# Patient Record
Sex: Male | Born: 1975 | Race: Black or African American | Hispanic: No | Marital: Married | Smoking: Current every day smoker
Health system: Southern US, Community
[De-identification: ages and names within clinical notes are randomized; demographics above are authoritative.]

## PROBLEM LIST (undated history)

## (undated) DIAGNOSIS — M549 Dorsalgia, unspecified: Secondary | ICD-10-CM

## (undated) DIAGNOSIS — J45909 Unspecified asthma, uncomplicated: Secondary | ICD-10-CM

## (undated) DIAGNOSIS — G8929 Other chronic pain: Secondary | ICD-10-CM

## (undated) HISTORY — PX: ABSCESS DRAINAGE: SHX1119

---

## 2006-11-16 ENCOUNTER — Emergency Department (HOSPITAL_COMMUNITY): Admission: EM | Admit: 2006-11-16 | Discharge: 2006-11-16 | Payer: Self-pay | Admitting: Emergency Medicine

## 2006-12-09 ENCOUNTER — Emergency Department (HOSPITAL_COMMUNITY): Admission: EM | Admit: 2006-12-09 | Discharge: 2006-12-09 | Payer: Self-pay | Admitting: Emergency Medicine

## 2006-12-19 ENCOUNTER — Emergency Department (HOSPITAL_COMMUNITY): Admission: EM | Admit: 2006-12-19 | Discharge: 2006-12-19 | Payer: Self-pay | Admitting: Emergency Medicine

## 2007-01-08 ENCOUNTER — Emergency Department (HOSPITAL_COMMUNITY): Admission: EM | Admit: 2007-01-08 | Discharge: 2007-01-08 | Payer: Self-pay | Admitting: Emergency Medicine

## 2007-01-16 ENCOUNTER — Emergency Department (HOSPITAL_COMMUNITY): Admission: EM | Admit: 2007-01-16 | Discharge: 2007-01-16 | Payer: Self-pay | Admitting: Emergency Medicine

## 2007-02-01 ENCOUNTER — Emergency Department (HOSPITAL_COMMUNITY): Admission: EM | Admit: 2007-02-01 | Discharge: 2007-02-01 | Payer: Self-pay | Admitting: Emergency Medicine

## 2007-02-07 ENCOUNTER — Emergency Department (HOSPITAL_COMMUNITY): Admission: EM | Admit: 2007-02-07 | Discharge: 2007-02-07 | Payer: Self-pay | Admitting: Emergency Medicine

## 2007-02-09 ENCOUNTER — Emergency Department (HOSPITAL_COMMUNITY): Admission: EM | Admit: 2007-02-09 | Discharge: 2007-02-09 | Payer: Self-pay | Admitting: Emergency Medicine

## 2007-02-12 ENCOUNTER — Emergency Department (HOSPITAL_COMMUNITY): Admission: EM | Admit: 2007-02-12 | Discharge: 2007-02-12 | Payer: Self-pay | Admitting: *Deleted

## 2007-03-08 ENCOUNTER — Emergency Department (HOSPITAL_COMMUNITY): Admission: EM | Admit: 2007-03-08 | Discharge: 2007-03-08 | Payer: Self-pay | Admitting: Emergency Medicine

## 2007-03-23 ENCOUNTER — Emergency Department (HOSPITAL_COMMUNITY): Admission: EM | Admit: 2007-03-23 | Discharge: 2007-03-23 | Payer: Self-pay | Admitting: Emergency Medicine

## 2007-04-17 ENCOUNTER — Emergency Department (HOSPITAL_COMMUNITY): Admission: EM | Admit: 2007-04-17 | Discharge: 2007-04-17 | Payer: Self-pay | Admitting: Emergency Medicine

## 2007-05-17 ENCOUNTER — Emergency Department (HOSPITAL_COMMUNITY): Admission: EM | Admit: 2007-05-17 | Discharge: 2007-05-17 | Payer: Self-pay | Admitting: Emergency Medicine

## 2007-05-30 ENCOUNTER — Emergency Department (HOSPITAL_COMMUNITY): Admission: EM | Admit: 2007-05-30 | Discharge: 2007-05-30 | Payer: Self-pay | Admitting: Emergency Medicine

## 2007-06-15 ENCOUNTER — Emergency Department (HOSPITAL_COMMUNITY): Admission: EM | Admit: 2007-06-15 | Discharge: 2007-06-15 | Payer: Self-pay | Admitting: Emergency Medicine

## 2007-07-06 ENCOUNTER — Emergency Department (HOSPITAL_COMMUNITY): Admission: EM | Admit: 2007-07-06 | Discharge: 2007-07-06 | Payer: Self-pay | Admitting: Emergency Medicine

## 2007-07-25 ENCOUNTER — Emergency Department (HOSPITAL_COMMUNITY): Admission: EM | Admit: 2007-07-25 | Discharge: 2007-07-25 | Payer: Self-pay | Admitting: Emergency Medicine

## 2007-08-26 ENCOUNTER — Emergency Department (HOSPITAL_COMMUNITY): Admission: EM | Admit: 2007-08-26 | Discharge: 2007-08-26 | Payer: Self-pay | Admitting: Emergency Medicine

## 2007-09-13 ENCOUNTER — Emergency Department (HOSPITAL_COMMUNITY): Admission: EM | Admit: 2007-09-13 | Discharge: 2007-09-13 | Payer: Self-pay | Admitting: Emergency Medicine

## 2008-05-09 ENCOUNTER — Emergency Department (HOSPITAL_COMMUNITY): Admission: EM | Admit: 2008-05-09 | Discharge: 2008-05-09 | Payer: Self-pay | Admitting: Emergency Medicine

## 2009-06-17 ENCOUNTER — Emergency Department (HOSPITAL_COMMUNITY): Admission: EM | Admit: 2009-06-17 | Discharge: 2009-06-17 | Payer: Self-pay | Admitting: Family Medicine

## 2010-01-15 ENCOUNTER — Emergency Department (HOSPITAL_COMMUNITY): Admission: EM | Admit: 2010-01-15 | Discharge: 2010-01-15 | Payer: Self-pay | Admitting: Emergency Medicine

## 2010-08-05 ENCOUNTER — Emergency Department (HOSPITAL_COMMUNITY)
Admission: EM | Admit: 2010-08-05 | Discharge: 2010-08-05 | Disposition: A | Discharge status: Home / Self Care | Payer: Self-pay | Attending: Emergency Medicine | Admitting: Emergency Medicine

## 2010-08-05 DIAGNOSIS — IMO0001 Reserved for inherently not codable concepts without codable children: Secondary | ICD-10-CM | POA: Insufficient documentation

## 2010-08-05 DIAGNOSIS — J020 Streptococcal pharyngitis: Secondary | ICD-10-CM | POA: Insufficient documentation

## 2010-12-02 ENCOUNTER — Inpatient Hospital Stay (INDEPENDENT_AMBULATORY_CARE_PROVIDER_SITE_OTHER)
Admission: RE | Admit: 2010-12-02 | Discharge: 2010-12-02 | Disposition: A | Discharge status: Home / Self Care | Payer: Self-pay | Source: Ambulatory Visit | Attending: Family Medicine | Admitting: Family Medicine

## 2010-12-02 DIAGNOSIS — K625 Hemorrhage of anus and rectum: Secondary | ICD-10-CM

## 2011-01-06 ENCOUNTER — Emergency Department (HOSPITAL_COMMUNITY)
Admission: EM | Admit: 2011-01-06 | Discharge: 2011-01-06 | Disposition: A | Discharge status: Home / Self Care | Payer: No Typology Code available for payment source | Attending: Emergency Medicine | Admitting: Emergency Medicine

## 2011-01-06 ENCOUNTER — Emergency Department (HOSPITAL_COMMUNITY): Payer: No Typology Code available for payment source

## 2011-01-06 DIAGNOSIS — M545 Low back pain, unspecified: Secondary | ICD-10-CM | POA: Insufficient documentation

## 2011-01-06 DIAGNOSIS — S335XXA Sprain of ligaments of lumbar spine, initial encounter: Secondary | ICD-10-CM | POA: Insufficient documentation

## 2012-03-02 ENCOUNTER — Encounter (HOSPITAL_COMMUNITY): Payer: Self-pay | Admitting: *Deleted

## 2012-03-02 ENCOUNTER — Emergency Department (HOSPITAL_COMMUNITY)
Admission: EM | Admit: 2012-03-02 | Discharge: 2012-03-02 | Disposition: A | Discharge status: Home / Self Care | Payer: Self-pay | Attending: Emergency Medicine | Admitting: Emergency Medicine

## 2012-03-02 DIAGNOSIS — F17200 Nicotine dependence, unspecified, uncomplicated: Secondary | ICD-10-CM | POA: Insufficient documentation

## 2012-03-02 DIAGNOSIS — G43909 Migraine, unspecified, not intractable, without status migrainosus: Secondary | ICD-10-CM | POA: Insufficient documentation

## 2012-03-02 HISTORY — DX: Unspecified asthma, uncomplicated: J45.909

## 2012-03-02 MED ORDER — PROCHLORPERAZINE MALEATE 10 MG PO TABS
10.0000 mg | ORAL_TABLET | Freq: Once | ORAL | Status: AC
  Administered 2012-03-02: 10 mg via ORAL
  Filled 2012-03-02: qty 1

## 2012-03-02 MED ORDER — PROCHLORPERAZINE MALEATE 10 MG PO TABS: 10.0000 mg | ORAL_TABLET | Freq: Two times a day (BID) | ORAL | Status: DC | PRN

## 2012-03-02 MED ORDER — DIPHENHYDRAMINE HCL 25 MG PO CAPS: 25.0000 mg | ORAL_CAPSULE | Freq: Four times a day (QID) | ORAL | Status: DC | PRN

## 2012-03-02 MED ORDER — DIPHENHYDRAMINE HCL 25 MG PO CAPS
25.0000 mg | ORAL_CAPSULE | Freq: Once | ORAL | Status: AC
  Administered 2012-03-02: 25 mg via ORAL
  Filled 2012-03-02: qty 1

## 2012-03-02 MED ORDER — IBUPROFEN 800 MG PO TABS
800.0000 mg | ORAL_TABLET | Freq: Once | ORAL | Status: AC
  Administered 2012-03-02: 800 mg via ORAL
  Filled 2012-03-02: qty 1

## 2012-03-02 NOTE — ED Notes (Signed)
Pt denies pain after medications. Sts he "feels much better." Pt asleep on stretcher upon RN entering room. Will inform EDP.

## 2012-03-02 NOTE — ED Provider Notes (Signed)
History     CSN: [id number]  Arrival date & time 03/02/12  7829   First MD Initiated Contact with Patient 03/02/12 [phone]      Chief Complaint  Patient presents with  . Headache    (Consider location/radiation/quality/duration/timing/severity/associated sxs/prior treatment) HPIJames D Crawford is a 36 y.o. male with a history of migraine headaches who presented to migraine since early this morning about approximately 3 AM, is currently 8/10, throbbing on the right forehead and behind the right thigh. Is been associated with photophobia, no nausea or vomiting. No neurologic deficits. No changes in vision or blurred vision. No chest pain, fevers or chills, shortness of breath. Patient says this feels like prior migraines. He has had increasing headaches this week.    Past Medical History  Diagnosis Date  . Asthma     History reviewed. No pertinent past surgical history.  No family history on file.  History  Substance Use Topics  . Smoking status: Current Every Day Smoker -- 2.0 packs/day for 7 years    Types: Cigarettes  . Smokeless tobacco: Not on file  . Alcohol Use: No      Review of Systems At least 10pt or greater review of systems completed and are negative except where specified in the HPI.  Allergies  Review of patient's allergies indicates no known allergies.  Home Medications   Current Outpatient Rx  Name  Route  Sig  Dispense  Refill  . ASPIRIN-ACETAMINOPHEN-CAFFEINE 250-250-65 MG PO TABS   Oral   Take 2 tablets by mouth every 6 (six) hours as needed.         . IBUPROFEN 800 MG PO TABS   Oral   Take 800 mg by mouth every 8 (eight) hours as needed. Pain           BP 148/95  Pulse 85  Temp 97.6 F (36.4 C) (Oral)  Resp 16  SpO2 98%  Physical Exam  PHYSICAL EXAM: VITAL SIGNS:  . Filed Vitals:   03/02/12 0915 03/02/12 1244  BP: 148/95 116/70  Pulse: 85 79  Temp: 97.6 F (36.4 C) 98.1 F (36.7 C)  TempSrc: Oral Oral  Resp: 16 20    SpO2: 98% 95%   CONSTITUTIONAL: Awake, oriented, appears non-toxic HENT: Atraumatic, normocephalic, oral mucosa pink and moist, airway patent. Nares patent without drainage. External ears normal. EYES: Conjunctiva clear, EOMI, PERRLA NECK: Trachea midline, non-tender, supple CARDIOVASCULAR: Normal heart rate, Normal rhythm, No murmurs, rubs, gallops PULMONARY/CHEST: Clear to auscultation, no rhonchi, wheezes, or rales. Symmetrical breath sounds. CHEST WALL: No lesions. Non-tender. ABDOMINAL: Non-distended, soft, non-tender - no rebound or guarding.  BS normal. NEUROLOGIC: HY:QMVHQI fields intact. PERRLA, EOMI.  Facial sensation equal to light touch bilaterally.  Good muscle bulk in the masseter muscle and good lateral movement of the jaw.  Facial expressions equal and good strength with smile/frown and puffed cheeks.  Hearing grossly intact to finger rub test.  Uvula, tongue are midline with no deviation. Symmetrical palate elevation.  Trapezius and SCM muscles are 5/5 strength bilaterally.   Strength: 5/5 strength flexors and extensors in the upper and lower extremities.  Grip strength 5/5. Sensation: Sensation intact distally to light touch EXTREMITIES: No clubbing, cyanosis, or edema SKIN: Warm, Dry, No erythema, No rash   ED Course  Procedures (including critical care time)  Labs Reviewed - No data to display No results found.   No diagnosis found.    MDM  Martin Crawford is a 36 y.o. male presents  with typical migraine headaches, however these did not respond to his typical migraine headache medicine-Excedrin. Treated the patient with Benadryl, Compazine, ibuprofen, reevaluated this patient's headache is completely gone. We'll discharge the patient home with Compazine as an abortive therapy.  Do not think this headache is related to any other severe intracranial abnormality such as subarachnoid hemorrhage or CNS infection-headache is most consistent with typical migraines with  this patient, no focal neurologic deficits resolved on therapy.  I explained the diagnosis and have given explicit precautions to return to the ER including any other new or worsening symptoms. The patient understands and accepts the medical plan as it's been dictated and I have answered their questions. Discharge instructions concerning home care and prescriptions have been given.  The patient is STABLE and is discharged to home in good condition.           Jones Skene, MD 03/02/12 1316

## 2012-03-02 NOTE — ED Notes (Signed)
Pt reports headache at 0300, took Excedrin, but medication wore off around 8 am. Headache came back. 4 headaches this week. Throbbing pain in right side of head 7/10. Pt denies blurry vision or dizziness.

## 2012-04-23 ENCOUNTER — Emergency Department (HOSPITAL_COMMUNITY)
Admission: EM | Admit: 2012-04-23 | Discharge: 2012-04-24 | Disposition: A | Discharge status: Home / Self Care | Payer: Self-pay | Attending: Emergency Medicine | Admitting: Emergency Medicine

## 2012-04-23 ENCOUNTER — Encounter (HOSPITAL_COMMUNITY): Payer: Self-pay | Admitting: Emergency Medicine

## 2012-04-23 DIAGNOSIS — R509 Fever, unspecified: Secondary | ICD-10-CM | POA: Insufficient documentation

## 2012-04-23 DIAGNOSIS — J111 Influenza due to unidentified influenza virus with other respiratory manifestations: Secondary | ICD-10-CM | POA: Insufficient documentation

## 2012-04-23 DIAGNOSIS — F17200 Nicotine dependence, unspecified, uncomplicated: Secondary | ICD-10-CM | POA: Insufficient documentation

## 2012-04-23 DIAGNOSIS — J45909 Unspecified asthma, uncomplicated: Secondary | ICD-10-CM | POA: Insufficient documentation

## 2012-04-23 DIAGNOSIS — IMO0001 Reserved for inherently not codable concepts without codable children: Secondary | ICD-10-CM | POA: Insufficient documentation

## 2012-04-23 DIAGNOSIS — J029 Acute pharyngitis, unspecified: Secondary | ICD-10-CM | POA: Insufficient documentation

## 2012-04-23 DIAGNOSIS — R079 Chest pain, unspecified: Secondary | ICD-10-CM | POA: Insufficient documentation

## 2012-04-23 MED ORDER — ACETAMINOPHEN 325 MG PO TABS
650.0000 mg | ORAL_TABLET | Freq: Once | ORAL | Status: AC
  Administered 2012-04-24: 650 mg via ORAL
  Filled 2012-04-23: qty 2

## 2012-04-23 NOTE — ED Notes (Signed)
Pt c/o nausea, sore throat, chills, body aches, cough, fever x 3 days.

## 2012-04-24 ENCOUNTER — Emergency Department (HOSPITAL_COMMUNITY): Payer: Self-pay

## 2012-04-24 MED ORDER — OSELTAMIVIR PHOSPHATE 75 MG PO CAPS
75.0000 mg | ORAL_CAPSULE | Freq: Once | ORAL | Status: AC
  Administered 2012-04-24: 75 mg via ORAL
  Filled 2012-04-24: qty 1

## 2012-04-24 MED ORDER — HYDROCOD POLST-CHLORPHEN POLST 10-8 MG/5ML PO LQCR
5.0000 mL | Freq: Once | ORAL | Status: AC
  Administered 2012-04-24: 5 mL via ORAL
  Filled 2012-04-24: qty 5

## 2012-04-24 MED ORDER — OSELTAMIVIR PHOSPHATE 75 MG PO CAPS: 75.0000 mg | ORAL_CAPSULE | Freq: Two times a day (BID) | ORAL | Status: DC

## 2012-04-24 MED ORDER — HYDROCOD POLST-CHLORPHEN POLST 10-8 MG/5ML PO LQCR: 5.0000 mL | Freq: Two times a day (BID) | ORAL | Status: DC

## 2012-04-24 MED ORDER — IBUPROFEN 200 MG PO TABS
600.0000 mg | ORAL_TABLET | Freq: Once | ORAL | Status: AC
  Administered 2012-04-24: 600 mg via ORAL
  Filled 2012-04-24: qty 3

## 2012-04-24 NOTE — ED Provider Notes (Signed)
History     CSN: [id number]  Arrival date & time 04/23/12  2329   First MD Initiated Contact with Patient 04/24/12 0005      Chief Complaint  Patient presents with  . Influenza    (Consider location/radiation/quality/duration/timing/severity/associated sxs/prior treatment) HPI Comments: 3 days of acute onset myalgia, cough, sore throat fever and chills has been taking OTC nyquil. Without relief   Patient is a 37 y.o. male presenting with flu symptoms. The history is provided by the patient.  Influenza This is a new problem. The current episode started in the past 7 days. The problem occurs constantly. The problem has been unchanged. Associated symptoms include chest pain, chills, coughing, a fever, myalgias and a sore throat. Pertinent negatives include no weakness. Nothing aggravates the symptoms. He has tried nothing for the symptoms. The treatment provided no relief.    Past Medical History  Diagnosis Date  . Asthma     History reviewed. No pertinent past surgical history.  No family history on file.  History  Substance Use Topics  . Smoking status: Current Every Day Smoker -- 2.0 packs/day for 7 years    Types: Cigarettes  . Smokeless tobacco: Not on file  . Alcohol Use: No      Review of Systems  Constitutional: Positive for fever and chills.  HENT: Positive for sore throat.   Respiratory: Positive for cough.   Cardiovascular: Positive for chest pain.  Genitourinary: Negative for dysuria.  Musculoskeletal: Positive for myalgias.  Neurological: Negative for weakness.    Allergies  Review of patient's allergies indicates no known allergies.  Home Medications   Current Outpatient Rx  Name  Route  Sig  Dispense  Refill  . ACETAMINOPHEN 500 MG PO TABS   Oral   Take 2,000 mg by mouth every 6 (six) hours as needed. Pain         . PSEUDOEPH-DOXYLAMINE-DM-APAP 60-12.09-17-998 MG/30ML PO LIQD   Oral   Take 30 mLs by mouth every 6 (six) hours as needed. Flu  symptoms         . HYDROCOD POLST-CPM POLST ER 10-8 MG/5ML PO LQCR   Oral   Take 5 mLs by mouth every 12 (twelve) hours.   140 mL   0   . OSELTAMIVIR PHOSPHATE 75 MG PO CAPS   Oral   Take 1 capsule (75 mg total) by mouth every 12 (twelve) hours.   10 capsule   0     BP 151/88  Pulse 113  Temp 99.6 F (37.6 C) (Oral)  Resp 22  Ht 5\' 9"  (1.753 m)  Wt 240 lb (108.863 kg)  BMI 35.44 kg/m2  SpO2 96%  Physical Exam  Constitutional: He is oriented to person, place, and time. He appears well-developed and well-nourished.  HENT:  Head: Normocephalic.  Mouth/Throat: Oropharynx is clear and moist.  Eyes: Pupils are equal, round, and reactive to light.  Neck: Normal range of motion.  Cardiovascular: Normal rate.   Pulmonary/Chest: Effort normal and breath sounds normal. No respiratory distress. He has no wheezes.  Abdominal: Soft. He exhibits no distension.  Musculoskeletal: Normal range of motion.  Neurological: He is alert and oriented to person, place, and time.  Skin: Skin is warm. No rash noted.    ED Course  Procedures (including critical care time)  Labs Reviewed - No data to display Dg Chest 2 View  04/24/2012  *RADIOLOGY REPORT*  Clinical Data: Shortness of breath, history asthma, smoking  CHEST - 2 VIEW  Comparison: None  Findings: Enlargement of cardiac silhouette. Slight pulmonary vascular congestion. Mediastinal contours normal. Peribronchial thickening without infiltrate, pleural effusion or pneumothorax. No acute osseous findings.  IMPRESSION: Enlargement of cardiac silhouette with pulmonary vascular congestion. Mild to moderate bronchitic changes.   Original Report Authenticated By: Ulyses Southward, M.D.      1. Influenza       MDM   influenza       Arman Filter, NP 04/24/12 1610  Arman Filter, NP 04/24/12 9604

## 2012-04-24 NOTE — ED Provider Notes (Signed)
Medical screening examination/treatment/procedure(s) were performed by non-physician practitioner and as supervising physician I was immediately available for consultation/collaboration.  Juliet Rude. Rubin Payor, MD 04/24/12 [phone]

## 2012-10-26 ENCOUNTER — Emergency Department (HOSPITAL_COMMUNITY)
Admission: EM | Admit: 2012-10-26 | Discharge: 2012-10-26 | Disposition: A | Discharge status: Home / Self Care | Payer: Self-pay | Attending: Emergency Medicine | Admitting: Emergency Medicine

## 2012-10-26 ENCOUNTER — Encounter (HOSPITAL_COMMUNITY): Payer: Self-pay

## 2012-10-26 DIAGNOSIS — Y9389 Activity, other specified: Secondary | ICD-10-CM | POA: Insufficient documentation

## 2012-10-26 DIAGNOSIS — M545 Low back pain, unspecified: Secondary | ICD-10-CM | POA: Insufficient documentation

## 2012-10-26 DIAGNOSIS — S0501XA Injury of conjunctiva and corneal abrasion without foreign body, right eye, initial encounter: Secondary | ICD-10-CM

## 2012-10-26 DIAGNOSIS — H5789 Other specified disorders of eye and adnexa: Secondary | ICD-10-CM | POA: Insufficient documentation

## 2012-10-26 DIAGNOSIS — S058X9A Other injuries of unspecified eye and orbit, initial encounter: Secondary | ICD-10-CM | POA: Insufficient documentation

## 2012-10-26 DIAGNOSIS — X58XXXA Exposure to other specified factors, initial encounter: Secondary | ICD-10-CM | POA: Insufficient documentation

## 2012-10-26 DIAGNOSIS — G8929 Other chronic pain: Secondary | ICD-10-CM | POA: Insufficient documentation

## 2012-10-26 DIAGNOSIS — F17200 Nicotine dependence, unspecified, uncomplicated: Secondary | ICD-10-CM | POA: Insufficient documentation

## 2012-10-26 DIAGNOSIS — J45909 Unspecified asthma, uncomplicated: Secondary | ICD-10-CM | POA: Insufficient documentation

## 2012-10-26 DIAGNOSIS — H53149 Visual discomfort, unspecified: Secondary | ICD-10-CM | POA: Insufficient documentation

## 2012-10-26 DIAGNOSIS — Y929 Unspecified place or not applicable: Secondary | ICD-10-CM | POA: Insufficient documentation

## 2012-10-26 HISTORY — DX: Other chronic pain: G89.29

## 2012-10-26 HISTORY — DX: Dorsalgia, unspecified: M54.9

## 2012-10-26 MED ORDER — PROPARACAINE HCL 0.5 % OP SOLN
1.0000 [drp] | Freq: Once | OPHTHALMIC | Status: AC
  Administered 2012-10-26: 1 [drp] via OPHTHALMIC
  Filled 2012-10-26: qty 15

## 2012-10-26 MED ORDER — CYCLOBENZAPRINE HCL 10 MG PO TABS: 10.0000 mg | ORAL_TABLET | Freq: Two times a day (BID) | ORAL | Status: DC | PRN

## 2012-10-26 MED ORDER — CYCLOBENZAPRINE HCL 10 MG PO TABS
10.0000 mg | ORAL_TABLET | Freq: Once | ORAL | Status: AC
  Administered 2012-10-26: 10 mg via ORAL
  Filled 2012-10-26: qty 1

## 2012-10-26 MED ORDER — FLUORESCEIN SODIUM 1 MG OP STRP
1.0000 | ORAL_STRIP | Freq: Once | OPHTHALMIC | Status: AC
  Administered 2012-10-26: 1 via OPHTHALMIC
  Filled 2012-10-26 (×2): qty 1

## 2012-10-26 MED ORDER — ERYTHROMYCIN 5 MG/GM OP OINT
1.0000 "application " | TOPICAL_OINTMENT | Freq: Once | OPHTHALMIC | Status: AC
  Administered 2012-10-26: 1 via OPHTHALMIC
  Filled 2012-10-26: qty 3.5

## 2012-10-26 NOTE — ED Provider Notes (Signed)
Medical screening examination/treatment/procedure(s) were performed by non-physician practitioner and as supervising physician I was immediately available for consultation/collaboration.  Donnetta Hutching, MD 10/26/12 [phone]

## 2012-10-26 NOTE — Progress Notes (Signed)
[facility] has see patient and provided him with a oc application.

## 2012-10-26 NOTE — ED Provider Notes (Signed)
History    CSN: [id number] Arrival date & time 10/26/12  0808  First MD Initiated Contact with Patient 10/26/12 0840     Chief Complaint  Patient presents with  . Eye Problem  . Back Pain   (Consider location/radiation/quality/duration/timing/severity/associated sxs/prior Treatment) HPI  Martin Crawford is a 37 y.o. male complaining of right eye irritation, watering and photophobia worsening over the course last week. Patient denies decrease in visual acuity, purulent drainage, waking with eyes crusted shut. He states that he can't remember any specific trauma to the eye however he was fishing on the date he first noticed it. Patient also endorses a exacerbation of his chronic low back pain. He denies weakness, numbness, paresthesia, fever, history of IV drug use or cancer, change in bowel or bladder habits.   Past Medical History  Diagnosis Date  . Asthma   . Chronic back pain    History reviewed. No pertinent past surgical history. No family history on file. History  Substance Use Topics  . Smoking status: Current Every Day Smoker -- 2.00 packs/day for 7 years    Types: Cigarettes  . Smokeless tobacco: Not on file  . Alcohol Use: No    Review of Systems  Constitutional:       Negative except as described in HPI  HENT:       Negative except as described in HPI  Respiratory:       Negative except as described in HPI  Cardiovascular:       Negative except as described in HPI  Gastrointestinal:       Negative except as described in HPI  Genitourinary:       Negative except as described in HPI  Musculoskeletal:       Negative except as described in HPI  Skin:       Negative except as described in HPI  Neurological:       Negative except as described in HPI    Allergies  Review of patient's allergies indicates no known allergies.  Home Medications  No current outpatient prescriptions on file. BP 125/85  Pulse 91  Temp(Src) 98.4 F (36.9 C) (Oral)  Resp 16   SpO2 95% Physical Exam  Nursing note and vitals reviewed. Constitutional: He is oriented to person, place, and time. He appears well-developed and well-nourished. No distress.  HENT:  Head: Normocephalic.  Mouth/Throat: Oropharynx is clear and moist.  Eyes: Conjunctivae and EOM are normal. Pupils are equal, round, and reactive to light. No foreign bodies found. Right eye exhibits discharge. Right eye exhibits no chemosis, no exudate and no hordeolum. No foreign body present in the right eye. No scleral icterus. Right eye exhibits normal extraocular motion. Left eye exhibits normal extraocular motion. Right pupil is reactive.    OS 20/25 OD 20/25  OA 20/25  Patient's right eye is injected, there is clear discharge. There is a corneal abrasion at approximately 4:00 on the right eye.  Neck: Normal range of motion.  Cardiovascular: Normal rate, regular rhythm and intact distal pulses.   Pulmonary/Chest: Effort normal and breath sounds normal. No stridor. No respiratory distress. He has no wheezes. He has no rales. He exhibits no tenderness.  Abdominal: Soft. Bowel sounds are normal. He exhibits no distension and no mass. There is no tenderness. There is no rebound and no guarding.  Musculoskeletal: Normal range of motion.  Neurological: He is alert and oriented to person, place, and time.  Skin: Skin is warm.  Psychiatric: He  has a normal mood and affect.    ED Course  Procedures (including critical care time) Labs Reviewed - No data to display No results found.  1. Corneal abrasion, right, initial encounter   2. Low back pain     MDM   Filed Vitals:   10/26/12 0830  BP: 125/85  Pulse: 91  Temp: 98.4 F (36.9 C)  TempSrc: Oral  Resp: 16  SpO2: 95%     Martin Crawford is a 37 y.o. male with exacerbation of chronic low back pain and right sided corneal abrasion. Patient is given erythromycin ointment. Muscle relaxer for the low back pain. He is asked to follow with  ophthalmology for a checkup.  Medications  fluorescein ophthalmic strip 1 strip (not administered)  erythromycin ophthalmic ointment 1 application (not administered)  proparacaine (ALCAINE) 0.5 % ophthalmic solution 1 drop (1 drop Right Eye Given 10/26/12 0917)  cyclobenzaprine (FLEXERIL) tablet 10 mg (10 mg Oral Given 10/26/12 0916)    Pt is hemodynamically stable, appropriate for, and amenable to discharge at this time. Pt verbalized understanding and agrees with care plan. Outpatient follow-up and specific return precautions discussed.    New Prescriptions   CYCLOBENZAPRINE (FLEXERIL) 10 MG TABLET    Take 1 tablet (10 mg total) by mouth 2 (two) times daily as needed for muscle spasms.     Wynetta Emery, PA-C 10/26/12 1003

## 2012-10-26 NOTE — ED Notes (Signed)
Pt c/o redness to rt eye and back pain x1.5 wks, no new injury

## 2012-11-25 ENCOUNTER — Emergency Department (INDEPENDENT_AMBULATORY_CARE_PROVIDER_SITE_OTHER): Payer: Medicaid Other

## 2012-11-25 ENCOUNTER — Emergency Department (INDEPENDENT_AMBULATORY_CARE_PROVIDER_SITE_OTHER)
Admission: EM | Admit: 2012-11-25 | Discharge: 2012-11-25 | Disposition: A | Discharge status: Home / Self Care | Payer: Self-pay | Source: Home / Self Care | Attending: Emergency Medicine | Admitting: Emergency Medicine

## 2012-11-25 ENCOUNTER — Encounter (HOSPITAL_COMMUNITY): Payer: Self-pay | Admitting: Emergency Medicine

## 2012-11-25 DIAGNOSIS — IMO0002 Reserved for concepts with insufficient information to code with codable children: Secondary | ICD-10-CM

## 2012-11-25 DIAGNOSIS — S39012A Strain of muscle, fascia and tendon of lower back, initial encounter: Secondary | ICD-10-CM

## 2012-11-25 DIAGNOSIS — S335XXA Sprain of ligaments of lumbar spine, initial encounter: Secondary | ICD-10-CM

## 2012-11-25 DIAGNOSIS — S76011A Strain of muscle, fascia and tendon of right hip, initial encounter: Secondary | ICD-10-CM

## 2012-11-25 MED ORDER — METHOCARBAMOL 500 MG PO TABS: 500.0000 mg | ORAL_TABLET | Freq: Three times a day (TID) | ORAL | Status: DC

## 2012-11-25 MED ORDER — HYDROCODONE-ACETAMINOPHEN 5-325 MG PO TABS
ORAL_TABLET | ORAL | Status: AC
  Filled 2012-11-25: qty 1

## 2012-11-25 MED ORDER — MELOXICAM 15 MG PO TABS: 15.0000 mg | ORAL_TABLET | Freq: Every day | ORAL | Status: DC

## 2012-11-25 MED ORDER — HYDROCODONE-ACETAMINOPHEN 5-325 MG PO TABS
1.0000 | ORAL_TABLET | Freq: Once | ORAL | Status: AC
  Administered 2012-11-25: 1 via ORAL

## 2012-11-25 MED ORDER — HYDROCODONE-ACETAMINOPHEN 5-325 MG PO TABS: ORAL_TABLET | ORAL | Status: DC

## 2012-11-25 MED ORDER — IBUPROFEN 800 MG PO TABS
800.0000 mg | ORAL_TABLET | Freq: Once | ORAL | Status: AC
  Administered 2012-11-25: 800 mg via ORAL

## 2012-11-25 MED ORDER — IBUPROFEN 800 MG PO TABS
ORAL_TABLET | ORAL | Status: AC
  Filled 2012-11-25: qty 1
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
## 2013-07-21 ENCOUNTER — Encounter (HOSPITAL_COMMUNITY): Payer: Self-pay | Admitting: Emergency Medicine

## 2013-07-21 ENCOUNTER — Emergency Department (HOSPITAL_COMMUNITY)
Admission: EM | Admit: 2013-07-21 | Discharge: 2013-07-22 | Disposition: A | Discharge status: Home / Self Care | Payer: Medicaid Other | Attending: Emergency Medicine | Admitting: Emergency Medicine

## 2013-07-21 DIAGNOSIS — J45909 Unspecified asthma, uncomplicated: Secondary | ICD-10-CM | POA: Insufficient documentation

## 2013-07-21 DIAGNOSIS — S63501A Unspecified sprain of right wrist, initial encounter: Secondary | ICD-10-CM

## 2013-07-21 DIAGNOSIS — Y929 Unspecified place or not applicable: Secondary | ICD-10-CM | POA: Insufficient documentation

## 2013-07-21 DIAGNOSIS — Z791 Long term (current) use of non-steroidal anti-inflammatories (NSAID): Secondary | ICD-10-CM | POA: Insufficient documentation

## 2013-07-21 DIAGNOSIS — Y9389 Activity, other specified: Secondary | ICD-10-CM | POA: Insufficient documentation

## 2013-07-21 DIAGNOSIS — IMO0002 Reserved for concepts with insufficient information to code with codable children: Secondary | ICD-10-CM | POA: Insufficient documentation

## 2013-07-21 DIAGNOSIS — G8929 Other chronic pain: Secondary | ICD-10-CM | POA: Insufficient documentation

## 2013-07-21 DIAGNOSIS — S63509A Unspecified sprain of unspecified wrist, initial encounter: Secondary | ICD-10-CM | POA: Insufficient documentation

## 2013-07-21 DIAGNOSIS — F17200 Nicotine dependence, unspecified, uncomplicated: Secondary | ICD-10-CM | POA: Insufficient documentation

## 2013-07-21 DIAGNOSIS — Z79899 Other long term (current) drug therapy: Secondary | ICD-10-CM | POA: Insufficient documentation

## 2013-07-21 NOTE — ED Notes (Signed)
Pt c/o R hand pain. Pt states he was using a punching bag and "hit it the wrong way.". Pt has slight swelling to R hand. ROM decreased due to pain.

## 2013-07-21 NOTE — ED Provider Notes (Signed)
CSN: [id number]     Arrival date & time 07/21/13  2342 History  This chart was scribed for non-physician practitioner Earley FavorGail Chalee Hirota, NP working with Sunnie NielsenBrian Opitz, MD by Martin Crawford, ED Scribe. This patient was seen in room WTR9/WTR9 and the patient's care was started at 11:57 PM    Chief Complaint  Patient presents with  . Hand Injury   Patient is a 38 y.o. male presenting with hand injury. The history is provided by the patient. No language interpreter was used.  Hand Injury  HPI Comments: Martin Crawford is a 38 y.o. male who presents to the Emergency Department complaining of ongoing R wrist pain that began today.  Pt reports punching a punching bag today and states he punched "it wrong". He reports having limited ROM secondary to pain . Pt denies taking any OTC medications. Pt denies any other injuries.  Past Medical History  Diagnosis Date  . Asthma   . Chronic back pain    History reviewed. No pertinent past surgical history. No family history on file. History  Substance Use Topics  . Smoking status: Current Every Day Smoker -- 2.00 packs/day for 7 years    Types: Cigarettes  . Smokeless tobacco: Not on file  . Alcohol Use: No    Review of Systems  Skin: Negative for color change and wound.  All other systems reviewed and are negative.   Allergies  Review of patient's allergies indicates no known allergies.  Home Medications   Current Outpatient Rx  Name  Route  Sig  Dispense  Refill  . cyclobenzaprine (FLEXERIL) 10 MG tablet   Oral   Take 1 tablet (10 mg total) by mouth 2 (two) times daily as needed for muscle spasms.   20 tablet   0   . HYDROcodone-acetaminophen (NORCO/VICODIN) 5-325 MG per tablet      1 to 2 tabs every 4 to 6 hours as needed for pain.   20 tablet   0   . ibuprofen (ADVIL,MOTRIN) 600 MG tablet   Oral   Take 1 tablet (600 mg total) by mouth every 6 (six) hours as needed.   30 tablet   0   . meloxicam (MOBIC) 15 MG tablet  Oral   Take 1 tablet (15 mg total) by mouth daily.   15 tablet   0   . methocarbamol (ROBAXIN) 500 MG tablet   Oral   Take 1 tablet (500 mg total) by mouth 3 (three) times daily.   30 tablet   0    BP 144/82  Pulse 95  Temp(Src) 98 F (36.7 C) (Oral)  Resp 16  SpO2 97%  Physical Exam  Nursing note and vitals reviewed. Constitutional: He is oriented to person, place, and time. He appears well-developed and well-nourished. No distress.  HENT:  Head: Normocephalic and atraumatic.  Eyes: EOM are normal.  Neck: Neck supple. No tracheal deviation present.  Cardiovascular: Normal rate.   Pulmonary/Chest: Effort normal. No respiratory distress.  Musculoskeletal: Normal range of motion.  Swelling over the distal medial R wrist. Full ROM. Good cap refill. Strong pulses.  Neurological: He is alert and oriented to person, place, and time.  Skin: Skin is warm and dry.  Psychiatric: He has a normal mood and affect. His behavior is normal.    ED Course  Procedures  DIAGNOSTIC STUDIES: Oxygen Saturation is 97% on RA, normal by my interpretation.    COORDINATION OF CARE: 11:59 PM-Discussed treatment plan which includes X-ray and Toradol  shot while in ED. Pt agreed to plan.   Labs Review Labs Reviewed - No data to display Imaging Review Dg Wrist Complete Right  07/22/2013   CLINICAL DATA:  Punching injury while working out.  EXAM: RIGHT WRIST - COMPLETE 3+ VIEW  COMPARISON:  None available for comparison at time of study interpretation.  FINDINGS: No acute fracture deformity or dislocation. Joint space intact without erosions. No destructive bony lesions. Soft tissue planes are not suspicious.  IMPRESSION: Negative.   Electronically Signed   By: Awilda Metro   On: 07/22/2013 00:35     EKG Interpretation None      MDM  Patient's x-ray.  There is no fracture or subluxation.  Patient has been placed in a cock-up splint followup with Dr. Margarite Gouge a lot, as, needed.  He's also been  given a prescription for ibuprofen, I would like you to take a regular basis for the next several days Final diagnoses:  Right wrist sprain      I personally performed the services described in this documentation, which was scribed in my presence. The recorded information has been reviewed and is accurate.   Arman Filter, NP 07/22/13 [phone]

## 2013-07-22 ENCOUNTER — Emergency Department (HOSPITAL_COMMUNITY): Payer: Medicaid Other

## 2013-07-22 MED ORDER — KETOROLAC TROMETHAMINE 30 MG/ML IJ SOLN
30.0000 mg | Freq: Once | INTRAMUSCULAR | Status: AC
  Administered 2013-07-22: 30 mg via INTRAMUSCULAR
  Filled 2013-07-22: qty 1

## 2013-07-22 MED ORDER — IBUPROFEN 600 MG PO TABS: 600.0000 mg | ORAL_TABLET | Freq: Four times a day (QID) | ORAL | Status: DC | PRN

## 2013-07-22 NOTE — Discharge Instructions (Signed)
Joint Sprain A sprain is a tear or stretch in the ligaments that hold a joint together. Severe sprains may need as long as 3-6 weeks of immobilization and/or exercises to heal completely. Sprained joints should be rested and protected. If not, they can become unstable and prone to re-injury. Proper treatment can reduce your pain, shorten the period of disability, and reduce the risk of repeated injuries. TREATMENT   Rest and elevate the injured joint to reduce pain and swelling.  Apply ice packs to the injury for 20-30 minutes every 2-3 hours for the next 2-3 days.  Keep the injury wrapped in a compression bandage or splint as long as the joint is painful or as instructed by your caregiver.  Do not use the injured joint until it is completely healed to prevent re-injury and chronic instability. Follow the instructions of your caregiver.  Long-term sprain management may require exercises and/or treatment by a physical therapist. Taping or special braces may help stabilize the joint until it is completely better. SEEK MEDICAL CARE IF:   You develop increased pain or swelling of the joint.  You develop increasing redness and warmth of the joint.  You develop a fever.  It becomes stiff.  Your hand or foot gets cold or numb. Document Released: 05/15/2004 Document Revised: 06/30/2011 Document Reviewed: 04/24/2008 [facility] Patient Information 2014 [place], [place]. Your x-ray is negative.  Please wear the splint for comfort for the next several days.  Take ibuprofen on a regular basis

## 2013-07-22 NOTE — ED Provider Notes (Signed)
Medical screening examination/treatment/procedure(s) were performed by non-physician practitioner and as supervising physician I was immediately available for consultation/collaboration.   Irving Bloor, MD 07/22/13 2302 

## 2013-08-01 ENCOUNTER — Encounter (HOSPITAL_COMMUNITY): Payer: Self-pay | Admitting: Emergency Medicine

## 2013-08-01 ENCOUNTER — Emergency Department (HOSPITAL_COMMUNITY)
Admission: EM | Admit: 2013-08-01 | Discharge: 2013-08-02 | Disposition: A | Discharge status: Home / Self Care | Payer: Medicaid Other | Attending: Emergency Medicine | Admitting: Emergency Medicine

## 2013-08-01 DIAGNOSIS — R000 Tachycardia, unspecified: Secondary | ICD-10-CM | POA: Insufficient documentation

## 2013-08-01 DIAGNOSIS — G8929 Other chronic pain: Secondary | ICD-10-CM | POA: Insufficient documentation

## 2013-08-01 DIAGNOSIS — F17200 Nicotine dependence, unspecified, uncomplicated: Secondary | ICD-10-CM | POA: Insufficient documentation

## 2013-08-01 DIAGNOSIS — L0501 Pilonidal cyst with abscess: Secondary | ICD-10-CM | POA: Insufficient documentation

## 2013-08-01 DIAGNOSIS — L0591 Pilonidal cyst without abscess: Secondary | ICD-10-CM

## 2013-08-01 DIAGNOSIS — J45909 Unspecified asthma, uncomplicated: Secondary | ICD-10-CM | POA: Insufficient documentation

## 2013-08-01 LAB — BASIC METABOLIC PANEL
BUN: 12 mg/dL (ref 6–23)
CHLORIDE: 96 meq/L (ref 96–112)
CO2: 22 meq/L (ref 19–32)
Calcium: 9.7 mg/dL (ref 8.4–10.5)
Creatinine, Ser: 1.06 mg/dL (ref 0.50–1.35)
GFR calc Af Amer: >90 mL/min (ref >90)
GFR calc non Af Amer: 89 mL/min — ABNORMAL LOW (ref >90)
GLUCOSE: 106 mg/dL — AB (ref 70–99)
Potassium: 3.8 mEq/L (ref 3.7–5.3)
Sodium: 134 mEq/L — ABNORMAL LOW (ref 137–147)

## 2013-08-01 LAB — CBC WITH DIFFERENTIAL/PLATELET
Basophils Absolute: 0 10*3/uL (ref 0.0–0.1)
Basophils Relative: 0 % (ref 0–1)
EOS PCT: 0 % (ref 0–5)
Eosinophils Absolute: 0 10*3/uL (ref 0.0–0.7)
HCT: 42.2 % (ref 39.0–52.0)
Hemoglobin: 15.4 g/dL (ref 13.0–17.0)
LYMPHS ABS: 2.4 10*3/uL (ref 0.7–4.0)
Lymphocytes Relative: 7 % — ABNORMAL LOW (ref 12–46)
MCH: 22.1 pg — AB (ref 26.0–34.0)
MCHC: 36.5 g/dL — ABNORMAL HIGH (ref 30.0–36.0)
MCV: 60.5 fL — AB (ref 78.0–100.0)
MONO ABS: 2.1 10*3/uL — AB (ref 0.1–1.0)
Monocytes Relative: 6 % (ref 3–12)
Neutro Abs: 29.9 10*3/uL — ABNORMAL HIGH (ref 1.7–7.7)
Neutrophils Relative %: 87 % — ABNORMAL HIGH (ref 43–77)
PLATELETS: 179 10*3/uL (ref 150–400)
RBC: 6.98 MIL/uL — AB (ref 4.22–5.81)
RDW: 16 % — AB (ref 11.5–15.5)
WBC: 34.4 10*3/uL — ABNORMAL HIGH (ref 4.0–10.5)

## 2013-08-01 LAB — I-STAT CG4 LACTIC ACID, ED: Lactic Acid, Venous: 1.29 mmol/L (ref 0.5–2.2)

## 2013-08-01 MED ORDER — SODIUM CHLORIDE 0.9 % IV SOLN
1000.0000 mL | Freq: Once | INTRAVENOUS | Status: AC
  Administered 2013-08-01: 1000 mL via INTRAVENOUS

## 2013-08-01 MED ORDER — SODIUM CHLORIDE 0.9 % IV SOLN
1000.0000 mL | INTRAVENOUS | Status: DC
  Administered 2013-08-02: 1000 mL via INTRAVENOUS

## 2013-08-01 MED ORDER — IBUPROFEN 800 MG PO TABS
800.0000 mg | ORAL_TABLET | Freq: Once | ORAL | Status: AC
  Administered 2013-08-01: 800 mg via ORAL
  Filled 2013-08-01: qty 1

## 2013-08-01 MED ORDER — SODIUM CHLORIDE 0.9 % IV BOLUS (SEPSIS): 1000.0000 mL | Freq: Once | INTRAVENOUS | Status: DC

## 2013-08-01 NOTE — ED Notes (Signed)
Lactic Acid given to Dr. Horton. 

## 2013-08-01 NOTE — ED Notes (Signed)
Patient is alert and oriented x3.  He is complaining of abscess that is between his buttocks. He states that it started hurting last Thursday.  Patient has history this issue.  Currently he has a  Temp of 101.7 on arrival to the ED.

## 2013-08-02 ENCOUNTER — Emergency Department (HOSPITAL_COMMUNITY): Payer: Medicaid Other

## 2013-08-02 ENCOUNTER — Encounter (HOSPITAL_COMMUNITY): Payer: Self-pay

## 2013-08-02 LAB — URINALYSIS, ROUTINE W REFLEX MICROSCOPIC
BILIRUBIN URINE: NEGATIVE
GLUCOSE, UA: NEGATIVE mg/dL
HGB URINE DIPSTICK: NEGATIVE
KETONES UR: NEGATIVE mg/dL
Leukocytes, UA: NEGATIVE
Nitrite: NEGATIVE
PH: 6 (ref 5.0–8.0)
Protein, ur: NEGATIVE mg/dL
Specific Gravity, Urine: 1.01 (ref 1.005–1.030)
Urobilinogen, UA: 2 mg/dL — ABNORMAL HIGH (ref 0.0–1.0)

## 2013-08-02 MED ORDER — CLINDAMYCIN HCL 150 MG PO CAPS: 300.0000 mg | ORAL_CAPSULE | Freq: Four times a day (QID) | ORAL | Status: DC

## 2013-08-02 MED ORDER — HYDROMORPHONE HCL PF 1 MG/ML IJ SOLN
1.0000 mg | Freq: Once | INTRAMUSCULAR | Status: AC
Start: 2013-08-02 — End: 2013-08-02
  Administered 2013-08-02: 1 mg via INTRAVENOUS
  Filled 2013-08-02: qty 1

## 2013-08-02 MED ORDER — IOHEXOL 300 MG/ML  SOLN
100.0000 mL | Freq: Once | INTRAMUSCULAR | Status: AC | PRN
  Administered 2013-08-02: 100 mL via INTRAVENOUS

## 2013-08-02 MED ORDER — CLINDAMYCIN PHOSPHATE 600 MG/50ML IV SOLN
600.0000 mg | Freq: Once | INTRAVENOUS | Status: AC
  Administered 2013-08-02: 600 mg via INTRAVENOUS
  Filled 2013-08-02: qty 50

## 2013-08-02 MED ORDER — OXYCODONE-ACETAMINOPHEN 5-325 MG PO TABS: 1.0000 | ORAL_TABLET | Freq: Four times a day (QID) | ORAL | Status: DC | PRN

## 2013-08-02 NOTE — ED Provider Notes (Signed)
CSN: [id number]     Arrival date & time 08/01/13  2206 History   First MD Initiated Contact with Patient 08/01/13 2322     Chief Complaint  Patient presents with  . Abscess     (Consider location/radiation/quality/duration/timing/severity/associated sxs/prior Treatment) HPI  This is a 77 [place] who presents with buttock abscess. 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 Patient states that he noted 3-4 days ago increasing swelling and tenderness just above his gluteal cleft. Over last day and a half he reports fevers at home to 101. Also reports cough. He denies any headache, neck stiffness, shortness of breath, chest pain, abdominal pain, vomiting or diarrhea. He denies any sick contacts. Patient currently rates his pain a 10 out of 10. He has not noted any spontaneous drainage at this time.  Of note, patient noted to be febrile to 101.7 in triage and tachycardic.  Past Medical History  Diagnosis Date  . Asthma   . Chronic back pain    History reviewed. No pertinent past surgical history. History reviewed. No pertinent family history. History  Substance Use Topics  . Smoking status: Current Every Day Smoker -- 2.00 packs/day for 7 years    Types: Cigarettes  . Smokeless tobacco: Not on file  . Alcohol Use: Yes    Review of Systems  Constitutional: Positive for fever and chills.  Respiratory: Positive for cough. Negative for chest tightness and shortness of breath.   Cardiovascular: Negative.  Negative for chest pain.  Gastrointestinal: Negative.  Negative for nausea, vomiting, abdominal pain and diarrhea.  Genitourinary: Negative.  Negative for dysuria.  Musculoskeletal: Negative for back pain.  Skin: Negative for rash.       Abscess  Neurological: Negative for headaches.  All other systems reviewed and are negative.     Allergies  Review of patient's allergies indicates no known allergies.  Home Medications   Current Outpatient Rx   Name  Route  Sig  Dispense  Refill  . ibuprofen (ADVIL,MOTRIN) 600 MG tablet   Oral   Take 1 tablet (600 mg total) by mouth every 6 (six) hours as needed.   30 tablet   0   . clindamycin (CLEOCIN) 150 MG capsule   Oral   Take 2 capsules (300 mg total) by mouth 4 (four) times daily.   28 capsule   0   . oxyCODONE-acetaminophen (PERCOCET/ROXICET) 5-325 MG per tablet   Oral   Take 1 tablet by mouth every 6 (six) hours as needed for severe pain.   20 tablet   0    BP 127/76  Pulse 94  Temp(Src) 97.6 F (36.4 C) (Oral)  Resp 29  SpO2 94% Physical Exam  Nursing note and vitals reviewed. Constitutional: He is oriented to person, place, and time.  Ill-appearing, nontoxic  HENT:  Head: Normocephalic and atraumatic.  Mouth/Throat: Oropharynx is clear and moist.  Eyes: Pupils are equal, round, and reactive to light.  Neck: Neck supple.  Cardiovascular: Regular rhythm and normal heart sounds.   No murmur heard. Tachycardia  Pulmonary/Chest: Effort normal and breath sounds normal. No respiratory distress. He has no wheezes. He has no rales.  Abdominal: Soft. Bowel sounds are normal. There is no tenderness. There is no rebound.  Musculoskeletal: He exhibits no edema.  Lymphadenopathy:    He has no cervical adenopathy.  Neurological: He is alert and oriented to person, place, and time.  Skin: Skin is warm and dry.  Induration noted about the upper gluteal cleft  with tenderness to palpation, no significant erythema noted  Psychiatric: He has a normal mood and affect.    ED Course  INCISION AND DRAINAGE Date/Time: 08/02/2013 3:20 AM Performed by: Ross MarcusHORTON, Lita Flynn, F Authorized by: Ross MarcusHORTON, Jaslyn Bansal, F Consent: Verbal consent obtained. Risks and benefits: risks, benefits and alternatives were discussed Consent given by: patient Patient identity confirmed: verbally with patient Type: pilonidal cyst Location: Buttock. Anesthesia: local infiltration Local anesthetic: lidocaine  1% with epinephrine Anesthetic total: 10 ml Patient sedated: no Scalpel size: 11 Incision type: single straight Complexity: complex Drainage: purulent Drainage amount: copious Wound treatment: wound left open Packing material: 1/2 in iodoform gauze Patient tolerance: Patient tolerated the procedure well with no immediate complications.   (including critical care time) Labs Review Labs Reviewed  CBC WITH DIFFERENTIAL - Abnormal; Notable for the following:    WBC 34.4 (*)    RBC 6.98 (*)    MCV 60.5 (*)    MCH 22.1 (*)    MCHC 36.5 (*)    RDW 16.0 (*)    Neutrophils Relative % 87 (*)    Lymphocytes Relative 7 (*)    Neutro Abs 29.9 (*)    Monocytes Absolute 2.1 (*)    All other components within normal limits  BASIC METABOLIC PANEL - Abnormal; Notable for the following:    Sodium 134 (*)    Glucose, Bld 106 (*)    GFR calc non Af Amer 89 (*)    All other components within normal limits  URINALYSIS, ROUTINE W REFLEX MICROSCOPIC - Abnormal; Notable for the following:    Urobilinogen, UA 2.0 (*)    All other components within normal limits  CULTURE, BLOOD (ROUTINE X 2)  CULTURE, BLOOD (ROUTINE X 2)  URINE CULTURE  I-STAT CG4 LACTIC ACID, ED   Imaging Review Ct Pelvis W Contrast  08/02/2013   CLINICAL DATA:  Buttock abscess  EXAM: CT PELVIS WITH CONTRAST  TECHNIQUE: Multidetector CT imaging of the pelvis was performed using the standard protocol following the bolus administration of intravenous contrast.  CONTRAST:  100mL OMNIPAQUE IOHEXOL 300 MG/ML  SOLN  COMPARISON:  None  FINDINGS: There is a 2.6 x 5.4 cm complex fluid collection with surrounding hazy inflammatory changes within the subcutaneous fat posteriorly overlying the sacrum with a small component inferiorly extending to the sacral cortex without destruction.  There is no other focal fluid collection. There is no hematoma or soft tissue mass. There is mild dilatation of the left distal ureter. The bladder is normal.  There is no pelvic lymphadenopathy. There is no pelvic free fluid. The visualized bowel is normal in caliber. There is no lytic or blastic osseous lesion.  IMPRESSION: 1. 2.6 x 5.4 cm abscess in the posterior subcutaneous fat overlying the sacrum.   Electronically Signed   By: Elige KoHetal  Patel   On: 08/02/2013 01:05   Dg Chest Portable 1 View  08/02/2013   CLINICAL DATA:  Shortness of breath  EXAM: PORTABLE CHEST - 1 VIEW  COMPARISON:  DG CHEST 2 VIEW dated 04/24/2012  FINDINGS: The heart size and mediastinal contours are within normal limits. Both lungs are clear. The visualized skeletal structures are unremarkable.  IMPRESSION: No active disease.   Electronically Signed   By: Elige KoHetal  Patel   On: 08/02/2013 00:16     EKG Interpretation None      MDM   Final diagnoses:  Infected pilonidal cyst    Patient presents with what appears to be pilonidal abscess. No obvious cellulitis. Patient  does have a fever and appears to have a SIRS. Chest x-ray and urinalysis without evidence of infection. White count is 35. Given high white count, will obtain CT scan.  CT scan with contrast shows a 2.6 x 5.4 cm abscess. Lactate is normal. Abscess was drained at the bedside with copious foul-smelling pus.  Given patient's systemic response, he was given clindamycin. Suspect abscess is the source. Abscess was packed. Wife was given wound care instructions and packing instructions. Patient will be referred to surgery clinic. He will be given clindamycin. Patient was given strict return precautions.  After history, exam, and medical workup I feel the patient has been appropriately medically screened and is safe for discharge home. Pertinent diagnoses were discussed with the patient. Patient was given return precautions.    Shon Batonourtney F Tamanna Whitson, MD 08/02/13 [phone]

## 2013-08-02 NOTE — Discharge Instructions (Signed)
Pilonidal Cyst, Care After A pilonidal cyst occurs when hairs get trapped (ingrown) beneath the skin in the crease between the buttocks over your sacrum (the bone under that crease). Pilonidal cysts are most common in young men with a lot of body hair. When the cyst breaks(ruptured) or leaks, fluid from the cyst may cause burning and itching. If the cyst becomes infected, it causes a painful swelling filled with pus (abscess). The pus and trapped hairs need to be removed (often by lancing) so that the infection can heal. The word pilonidal means hair nest. HOME CARE INSTRUCTIONS If the pilonidal sinus was NOT DRAINING OR LANCED:  Keep the area clean and dry. Bathe or shower daily. Wash the area well with a germ-killing soap. Hot tub baths may help prevent infection. Dry the area well with a towel.  Avoid tight clothing in order to keep area as moisture-free as possible.  Keep area between buttocks as free from hair as possible. A depilatory may be used.  Take antibiotics as directed.  Only take over-the-counter or prescription medicines for pain, discomfort, or fever as directed by your caregiver. If the cyst WAS INFECTED AND NEEDED TO BE DRAINED:  Your caregiver may have packed the wound with gauze to keep the wound open. This allows the wound to heal from the inside outward and continue to drain.  Return as directed for a wound check.  If you take tub baths or showers, repack the wound with gauze as directed following. Sponge baths are a good alternative. Sitz baths may be used three to four times a day or as directed.  If an antibiotic was ordered to fight the infection, take as directed.  Only take over-the-counter or prescription medicines for pain, discomfort, or fever as directed by your caregiver.  If a drain was in place and removed, use sitz baths for 20 minutes 4 times per day. Clean the wound gently with mild unscented soap, pat dry, and then apply a dry dressing as  directed. If you had surgery and IT WAS MARSUPIALIZED (LEFT OPEN):  Your wound was packed with gauze to keep the wound open. This allows the wound to heal from the inside outwards and continue draining. The changing of the dressing regularly also helps keep the wound clean.  Return as directed for a wound check.  If you take tub baths or showers, repack the wound with gauze as directed following. Sponge baths are a good alternative. Sitz baths can also be used. This may be done three to four times a day or as directed.  If an antibiotic was ordered to fight the infection, take as directed.  Only take over-the-counter or prescription medicines for pain, discomfort, or fever as directed by your caregiver.  If you had surgery and the wound was closed you may care for it as directed. This generally includes keeping it dry and clean and dressing it as directed. SEEK MEDICAL CARE IF:   You have increased pain, swelling, redness, drainage, or bleeding from the area. Pilonidal Cyst A pilonidal cyst occurs when hairs get trapped (ingrown) beneath the skin in the crease between the buttocks over your sacrum (the bone under that crease). Pilonidal cysts are most common in young men with a lot of body hair. When the cyst is ruptured (breaks) or leaking, fluid from the cyst may cause burning and itching. If the cyst becomes infected, it causes a painful swelling filled with pus (abscess). The pus and trapped hairs need to be removed (  often by lancing) so that the infection can heal. However, recurrence is common and an operation may be needed to remove the cyst. HOME CARE INSTRUCTIONS  If the cyst was NOT INFECTED: Keep the area clean and dry. Bathe or shower daily. Wash the area well with a germ-killing soap. Warm tub baths may help prevent infection and help with drainage. Dry the area well with a towel. Avoid tight clothing to keep area as moisture free as possible. Keep area between buttocks as free of  hair as possible. A depilatory may be used. If the cyst WAS INFECTED and needed to be drained: Your caregiver packed the wound with gauze to keep the wound open. This allows the wound to heal from the inside outwards and continue draining. Return for a wound check in 1 day or as suggested. If you take tub baths or showers, repack the wound with gauze following them. Sponge baths (at the sink) are a good alternative. If an antibiotic was ordered to fight the infection, take as directed. Only take over-the-counter or prescription medicines for pain, discomfort, or fever as directed by your caregiver. After the drain is removed, use sitz baths for 20 minutes 4 times per day. Clean the wound gently with mild unscented soap, pat dry, and then apply a dry dressing. SEEK MEDICAL CARE IF:  You have increased pain, swelling, redness, drainage, or bleeding from the area. You have a fever. You have muscles aches, dizziness, or a general ill feeling. Document Released: 04/04/2000 Document Revised: 06/30/2011 Document Reviewed: 06/02/2008 [facility] Patient Information 2014 [place], [place].   You have a fever.  You have muscles aches, dizziness, or a general ill feeling. Document Released: 05/08/2006 Document Revised: 12/08/2012 Document Reviewed: 07/23/2006 [facility] Patient Information 2014 [place], [place].

## 2013-08-03 LAB — URINE CULTURE
CULTURE: NO GROWTH
Colony Count: NO GROWTH

## 2013-08-08 LAB — CULTURE, BLOOD (ROUTINE X 2)
CULTURE: NO GROWTH
Culture: NO GROWTH

## 2014-06-26 ENCOUNTER — Emergency Department (HOSPITAL_COMMUNITY)
Admission: EM | Admit: 2014-06-26 | Discharge: 2014-06-27 | Disposition: A | Discharge status: Home / Self Care | Payer: Medicaid Other | Attending: Emergency Medicine | Admitting: Emergency Medicine

## 2014-06-26 ENCOUNTER — Encounter (HOSPITAL_COMMUNITY): Payer: Self-pay | Admitting: *Deleted

## 2014-06-26 ENCOUNTER — Emergency Department (HOSPITAL_COMMUNITY): Payer: Medicaid Other

## 2014-06-26 DIAGNOSIS — Y9339 Activity, other involving climbing, rappelling and jumping off: Secondary | ICD-10-CM | POA: Insufficient documentation

## 2014-06-26 DIAGNOSIS — Z792 Long term (current) use of antibiotics: Secondary | ICD-10-CM | POA: Insufficient documentation

## 2014-06-26 DIAGNOSIS — Z791 Long term (current) use of non-steroidal anti-inflammatories (NSAID): Secondary | ICD-10-CM | POA: Insufficient documentation

## 2014-06-26 DIAGNOSIS — Z720 Tobacco use: Secondary | ICD-10-CM | POA: Insufficient documentation

## 2014-06-26 DIAGNOSIS — G8929 Other chronic pain: Secondary | ICD-10-CM | POA: Insufficient documentation

## 2014-06-26 DIAGNOSIS — Y998 Other external cause status: Secondary | ICD-10-CM | POA: Insufficient documentation

## 2014-06-26 DIAGNOSIS — Y9289 Other specified places as the place of occurrence of the external cause: Secondary | ICD-10-CM | POA: Insufficient documentation

## 2014-06-26 DIAGNOSIS — W1830XA Fall on same level, unspecified, initial encounter: Secondary | ICD-10-CM | POA: Insufficient documentation

## 2014-06-26 DIAGNOSIS — S82892A Other fracture of left lower leg, initial encounter for closed fracture: Secondary | ICD-10-CM

## 2014-06-26 DIAGNOSIS — S8255XA Nondisplaced fracture of medial malleolus of left tibia, initial encounter for closed fracture: Secondary | ICD-10-CM | POA: Insufficient documentation

## 2014-06-26 DIAGNOSIS — D72829 Elevated white blood cell count, unspecified: Secondary | ICD-10-CM

## 2014-06-26 DIAGNOSIS — J45909 Unspecified asthma, uncomplicated: Secondary | ICD-10-CM | POA: Insufficient documentation

## 2014-06-26 LAB — CBC WITH DIFFERENTIAL/PLATELET
Basophils Absolute: 0 10*3/uL (ref 0.0–0.1)
Basophils Relative: 0 % (ref 0–1)
EOS ABS: 0.3 10*3/uL (ref 0.0–0.7)
Eosinophils Relative: 1 % (ref 0–5)
HEMATOCRIT: 45.8 % (ref 39.0–52.0)
Hemoglobin: 15.9 g/dL (ref 13.0–17.0)
LYMPHS ABS: 2.8 10*3/uL (ref 0.7–4.0)
LYMPHS PCT: 11 % — AB (ref 12–46)
MCH: 22.1 pg — ABNORMAL LOW (ref 26.0–34.0)
MCHC: 34.7 g/dL (ref 30.0–36.0)
MCV: 63.6 fL — ABNORMAL LOW (ref 78.0–100.0)
MONO ABS: 1.5 10*3/uL — AB (ref 0.1–1.0)
Monocytes Relative: 6 % (ref 3–12)
NEUTROS ABS: 21.2 10*3/uL — AB (ref 1.7–7.7)
Neutrophils Relative %: 82 % — ABNORMAL HIGH (ref 43–77)
Platelets: 219 10*3/uL (ref 150–400)
RBC: 7.2 MIL/uL — ABNORMAL HIGH (ref 4.22–5.81)
RDW: 17.3 % — ABNORMAL HIGH (ref 11.5–15.5)
WBC: 25.8 10*3/uL — ABNORMAL HIGH (ref 4.0–10.5)

## 2014-06-26 LAB — COMPREHENSIVE METABOLIC PANEL
ALT: 25 U/L (ref 0–53)
AST: 22 U/L (ref 0–37)
Albumin: 4.2 g/dL (ref 3.5–5.2)
Alkaline Phosphatase: 88 U/L (ref 39–117)
Anion gap: 9 (ref 5–15)
BUN: 13 mg/dL (ref 6–23)
CALCIUM: 9.4 mg/dL (ref 8.4–10.5)
CO2: 24 mmol/L (ref 19–32)
Chloride: 107 mmol/L (ref 96–112)
Creatinine, Ser: 1.12 mg/dL (ref 0.50–1.35)
GFR, EST NON AFRICAN AMERICAN: 82 mL/min — AB (ref >90)
GLUCOSE: 132 mg/dL — AB (ref 70–99)
Potassium: 4.1 mmol/L (ref 3.5–5.1)
SODIUM: 140 mmol/L (ref 135–145)
Total Bilirubin: 0.6 mg/dL (ref 0.3–1.2)
Total Protein: 8.3 g/dL (ref 6.0–8.3)

## 2014-06-26 MED ORDER — OXYCODONE-ACETAMINOPHEN 5-325 MG PO TABS: 1.0000 | ORAL_TABLET | Freq: Four times a day (QID) | ORAL | Status: DC | PRN

## 2014-06-26 NOTE — ED Notes (Signed)
Pt reports he fell off of a truck, landed on his L foot wrong.  Pt reports L ankle pain.

## 2014-06-26 NOTE — ED Provider Notes (Signed)
CSN: [id number]     Arrival date & time 06/26/14  1925 History   First MD Initiated Contact with Patient 06/26/14 2246     Chief Complaint  Patient presents with  . Fall  . Ankle Injury   HPI Patient presents to the emergency room for evaluation of a left ankle injury. Patient was jumping off a truck and landed on his left foot abnormally. He now has pain on his left ankle. Patient has pain when bearing weight. He denies any numbness or weakness. He denies any knee pain. He denies any other injuries.  While patient was in the waiting room he did have an episode of nausea and vomiting associated with the pain Past Medical History  Diagnosis Date  . Asthma   . Chronic back pain    History reviewed. No pertinent past surgical history. No family history on file. History  Substance Use Topics  . Smoking status: Current Every Day Smoker -- 2.00 packs/day for 7 years    Types: Cigarettes  . Smokeless tobacco: Not on file  . Alcohol Use: Yes    Review of Systems  All other systems reviewed and are negative.     Allergies  Review of patient's allergies indicates no known allergies.  Home Medications   Prior to Admission medications   Medication Sig Start Date End Date Taking? Authorizing Provider  acetaminophen (TYLENOL) 325 MG tablet Take 325 mg by mouth every 6 (six) hours as needed (headache).   Yes Historical Provider, MD  clindamycin (CLEOCIN) 150 MG capsule Take 2 capsules (300 mg total) by mouth 4 (four) times daily. Patient not taking: Reported on 06/26/2014 08/02/13   Shon Batonourtney F Horton, MD  ibuprofen (ADVIL,MOTRIN) 600 MG tablet Take 1 tablet (600 mg total) by mouth every 6 (six) hours as needed. Patient not taking: Reported on 06/26/2014 07/22/13   Earley FavorGail Schulz, NP  oxyCODONE-acetaminophen (PERCOCET/ROXICET) 5-325 MG per tablet Take 1 tablet by mouth every 6 (six) hours as needed for severe pain. 06/26/14   Linwood DibblesJon Tasha Diaz, MD   BP 113/63 mmHg  Pulse 92  Temp(Src) 99.2 F (37.3 C)  (Oral)  Resp 17  SpO2 97% Physical Exam  Constitutional: He appears well-developed and well-nourished. No distress.  HENT:  Head: Normocephalic and atraumatic.  Right Ear: External ear normal.  Left Ear: External ear normal.  Eyes: Conjunctivae are normal. Right eye exhibits no discharge. Left eye exhibits no discharge. No scleral icterus.  Neck: Neck supple. No tracheal deviation present.  Cardiovascular: Normal rate.   Pulmonary/Chest: Effort normal. No stridor. No respiratory distress.  Musculoskeletal: He exhibits no edema.       Left ankle: He exhibits swelling. Tenderness. Lateral malleolus and medial malleolus tenderness found. No head of 5th metatarsal and no proximal fibula tenderness found.  Neurological: He is alert. Cranial nerve deficit: no gross deficits.  Skin: Skin is warm and dry. No rash noted.  Psychiatric: He has a normal mood and affect.  Nursing note and vitals reviewed.   ED Course  Procedures (including critical care time) Labs Review Labs Reviewed  CBC WITH DIFFERENTIAL/PLATELET - Abnormal; Notable for the following:    WBC 25.8 (*)    RBC 7.20 (*)    MCV 63.6 (*)    MCH 22.1 (*)    RDW 17.3 (*)    Neutrophils Relative % 82 (*)    Lymphocytes Relative 11 (*)    Neutro Abs 21.2 (*)    Monocytes Absolute 1.5 (*)    All  other components within normal limits  COMPREHENSIVE METABOLIC PANEL - Abnormal; Notable for the following:    Glucose, Bld 132 (*)    GFR calc non Af Amer 82 (*)    All other components within normal limits  URINALYSIS, ROUTINE W REFLEX MICROSCOPIC    Imaging Review Dg Ankle Complete Left  06/26/2014   CLINICAL DATA:  Status post fall today with ankle swelling and pain both medially and laterally.  EXAM: LEFT ANKLE COMPLETE - 3+ VIEW  COMPARISON:  None.  FINDINGS: There is lucency in the distal tibia suggesting nondisplaced fracture of distal tibia/distal medial malleolus. No other acute fracture dislocation is identified. There is  soft tissue swelling of the medial and lateral ankle, more prominent laterally.  IMPRESSION: Lucency in the distal tibia/ distal medial malleolus suggesting nondisplaced fracture. Soft tissue swelling around the ankle, laterally greater than medially.   Electronically Signed   By: Sherian Rein M.D.   On: 06/26/2014 20:12     EKG Interpretation None      MDM   Final diagnoses:  Ankle fracture, left, closed, initial encounter  Leukocytosis    Patient has a nondisplaced ankle fracture. I will place him in a splint and provided crutches. Rx for pain medications  The patient had laboratory tests drawn in triage because an episode of nausea and vomiting. He has a leukocytosis of 25.8. Previous testing about a year ago he had a white blood cell count of 35.  Previous episode was within infection. It's possible this leukocytosis is related to do margination however the patient does need to have repeat testing. I discussed the importance  of having this retested in about a week or 2 when he is not having any acute medical issues.   Linwood Dibbles, MD 06/26/14 [phone]

## 2014-06-26 NOTE — Discharge Instructions (Signed)
Ankle Fracture  A fracture is a break in a bone. The ankle joint is made up of three bones. These include the lower (distal)sections of your lower leg bones, called the tibia and fibula, along with a bone in your foot, called the talus. Depending on how bad the break is and if more than one ankle joint bone is broken, a cast or splint is used to protect and keep your injured bone from moving while it heals. Sometimes, surgery is required to help the fracture heal properly.   There are two general types of fractures:   Stable fracture. This includes a single fracture line through one bone, with no injury to ankle ligaments. A fracture of the talus that does not have any displacement (movement of the bone on either side of the fracture line) is also stable.   Unstable fracture. This includes more than one fracture line through one or more bones in the ankle joint. It also includes fractures that have displacement of the bone on either side of the fracture line.  CAUSES   A direct blow to the ankle.    Quickly and severely twisting your ankle.   Trauma, such as a car accident or falling from a significant height.  RISK FACTORS  You may be at a higher risk of ankle fracture if:   You have certain medical conditions.   You are involved in high-impact sports.   You are involved in a high-impact car accident.  SIGNS AND SYMPTOMS    Tender and swollen ankle.   Bruising around the injured ankle.   Pain on movement of the ankle.   Difficulty walking or putting weight on the ankle.   A cold foot below the site of the ankle injury. This can occur if the blood vessels passing through your injured ankle were also damaged.   Numbness in the foot below the site of the ankle injury.  DIAGNOSIS   An ankle fracture is usually diagnosed with a physical exam and X-rays. A CT scan may also be required for complex fractures.  TREATMENT   Stable fractures are treated with a cast or splint and using crutches to avoid putting  weight on your injured ankle. This is followed by an ankle strengthening program. Some patients require a special type of cast, depending on other medical problems they may have. Unstable fractures require surgery to ensure the bones heal properly. Your health care provider will tell you what type of fracture you have and the best treatment for your condition.  HOME CARE INSTRUCTIONS    Review correct crutch use with your health care provider and use your crutches as directed. Safe use of crutches is extremely important. Misuse of crutches can cause you to fall or cause injury to nerves in your hands or armpits.   Do not put weight or pressure on the injured ankle until directed by your health care provider.   To lessen the swelling, keep the injured leg elevated while sitting or lying down.   Apply ice to the injured area:   Put ice in a plastic bag.   Place a towel between your cast and the bag.   Leave the ice on for 20 minutes, 2-3 times a day.   If you have a plaster or fiberglass cast:   Do not try to scratch the skin under the cast with any objects. This can increase your risk of skin infection.   Check the skin around the cast every day. You   may put lotion on any red or sore areas.   Keep your cast dry and clean.   If you have a plaster splint:   Wear the splint as directed.   You may loosen the elastic around the splint if your toes become numb, tingle, or turn cold or blue.   Do not put pressure on any part of your cast or splint; it may break. Rest your cast only on a pillow the first 24 hours until it is fully hardened.   Your cast or splint can be protected during bathing with a plastic bag sealed to your skin with medical tape. Do not lower the cast or splint into water.   Take medicines as directed by your health care provider. Only take over-the-counter or prescription medicines for pain, discomfort, or fever as directed by your health care provider.   Do not drive a vehicle until  your health care provider specifically tells you it is safe to do so.   If your health care provider has given you a follow-up appointment, it is very important to keep that appointment. Not keeping the appointment could result in a chronic or permanent injury, pain, and disability. If you have any problem keeping the appointment, call the facility for assistance.  SEEK MEDICAL CARE IF:  You develop increased swelling or discomfort.  SEEK IMMEDIATE MEDICAL CARE IF:    Your cast gets damaged or breaks.   You have continued severe pain.   You develop new pain or swelling after the cast was put on.   Your skin or toenails below the injury turn blue or gray.   Your skin or toenails below the injury feel cold, numb, or have loss of sensitivity to touch.   There is a bad smell or pus draining from under the cast.  MAKE SURE YOU:    Understand these instructions.   Will watch your condition.   Will get help right away if you are not doing well or get worse.  Document Released: 04/04/2000 Document Revised: 04/12/2013 Document Reviewed: 11/04/2012  [employer] Patient Information 2015 [employer]. This information is not intended to replace advice given to you by your health care provider. Make sure you discuss any questions you have with your health care provider.

## 2014-07-28 ENCOUNTER — Emergency Department (INDEPENDENT_AMBULATORY_CARE_PROVIDER_SITE_OTHER)
Admission: EM | Admit: 2014-07-28 | Discharge: 2014-07-28 | Disposition: A | Discharge status: Home / Self Care | Payer: Self-pay | Source: Home / Self Care | Attending: Family Medicine | Admitting: Family Medicine

## 2014-07-28 ENCOUNTER — Encounter (HOSPITAL_COMMUNITY): Payer: Self-pay | Admitting: Emergency Medicine

## 2014-07-28 DIAGNOSIS — IMO0001 Reserved for inherently not codable concepts without codable children: Secondary | ICD-10-CM

## 2014-07-28 DIAGNOSIS — R030 Elevated blood-pressure reading, without diagnosis of hypertension: Secondary | ICD-10-CM

## 2014-07-28 NOTE — Discharge Instructions (Signed)
DASH Eating Plan DASH stands for "Dietary Approaches to Stop Hypertension." The DASH eating plan is a healthy eating plan that has been shown to reduce high blood pressure (hypertension). Additional health benefits may include reducing the risk of type 2 diabetes mellitus, heart disease, and stroke. The DASH eating plan may also help with weight loss. WHAT DO I NEED TO KNOW ABOUT THE DASH EATING PLAN? For the DASH eating plan, you will follow these general guidelines:  Choose foods with a percent daily value for sodium of less than 5% (as listed on the food label).  Use salt-free seasonings or herbs instead of table salt or sea salt.  Check with your health care provider or pharmacist before using salt substitutes.  Eat lower-sodium products, often labeled as "lower sodium" or "no salt added."  Eat fresh foods.  Eat more vegetables, fruits, and low-fat dairy products.  Choose whole grains. Look for the word "whole" as the first word in the ingredient list.  Choose fish and skinless chicken or turkey more often than red meat. Limit fish, poultry, and meat to 6 oz (170 g) each day.  Limit sweets, desserts, sugars, and sugary drinks.  Choose heart-healthy fats.  Limit cheese to 1 oz (28 g) per day.  Eat more home-cooked food and less restaurant, buffet, and fast food.  Limit fried foods.  Cook foods using methods other than frying.  Limit canned vegetables. If you do use them, rinse them well to decrease the sodium.  When eating at a restaurant, ask that your food be prepared with less salt, or no salt if possible. WHAT FOODS CAN I EAT? Seek help from a dietitian for individual calorie needs. Grains Whole grain or whole wheat bread. Brown rice. Whole grain or whole wheat pasta. Quinoa, bulgur, and whole grain cereals. Low-sodium cereals. Corn or whole wheat flour tortillas. Whole grain cornbread. Whole grain crackers. Low-sodium crackers. Vegetables Fresh or frozen vegetables  (raw, steamed, roasted, or grilled). Low-sodium or reduced-sodium tomato and vegetable juices. Low-sodium or reduced-sodium tomato sauce and paste. Low-sodium or reduced-sodium canned vegetables.  Fruits All fresh, canned (in natural juice), or frozen fruits. Meat and Other Protein Products Ground beef (85% or leaner), grass-fed beef, or beef trimmed of fat. Skinless chicken or turkey. Ground chicken or turkey. Pork trimmed of fat. All fish and seafood. Eggs. Dried beans, peas, or lentils. Unsalted nuts and seeds. Unsalted canned beans. Dairy Low-fat dairy products, such as skim or 1% milk, 2% or reduced-fat cheeses, low-fat ricotta or cottage cheese, or plain low-fat yogurt. Low-sodium or reduced-sodium cheeses. Fats and Oils Tub margarines without trans fats. Light or reduced-fat mayonnaise and salad dressings (reduced sodium). Avocado. Safflower, olive, or canola oils. Natural peanut or almond butter. Other Unsalted popcorn and pretzels. The items listed above may not be a complete list of recommended foods or beverages. Contact your dietitian for more options. WHAT FOODS ARE NOT RECOMMENDED? Grains White bread. White pasta. White rice. Refined cornbread. Bagels and croissants. Crackers that contain trans fat. Vegetables Creamed or fried vegetables. Vegetables in a cheese sauce. Regular canned vegetables. Regular canned tomato sauce and paste. Regular tomato and vegetable juices. Fruits Dried fruits. Canned fruit in light or heavy syrup. Fruit juice. Meat and Other Protein Products Fatty cuts of meat. Ribs, chicken wings, bacon, sausage, bologna, salami, chitterlings, fatback, hot dogs, bratwurst, and packaged luncheon meats. Salted nuts and seeds. Canned beans with salt. Dairy Whole or 2% milk, cream, half-and-half, and cream cheese. Whole-fat or sweetened yogurt. Full-fat   cheeses or blue cheese. Nondairy creamers and whipped toppings. Processed cheese, cheese spreads, or cheese  curds. Condiments Onion and garlic salt, seasoned salt, table salt, and sea salt. Canned and packaged gravies. Worcestershire sauce. Tartar sauce. Barbecue sauce. Teriyaki sauce. Soy sauce, including reduced sodium. Steak sauce. Fish sauce. Oyster sauce. Cocktail sauce. Horseradish. Ketchup and mustard. Meat flavorings and tenderizers. Bouillon cubes. Hot sauce. Tabasco sauce. Marinades. Taco seasonings. Relishes. Fats and Oils Butter, stick margarine, lard, shortening, ghee, and bacon fat. Coconut, palm kernel, or palm oils. Regular salad dressings. Other Pickles and olives. Salted popcorn and pretzels. The items listed above may not be a complete list of foods and beverages to avoid. Contact your dietitian for more information. WHERE CAN I FIND MORE INFORMATION? National [employer], [employer], [employer]: www.nhlbi.nih.gov/health/health-topics/topics/dash/ Document Released: 03/27/2011 Document Revised: 08/22/2013 Document Reviewed: 02/09/2013 Exit[employer] Patient Information 2015 [employer]. This information is not intended to replace advice given to you by your health care provider. Make sure you discuss any questions you have with your health care provider. Hypertension Hypertension, commonly called high blood pressure, is when the force of blood pumping through your arteries is too strong. Your arteries are the blood vessels that carry blood from your heart throughout your body. A blood pressure reading consists of a higher number over a lower number, such as 110/72. The higher number (systolic) is the pressure inside your arteries when your heart pumps. The lower number (diastolic) is the pressure inside your arteries when your heart relaxes. Ideally you want your blood pressure below 120/80. Hypertension forces your heart to work harder to pump blood. Your arteries may become narrow or stiff. Having hypertension puts you at risk for heart disease, stroke, and other problems.  RISK  FACTORS Some risk factors for high blood pressure are controllable. Others are not.  Risk factors you cannot control include:   Race. You may be at higher risk if you are African American.  Age. Risk increases with age.  Gender. Men are at higher risk than women before age 45 years. After age 65, women are at higher risk than men. Risk factors you can control include:  Not getting enough exercise or physical activity.  Being overweight.  Getting too much fat, sugar, calories, or salt in your diet.  Drinking too much alcohol. SIGNS AND SYMPTOMS Hypertension does not usually cause signs or symptoms. Extremely high blood pressure (hypertensive crisis) may cause headache, anxiety, shortness of breath, and nosebleed. DIAGNOSIS  To check if you have hypertension, your health care provider will measure your blood pressure while you are seated, with your arm held at the level of your heart. It should be measured at least twice using the same arm. Certain conditions can cause a difference in blood pressure between your right and left arms. A blood pressure reading that is higher than normal on one occasion does not mean that you need treatment. If one blood pressure reading is high, ask your health care provider about having it checked again. TREATMENT  Treating high blood pressure includes making lifestyle changes and possibly taking medicine. Living a healthy lifestyle can help lower high blood pressure. You may need to change some of your habits. Lifestyle changes may include:  Following the DASH diet. This diet is high in fruits, vegetables, and whole grains. It is low in salt, red meat, and added sugars.  Getting at least 2 hours of brisk physical activity every week.  Losing weight if necessary.  Not smoking.  Limiting   alcoholic beverages.  Learning ways to reduce stress. If lifestyle changes are not enough to get your blood pressure under control, your health care provider may  prescribe medicine. You may need to take more than one. Work closely with your health care provider to understand the risks and benefits. HOME CARE INSTRUCTIONS  Have your blood pressure rechecked as directed by your health care provider.   Take medicines only as directed by your health care provider. Follow the directions carefully. Blood pressure medicines must be taken as prescribed. The medicine does not work as well when you skip doses. Skipping doses also puts you at risk for problems.   Do not smoke.   Monitor your blood pressure at home as directed by your health care provider. SEEK MEDICAL CARE IF:   You think you are having a reaction to medicines taken.  You have recurrent headaches or feel dizzy.  You have swelling in your ankles.  You have trouble with your vision. SEEK IMMEDIATE MEDICAL CARE IF:  You develop a severe headache or confusion.  You have unusual weakness, numbness, or feel faint.  You have severe chest or abdominal pain.  You vomit repeatedly.  You have trouble breathing. MAKE SURE YOU:   Understand these instructions.  Will watch your condition.  Will get help right away if you are not doing well or get worse. Document Released: 04/07/2005 Document Revised: 08/22/2013 Document Reviewed: 01/28/2013 Exit[employer] Patient Information 2015 [employer]. This information is not intended to replace advice given to you by your health care provider. Make sure you discuss any questions you have with your health care provider.  

## 2014-07-28 NOTE — ED Notes (Signed)
C/o HTN at home onset Wednesday Denies HA, SOB, weakness, CP Alert, no signs of acute distress.

## 2014-07-28 NOTE — ED Provider Notes (Signed)
CSN: [id number]     Arrival date & time 07/28/14  1222 History   First MD Initiated Contact with Patient 07/28/14 1319     No chief complaint on file.  (Consider location/radiation/quality/duration/timing/severity/associated sxs/prior Treatment) HPI        39 year old male presents for evaluation of elevated blood pressure. He has tried to donate plasma for the past 2 days and has been blood pressure is elevated. He is unsure of exactly what it was at the plasma center. Denies chest pain or shortness of breath. Urinating normally. No history of hypertension  Past Medical History  Diagnosis Date  . Asthma   . Chronic back pain    No past surgical history on file. No family history on file. History  Substance Use Topics  . Smoking status: Current Every Day Smoker -- 2.00 packs/day for 7 years    Types: Cigarettes  . Smokeless tobacco: Not on file  . Alcohol Use: Yes    Review of Systems  Constitutional: Negative for fever, chills and fatigue.  HENT: Negative for sore throat.   Eyes: Negative for visual disturbance.  Respiratory: Negative for cough and shortness of breath.   Cardiovascular: Negative for chest pain, palpitations and leg swelling.  Gastrointestinal: Negative for nausea, vomiting, abdominal pain, diarrhea and constipation.  Genitourinary: Negative for dysuria, urgency, frequency and hematuria.  Musculoskeletal: Negative for myalgias, arthralgias, neck pain and neck stiffness.  Skin: Negative for rash.  Neurological: Negative for dizziness, weakness and light-headedness.  All other systems reviewed and are negative.   Allergies  Review of patient's allergies indicates no known allergies.  Home Medications   Prior to Admission medications   Medication Sig Start Date End Date Taking? Authorizing Provider  acetaminophen (TYLENOL) 325 MG tablet Take 325 mg by mouth every 6 (six) hours as needed (headache).    Historical Provider, MD  clindamycin (CLEOCIN) 150 MG  capsule Take 2 capsules (300 mg total) by mouth 4 (four) times daily. Patient not taking: Reported on 06/26/2014 08/02/13   Shon Batonourtney F Horton, MD  ibuprofen (ADVIL,MOTRIN) 600 MG tablet Take 1 tablet (600 mg total) by mouth every 6 (six) hours as needed. Patient not taking: Reported on 06/26/2014 07/22/13   Earley FavorGail Schulz, NP  oxyCODONE-acetaminophen (PERCOCET/ROXICET) 5-325 MG per tablet Take 1 tablet by mouth every 6 (six) hours as needed for severe pain. 06/26/14   Linwood DibblesJon Knapp, MD   BP 144/84 mmHg  Pulse 94  Temp(Src) 97.6 F (36.4 C) (Oral)  SpO2 95% Physical Exam  Constitutional: He is oriented to person, place, and time. He appears well-developed and well-nourished. No distress.  HENT:  Head: Normocephalic.  Pulmonary/Chest: Effort normal. No respiratory distress.  Neurological: He is alert and oriented to person, place, and time. Coordination normal.  Skin: Skin is warm and dry. No rash noted. He is not diaphoretic.  Psychiatric: He has a normal mood and affect. Judgment normal.  Nursing note and vitals reviewed.   ED Course  Procedures (including critical care time) Labs Review Labs Reviewed - No data to display  Imaging Review No results found.   MDM   1. Elevated blood pressure    The blood pressure is normal here today, rechecked and was mildly elevated. No red flags or emergent symptoms. Follow-up with primary care       Graylon GoodZachary H Sonam Huelsmann, PA-C 07/28/14 1342

## 2014-11-19 ENCOUNTER — Emergency Department (HOSPITAL_COMMUNITY)
Admission: EM | Admit: 2014-11-19 | Discharge: 2014-11-19 | Disposition: A | Discharge status: Home / Self Care | Payer: Self-pay | Attending: Emergency Medicine | Admitting: Emergency Medicine

## 2014-11-19 ENCOUNTER — Encounter (HOSPITAL_COMMUNITY): Payer: Self-pay | Admitting: *Deleted

## 2014-11-19 ENCOUNTER — Emergency Department (HOSPITAL_COMMUNITY): Payer: Self-pay

## 2014-11-19 DIAGNOSIS — J45901 Unspecified asthma with (acute) exacerbation: Secondary | ICD-10-CM | POA: Insufficient documentation

## 2014-11-19 DIAGNOSIS — R000 Tachycardia, unspecified: Secondary | ICD-10-CM | POA: Insufficient documentation

## 2014-11-19 DIAGNOSIS — E860 Dehydration: Secondary | ICD-10-CM | POA: Insufficient documentation

## 2014-11-19 DIAGNOSIS — R42 Dizziness and giddiness: Secondary | ICD-10-CM | POA: Insufficient documentation

## 2014-11-19 DIAGNOSIS — Z720 Tobacco use: Secondary | ICD-10-CM | POA: Insufficient documentation

## 2014-11-19 LAB — CBC
HCT: 51.7 % (ref 39.0–52.0)
Hemoglobin: 18.4 g/dL — ABNORMAL HIGH (ref 13.0–17.0)
MCH: 22.3 pg — ABNORMAL LOW (ref 26.0–34.0)
MCHC: 35.6 g/dL (ref 30.0–36.0)
MCV: 62.7 fL — AB (ref 78.0–100.0)
Platelets: 228 10*3/uL (ref 150–400)
RBC: 8.24 MIL/uL — AB (ref 4.22–5.81)
RDW: 17.5 % — AB (ref 11.5–15.5)
WBC: 17.5 10*3/uL — ABNORMAL HIGH (ref 4.0–10.5)

## 2014-11-19 LAB — BASIC METABOLIC PANEL
Anion gap: 8 (ref 5–15)
BUN: 11 mg/dL (ref 6–20)
CALCIUM: 9.2 mg/dL (ref 8.9–10.3)
CO2: 23 mmol/L (ref 22–32)
Chloride: 109 mmol/L (ref 101–111)
Creatinine, Ser: 1.8 mg/dL — ABNORMAL HIGH (ref 0.61–1.24)
GFR calc non Af Amer: 46 mL/min — ABNORMAL LOW (ref >60)
GFR, EST AFRICAN AMERICAN: 53 mL/min — AB (ref >60)
GLUCOSE: 158 mg/dL — AB (ref 65–99)
POTASSIUM: 3.8 mmol/L (ref 3.5–5.1)
SODIUM: 140 mmol/L (ref 135–145)

## 2014-11-19 LAB — I-STAT TROPONIN, ED: Troponin i, poc: 0.02 ng/mL (ref 0.00–0.08)

## 2014-11-19 MED ORDER — SODIUM CHLORIDE 0.9 % IV BOLUS (SEPSIS)
2000.0000 mL | Freq: Once | INTRAVENOUS | Status: AC
  Administered 2014-11-19: 2000 mL via INTRAVENOUS

## 2014-11-19 NOTE — ED Notes (Signed)
Pt complains of shortness of breath since 445PM today. Pt was outside at a park at the time, pt states he felt short of breath ~30 minutes after smoking a cigarette.

## 2014-11-19 NOTE — ED Provider Notes (Signed)
CSN: [id number]     Arrival date & time 11/19/14  1758 History   First MD Initiated Contact with Patient 11/19/14 1826     Chief Complaint  Patient presents with  . Shortness of Breath  . Dizziness     (Consider location/radiation/quality/duration/timing/severity/associated sxs/prior Treatment) Patient is a 39 y.o. male presenting with shortness of breath and dizziness. The history is provided by the patient and the spouse.  Shortness of Breath Severity:  Severe Onset quality:  Sudden Duration:  2 hours Timing:  Constant Progression:  Partially resolved Chronicity:  New Context: activity and smoke exposure   Context comment:  Plasma donation Relieved by:  Lying down Worsened by:  Nothing tried Ineffective treatments:  None tried Associated symptoms: cough and sputum production   Associated symptoms: no abdominal pain, no chest pain, no fever, no headaches, no rash and no vomiting   Dizziness Associated symptoms: shortness of breath   Associated symptoms: no chest pain, no diarrhea, no headaches, no palpitations and no vomiting    39 yo M with a chief complaint of shortness breath. This started a couple hours ago while he was at a park. Patient donated plasma today as well as has not had anything really to eat or drink all day. Patient was outside in the 96 weather and started feeling lightheaded. Patient smoked a cigarette and then felt like he may pass out associated with some shortness of breath. Patient denies recent long travel denies swollen extremity denies history of cancer denies trauma.  Past Medical History  Diagnosis Date  . Asthma   . Chronic back pain    History reviewed. No pertinent past surgical history. No family history on file. History  Substance Use Topics  . Smoking status: Current Every Day Smoker -- 2.00 packs/day for 7 years    Types: Cigarettes  . Smokeless tobacco: Not on file  . Alcohol Use: Yes    Review of Systems  Constitutional: Negative  for fever and chills.  HENT: Negative for congestion and facial swelling.   Eyes: Negative for discharge and visual disturbance.  Respiratory: Positive for cough, sputum production and shortness of breath.   Cardiovascular: Negative for chest pain and palpitations.  Gastrointestinal: Negative for vomiting, abdominal pain and diarrhea.  Musculoskeletal: Negative for myalgias and arthralgias.  Skin: Negative for color change and rash.  Neurological: Positive for dizziness. Negative for tremors, syncope and headaches.  Psychiatric/Behavioral: Negative for confusion and dysphoric mood.      Allergies  Review of patient's allergies indicates no known allergies.  Home Medications   Prior to Admission medications   Medication Sig Start Date End Date Taking? Authorizing Provider  oxyCODONE-acetaminophen (PERCOCET/ROXICET) 5-325 MG per tablet Take 1 tablet by mouth every 6 (six) hours as needed for severe pain. Patient not taking: Reported on 11/19/2014 06/26/14   Linwood Dibbles, MD   BP 92/58 mmHg  Pulse 84  Temp(Src) 98.2 F (36.8 C) (Oral)  Resp 28  SpO2 94% Physical Exam  Constitutional: He is oriented to person, place, and time. He appears well-developed and well-nourished.  HENT:  Head: Normocephalic and atraumatic.  Eyes: EOM are normal. Pupils are equal, round, and reactive to light.  Neck: Normal range of motion. Neck supple. No JVD present.  Cardiovascular: Regular rhythm.  Tachycardia present.  Exam reveals no gallop and no friction rub.   No murmur heard. Pulmonary/Chest: No respiratory distress. He has no wheezes.  Abdominal: He exhibits no distension. There is no rebound and no guarding.  Musculoskeletal: Normal range of motion.  Neurological: He is alert and oriented to person, place, and time.  Skin: No rash noted. No pallor.  Psychiatric: He has a normal mood and affect. His behavior is normal.    ED Course  Procedures (including critical care time) Labs Review Labs  Reviewed  BASIC METABOLIC PANEL - Abnormal; Notable for the following:    Glucose, Bld 158 (*)    Creatinine, Ser 1.80 (*)    GFR calc non Af Amer 46 (*)    GFR calc Af Amer 53 (*)    All other components within normal limits  CBC - Abnormal; Notable for the following:    WBC 17.5 (*)    RBC 8.24 (*)    Hemoglobin 18.4 (*)    MCV 62.7 (*)    MCH 22.3 (*)    RDW 17.5 (*)    All other components within normal limits  I-STAT TROPOININ, ED    Imaging Review Dg Chest 2 View  11/19/2014   CLINICAL DATA:  Shortness of breath, dizziness  EXAM: CHEST  2 VIEW  COMPARISON:  08/02/2013  FINDINGS: Lungs are clear.  No pleural effusion or pneumothorax.  The heart is normal in size.  Visualized osseous structures are within normal limits.  IMPRESSION: No evidence of acute cardiopulmonary disease.   Electronically Signed   By: Charline Bills M.D.   On: 11/19/2014 18:41     EKG Interpretation None      MDM   Final diagnoses:  Dehydration    39 yo M with a chief complaint of shortness of breath. Patient also had a near syncopal event. Per family he donated plasma today and then was outside all day for his daughter's birthday party. Likely symptomatic due to hypovolemia patient is normal temperature. Blood work ordered as triage orders. Troponin negative. Suspect secondary to hypovolemia will give 2 L fluid and then reassess. Feel the patient is low risk for PE. Patient has no chest pain.  Labwork unremarkable patient's symptoms completely resolved with 2 L of fluids. Patient with normal vital signs on recheck. Document respiratory rate is 28 however in the region counting patient's respiratory rate is 16. 8:59 PM:  I have discussed the diagnosis/risks/treatment options with the patient and family and believe the pt to be eligible for discharge home to follow-up with PCP. We also discussed returning to the ED immediately if new or worsening sx occur. We discussed the sx which are most concerning  (e.g., syncope, worsening sob) that necessitate immediate return. Medications administered to the patient during their visit and any new prescriptions provided to the patient are listed below.  Medications given during this visit Medications  sodium chloride 0.9 % bolus 2,000 mL (2,000 mLs Intravenous New Bag/Given 11/19/14 1949)    New Prescriptions   No medications on file     The patient appears reasonably screen and/or stabilized for discharge and I doubt any other medical condition or other [facility] requiring further screening, evaluation, or treatment in the ED at this time prior to discharge.    Melene Plan, DO 11/19/14 2059

## 2014-11-19 NOTE — Discharge Instructions (Signed)
Dehydration, Adult Dehydration is when you lose more fluids from the body than you take in. Vital organs like the kidneys, brain, and heart cannot function without a proper amount of fluids and salt. Any loss of fluids from the body can cause dehydration.  CAUSES   Vomiting.  Diarrhea.  Excessive sweating.  Excessive urine output.  Fever. SYMPTOMS  Mild dehydration  Thirst.  Dry lips.  Slightly dry mouth. Moderate dehydration  Very dry mouth.  Sunken eyes.  Skin does not bounce back quickly when lightly pinched and released.  Dark urine and decreased urine production.  Decreased tear production.  Headache. Severe dehydration  Very dry mouth.  Extreme thirst.  Rapid, weak pulse (more than 100 beats per minute at rest).  Cold hands and feet.  Not able to sweat in spite of heat and temperature.  Rapid breathing.  Blue lips.  Confusion and lethargy.  Difficulty being awakened.  Minimal urine production.  No tears. DIAGNOSIS  Your caregiver will diagnose dehydration based on your symptoms and your exam. Blood and urine tests will help confirm the diagnosis. The diagnostic evaluation should also identify the cause of dehydration. TREATMENT  Treatment of mild or moderate dehydration can often be done at home by increasing the amount of fluids that you drink. It is best to drink small amounts of fluid more often. Drinking too much at one time can make vomiting worse. Refer to the home care instructions below. Severe dehydration needs to be treated at the hospital where you will probably be given intravenous (IV) fluids that contain water and electrolytes. HOME CARE INSTRUCTIONS   Ask your caregiver about specific rehydration instructions.  Drink enough fluids to keep your urine clear or pale yellow.  Drink small amounts frequently if you have nausea and vomiting.  Eat as you normally do.  Avoid:  Foods or drinks high in sugar.  Carbonated  drinks.  Juice.  Extremely hot or cold fluids.  Drinks with caffeine.  Fatty, greasy foods.  Alcohol.  Tobacco.  Overeating.  Gelatin desserts.  Wash your hands well to avoid spreading bacteria and viruses.  Only take over-the-counter or prescription medicines for pain, discomfort, or fever as directed by your caregiver.  Ask your caregiver if you should continue all prescribed and over-the-counter medicines.  Keep all follow-up appointments with your caregiver. SEEK MEDICAL CARE IF:  You have abdominal pain and it increases or stays in one area (localizes).  You have a rash, stiff neck, or severe headache.  You are irritable, sleepy, or difficult to awaken.  You are weak, dizzy, or extremely thirsty. SEEK IMMEDIATE MEDICAL CARE IF:   You are unable to keep fluids down or you get worse despite treatment.  You have frequent episodes of vomiting or diarrhea.  You have blood or green matter (bile) in your vomit.  You have blood in your stool or your stool looks black and tarry.  You have not urinated in 6 to 8 hours, or you have only urinated a small amount of very dark urine.  You have a fever.  You faint. MAKE SURE YOU:   Understand these instructions.  Will watch your condition.  Will get help right away if you are not doing well or get worse. Document Released: 04/07/2005 Document Revised: 06/30/2011 Document Reviewed: 11/25/2010 [employer] Patient Information 2015 [employer]. This information is not intended to replace advice given to you by your health care provider. Make sure you discuss any questions you have with your health care   provider.  

## 2015-04-19 ENCOUNTER — Encounter (HOSPITAL_COMMUNITY): Payer: Self-pay | Admitting: *Deleted

## 2015-04-19 ENCOUNTER — Emergency Department (HOSPITAL_COMMUNITY)
Admission: EM | Admit: 2015-04-19 | Discharge: 2015-04-20 | Disposition: A | Discharge status: Home / Self Care | Payer: Self-pay | Attending: Emergency Medicine | Admitting: Emergency Medicine

## 2015-04-19 ENCOUNTER — Emergency Department (HOSPITAL_COMMUNITY): Payer: Self-pay

## 2015-04-19 DIAGNOSIS — Z79899 Other long term (current) drug therapy: Secondary | ICD-10-CM | POA: Insufficient documentation

## 2015-04-19 DIAGNOSIS — G8929 Other chronic pain: Secondary | ICD-10-CM | POA: Insufficient documentation

## 2015-04-19 DIAGNOSIS — J45901 Unspecified asthma with (acute) exacerbation: Secondary | ICD-10-CM | POA: Insufficient documentation

## 2015-04-19 DIAGNOSIS — F17210 Nicotine dependence, cigarettes, uncomplicated: Secondary | ICD-10-CM | POA: Insufficient documentation

## 2015-04-19 DIAGNOSIS — R0789 Other chest pain: Secondary | ICD-10-CM | POA: Insufficient documentation

## 2015-04-19 DIAGNOSIS — M25511 Pain in right shoulder: Secondary | ICD-10-CM | POA: Insufficient documentation

## 2015-04-19 MED ORDER — DIAZEPAM 5 MG PO TABS
10.0000 mg | ORAL_TABLET | Freq: Once | ORAL | Status: AC
  Administered 2015-04-19: 10 mg via ORAL
  Filled 2015-04-19: qty 2

## 2015-04-19 NOTE — ED Notes (Signed)
Pt c/o rt sided shoulder pain to the scapula area x 2 days; pt states that he feels short of breath; pt states that the pain increases with movement and becomes more short of breath with exertion; pt denies injury to shoulder area

## 2015-04-19 NOTE — ED Provider Notes (Signed)
CSN: [id number]     Arrival date & time 04/19/15  2203 History   First MD Initiated Contact with Patient 04/19/15 2236     Chief Complaint  Patient presents with  . Shortness of Breath  . Shoulder Pain     (Consider location/radiation/quality/duration/timing/severity/associated sxs/prior Treatment) Patient is a 39 y.o. male presenting with shortness of breath and shoulder pain. The history is provided by the patient and a significant other. No language interpreter was used.  Shortness of Breath Severity:  Moderate Onset quality:  Sudden Duration:  2 days Timing:  Intermittent Progression:  Waxing and waning Chronicity:  New Context: activity   Associated symptoms: no abdominal pain   Associated symptoms comment:  Right shoulder pain Shoulder Pain   Past Medical History  Diagnosis Date  . Asthma   . Chronic back pain    History reviewed. No pertinent past surgical history. No family history on file. Social History  Substance Use Topics  . Smoking status: Current Every Day Smoker -- 2.00 packs/day for 7 years    Types: Cigarettes  . Smokeless tobacco: None  . Alcohol Use: Yes    Review of Systems  Respiratory: Positive for shortness of breath.   Gastrointestinal: Negative for abdominal pain.  Musculoskeletal: Positive for myalgias.  All other systems reviewed and are negative.     Allergies  Review of patient's allergies indicates no known allergies.  Home Medications   Prior to Admission medications   Medication Sig Start Date End Date Taking? Authorizing Provider  acetaminophen (TYLENOL) 500 MG tablet Take 1,000 mg by mouth every 6 (six) hours as needed for mild pain.   Yes Historical Provider, MD  Menthol, Topical Analgesic, 5 % PADS Apply 1 each topically daily.   Yes Historical Provider, MD  oxyCODONE-acetaminophen (PERCOCET/ROXICET) 5-325 MG per tablet Take 1 tablet by mouth every 6 (six) hours as needed for severe pain. Patient not taking: Reported on  11/19/2014 06/26/14   Linwood Dibbles, MD   BP 124/78 mmHg  Pulse 89  Temp(Src) 98.1 F (36.7 C) (Oral)  Resp 20  Ht  (1.753 m)  Wt 101.152 kg  BMI 32.92 kg/m2  SpO2 97% Physical Exam  Constitutional: He is oriented to person, place, and time. He appears well-developed and well-nourished.  HENT:  Head: Normocephalic.  Eyes: Conjunctivae are normal.  Neck: Neck supple.  Cardiovascular: Normal rate and regular rhythm.   Pulmonary/Chest: Effort normal.  Abdominal: Soft. Bowel sounds are normal.  Musculoskeletal: He exhibits tenderness. He exhibits no edema.       Arms: Lymphadenopathy:    He has no cervical adenopathy.  Neurological: He is alert and oriented to person, place, and time.  Skin: Skin is warm and dry.  Psychiatric: He has a normal mood and affect.  Nursing note and vitals reviewed.   ED Course  Procedures (including critical care time) Labs Review Labs Reviewed - No data to display  Imaging Review Dg Chest 2 View  04/19/2015  CLINICAL DATA:  Initial encounter for increasing chest pain over the last 48 hours with worsening shortness of breath for the last several hours. EXAM: CHEST  2 VIEW COMPARISON:  11/19/2014. FINDINGS: Two-view exam shows hyperexpansion. Cardiopericardial silhouette is at upper limits of normal for size. Basilar interstitial and alveolar opacity without dense focal airspace consolidation. No pleural effusion. Imaged bony structures of the thorax are intact. IMPRESSION: Interstitial and alveolar opacity at the lung bases bilaterally. Imaging features may be related to an atypical infectious or  inflammatory process Electronically Signed   By: Kennith CenterEric  Mansell M.D.   On: 04/19/2015 23:24   I have personally reviewed and evaluated these images and lab results as part of my medical decision-making.   EKG Interpretation None     Chest wall pain. Improved after diazepam in ED. Chest xray results reviewed and shared with patient.  Inflammatory changes  noted.  Patient without fever or cough.  Will recommend follow-up with pulmonology. MDM   Final diagnoses:  None    Chest wall pain. Care instructions provided. Return precautions discussed.    Felicie Mornavid Nickisha Hum, NP 04/20/15 [id number]  Derwood KaplanAnkit Nanavati, MD 04/20/15 [phone]

## 2015-04-20 MED ORDER — NAPROXEN 500 MG PO TABS: 500.0000 mg | ORAL_TABLET | Freq: Two times a day (BID) | ORAL | Status: DC

## 2015-04-20 MED ORDER — CYCLOBENZAPRINE HCL 10 MG PO TABS: 10.0000 mg | ORAL_TABLET | Freq: Two times a day (BID) | ORAL | Status: DC | PRN

## 2015-04-20 NOTE — Discharge Instructions (Signed)

## 2015-08-21 ENCOUNTER — Encounter (HOSPITAL_COMMUNITY): Payer: Self-pay | Admitting: Emergency Medicine

## 2015-08-21 ENCOUNTER — Emergency Department (HOSPITAL_COMMUNITY)
Admission: EM | Admit: 2015-08-21 | Discharge: 2015-08-21 | Disposition: A | Discharge status: Home / Self Care | Payer: No Typology Code available for payment source | Attending: Emergency Medicine | Admitting: Emergency Medicine

## 2015-08-21 ENCOUNTER — Emergency Department (HOSPITAL_COMMUNITY): Payer: No Typology Code available for payment source

## 2015-08-21 DIAGNOSIS — S80211A Abrasion, right knee, initial encounter: Secondary | ICD-10-CM | POA: Insufficient documentation

## 2015-08-21 DIAGNOSIS — Y998 Other external cause status: Secondary | ICD-10-CM | POA: Diagnosis not present

## 2015-08-21 DIAGNOSIS — F17210 Nicotine dependence, cigarettes, uncomplicated: Secondary | ICD-10-CM | POA: Diagnosis not present

## 2015-08-21 DIAGNOSIS — Z79899 Other long term (current) drug therapy: Secondary | ICD-10-CM | POA: Diagnosis not present

## 2015-08-21 DIAGNOSIS — Y9389 Activity, other specified: Secondary | ICD-10-CM | POA: Diagnosis not present

## 2015-08-21 DIAGNOSIS — J45909 Unspecified asthma, uncomplicated: Secondary | ICD-10-CM | POA: Diagnosis not present

## 2015-08-21 DIAGNOSIS — Y92410 Unspecified street and highway as the place of occurrence of the external cause: Secondary | ICD-10-CM | POA: Insufficient documentation

## 2015-08-21 DIAGNOSIS — G8929 Other chronic pain: Secondary | ICD-10-CM | POA: Insufficient documentation

## 2015-08-21 DIAGNOSIS — M791 Myalgia, unspecified site: Secondary | ICD-10-CM

## 2015-08-21 DIAGNOSIS — M79675 Pain in left toe(s): Secondary | ICD-10-CM

## 2015-08-21 DIAGNOSIS — S3992XA Unspecified injury of lower back, initial encounter: Secondary | ICD-10-CM | POA: Diagnosis not present

## 2015-08-21 DIAGNOSIS — Z791 Long term (current) use of non-steroidal anti-inflammatories (NSAID): Secondary | ICD-10-CM | POA: Diagnosis not present

## 2015-08-21 DIAGNOSIS — S99922A Unspecified injury of left foot, initial encounter: Secondary | ICD-10-CM | POA: Insufficient documentation

## 2015-08-21 MED ORDER — CYCLOBENZAPRINE HCL 10 MG PO TABS: 10.0000 mg | ORAL_TABLET | Freq: Two times a day (BID) | ORAL | Status: DC | PRN

## 2015-08-21 MED ORDER — TRAMADOL HCL 50 MG PO TABS: 50.0000 mg | ORAL_TABLET | Freq: Four times a day (QID) | ORAL | Status: DC | PRN

## 2015-08-21 NOTE — Discharge Instructions (Signed)
Muscle Pain, Adult Muscle pain (myalgia) may be caused by many things, including:  Overuse or muscle strain, especially if you are not in shape. This is the most common cause of muscle pain.  Injury.  Bruises.  Viruses, such as the flu.  Infectious diseases.  Fibromyalgia, which is a chronic condition that causes muscle tenderness, fatigue, and headache.  Autoimmune diseases, including lupus.  Certain drugs, including ACE inhibitors and statins. Muscle pain may be mild or severe. In most cases, the pain lasts only a short time and goes away without treatment. To diagnose the cause of your muscle pain, your health care provider will take your medical history. This means he or she will ask you when your muscle pain began and what has been happening. If you have not had muscle pain for very long, your health care provider may want to wait before doing much testing. If your muscle pain has lasted a long time, your health care provider may want to run tests right away. If your health care provider thinks your muscle pain may be caused by illness, you may need to have additional tests to rule out certain conditions.  Treatment for muscle pain depends on the cause. Home care is often enough to relieve muscle pain. Your health care provider may also prescribe anti-inflammatory medicine. HOME CARE INSTRUCTIONS Watch your condition for any changes. The following actions may help to lessen any discomfort you are feeling:  Only take over-the-counter or prescription medicines as directed by your health care provider.  Apply ice to the sore muscle:  Put ice in a plastic bag.  Place a towel between your skin and the bag.  Leave the ice on for 15-20 minutes, 3-4 times a day.  You may alternate applying hot and cold packs to the muscle as directed by your health care provider.  If overuse is causing your muscle pain, slow down your activities until the pain goes away.  Remember that it is normal  to feel some muscle pain after starting a workout program. Muscles that have not been used often will be sore at first.  Do regular, gentle exercises if you are not usually active.  Warm up before exercising to lower your risk of muscle pain.  Do not continue working out if the pain is very bad. Bad pain could mean you have injured a muscle. SEEK MEDICAL CARE IF:  Your muscle pain gets worse, and medicines do not help.  You have muscle pain that lasts longer than 3 days.  You have a rash or fever along with muscle pain.  You have muscle pain after a tick bite.  You have muscle pain while working out, even though you are in good physical condition.  You have redness, soreness, or swelling along with muscle pain.  You have muscle pain after starting a new medicine or changing the dose of a medicine. SEEK IMMEDIATE MEDICAL CARE IF:  You have trouble breathing.  You have trouble swallowing.  You have muscle pain along with a stiff neck, fever, and vomiting.  You have severe muscle weakness or cannot move part of your body. MAKE SURE YOU:   Understand these instructions.  Will watch your condition.  Will get help right away if you are not doing well or get worse.   This information is not intended to replace advice given to you by your health care provider. Make sure you discuss any questions you have with your health care provider.  Follow up with  [facility] if your symptoms do not improve. Take flexeril and tramadol as needed for pain. Wear post op shoe for support. Return to the ED if you experience severe worsening of your symptoms, loss of consciousness, blurry vision, abdominal pain, loss of control of your bowel or bladder, numbness or tingling in an extremity.

## 2015-08-21 NOTE — ED Notes (Signed)
Pt states that he fell off of his scooter yesterday. Now c/o lower back pain and L great toe pain. Alert and oriented.

## 2015-08-21 NOTE — ED Notes (Signed)
Pt named called twice to be roomed, no answer
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
## 2015-08-21 NOTE — ED Notes (Signed)
PT DISCHARGED. INSTRUCTIONS AND PRESCRIPTIONS GIVEN. AAOX3. PT IN NO APPARENT DISTRESS. THE OPPORTUNITY TO ASK QUESTIONS WAS PROVIDED. 

## 2015-09-04 ENCOUNTER — Ambulatory Visit (HOSPITAL_COMMUNITY)
Admission: EM | Admit: 2015-09-04 | Discharge: 2015-09-04 | Disposition: A | Discharge status: Home / Self Care | Payer: Managed Care, Other (non HMO) | Attending: Family Medicine | Admitting: Family Medicine

## 2015-09-04 ENCOUNTER — Encounter (HOSPITAL_COMMUNITY): Payer: Self-pay | Admitting: Emergency Medicine

## 2015-09-04 DIAGNOSIS — S99922D Unspecified injury of left foot, subsequent encounter: Secondary | ICD-10-CM

## 2015-09-04 MED ORDER — OXYCODONE-ACETAMINOPHEN 5-325 MG PO TABS: 1.0000 | ORAL_TABLET | ORAL | Status: DC | PRN

## 2015-09-04 NOTE — ED Notes (Signed)
No answer in lobby.
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
## 2015-09-04 NOTE — Discharge Instructions (Signed)
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
## 2015-11-11 ENCOUNTER — Emergency Department (HOSPITAL_COMMUNITY)
Admission: EM | Admit: 2015-11-11 | Discharge: 2015-11-11 | Disposition: A | Discharge status: Home / Self Care | Payer: Managed Care, Other (non HMO) | Attending: Emergency Medicine | Admitting: Emergency Medicine

## 2015-11-11 DIAGNOSIS — J45909 Unspecified asthma, uncomplicated: Secondary | ICD-10-CM | POA: Insufficient documentation

## 2015-11-11 DIAGNOSIS — F17200 Nicotine dependence, unspecified, uncomplicated: Secondary | ICD-10-CM | POA: Insufficient documentation

## 2015-11-11 DIAGNOSIS — L0501 Pilonidal cyst with abscess: Secondary | ICD-10-CM | POA: Insufficient documentation

## 2015-11-11 DIAGNOSIS — Z79899 Other long term (current) drug therapy: Secondary | ICD-10-CM | POA: Insufficient documentation

## 2015-11-11 MED ORDER — HYDROMORPHONE HCL 1 MG/ML IJ SOLN
2.0000 mg | Freq: Once | INTRAMUSCULAR | Status: AC
  Administered 2015-11-11: 2 mg via INTRAMUSCULAR
  Filled 2015-11-11: qty 2

## 2015-11-11 MED ORDER — SULFAMETHOXAZOLE-TRIMETHOPRIM 800-160 MG PO TABS: 1.0000 | ORAL_TABLET | Freq: Two times a day (BID) | ORAL | 0 refills | Status: AC

## 2015-11-11 MED ORDER — ONDANSETRON 4 MG PO TBDP
8.0000 mg | ORAL_TABLET | Freq: Once | ORAL | Status: AC
  Administered 2015-11-11: 8 mg via ORAL
  Filled 2015-11-11: qty 2

## 2015-11-11 MED ORDER — OXYCODONE-ACETAMINOPHEN 5-325 MG PO TABS: 1.0000 | ORAL_TABLET | ORAL | 0 refills | Status: DC | PRN

## 2015-11-11 MED ORDER — LIDOCAINE-EPINEPHRINE (PF) 2 %-1:200000 IJ SOLN
20.0000 mL | Freq: Once | INTRAMUSCULAR | Status: AC
  Administered 2015-11-11: 20 mL
  Filled 2015-11-11: qty 20

## 2015-11-11 NOTE — ED Provider Notes (Signed)
[facility]-EMERGENCY DEPT Provider Note   CSN: [id number] Arrival date & time: 11/11/15  0144  First Provider Contact:  None       History   Chief Complaint No chief complaint on file.   HPI Martin Crawford is a 40 y.o. male.  The history is provided by the patient.     Patient presents with pain and swelling over his buttock that began 5 days ago.  Associated subjective fever, chills.  Has had this previously 9 months ago and was I&D at that time.     Past Medical History:  Diagnosis Date  . Asthma   . Chronic back pain     There are no active problems to display for this patient.   No past surgical history on file.     Home Medications    Prior to Admission medications   Medication Sig Start Date End Date Taking? Authorizing Provider  acetaminophen (TYLENOL) 500 MG tablet Take 1,000 mg by mouth every 6 (six) hours as needed for mild pain.    Historical Provider, MD  cyclobenzaprine (FLEXERIL) 10 MG tablet Take 1 tablet (10 mg total) by mouth 2 (two) times daily as needed for muscle spasms. 04/20/15   Felicie Morn, NP  cyclobenzaprine (FLEXERIL) 10 MG tablet Take 1 tablet (10 mg total) by mouth 2 (two) times daily as needed for muscle spasms. 08/21/15   Samantha Tripp Dowless, PA-C  Menthol, Topical Analgesic, 5 % PADS Apply 1 each topically daily.    Historical Provider, MD  naproxen (NAPROSYN) 500 MG tablet Take 1 tablet (500 mg total) by mouth 2 (two) times daily. 04/20/15   Felicie Morn, NP  oxyCODONE-acetaminophen (PERCOCET/ROXICET) 5-325 MG per tablet Take 1 tablet by mouth every 6 (six) hours as needed for severe pain. Patient not taking: Reported on 11/19/2014 06/26/14   Linwood Dibbles, MD  oxyCODONE-acetaminophen (PERCOCET/ROXICET) 5-325 MG tablet Take 1 tablet by mouth every 4 (four) hours as needed for severe pain. 09/04/15   Tharon Aquas, PA  traMADol (ULTRAM) 50 MG tablet Take 1 tablet (50 mg total) by mouth every 6 (six) hours as needed. 08/21/15   Samantha Tripp  Dowless, PA-C    Family History No family history on file.  Social History Social History  Substance Use Topics  . Smoking status: Current Every Day Smoker    Packs/day: 2.00    Years: 7.00    Types: Cigarettes  . Smokeless tobacco: Not on file  . Alcohol use Yes     Allergies   Review of patient's allergies indicates no known allergies.   Review of Systems Review of Systems  Constitutional: Positive for chills and fever.  Gastrointestinal: Negative for abdominal pain, constipation and diarrhea.  Genitourinary: Negative for dysuria.  Musculoskeletal: Negative for back pain and myalgias.  Allergic/Immunologic: Negative for immunocompromised state.  Hematological: Does not bruise/bleed easily.  Psychiatric/Behavioral: Negative for self-injury.     Physical Exam Updated Vital Signs BP 115/68 (BP Location: Right Arm)   Pulse 83   Temp 98.2 F (36.8 C) (Oral)   Resp 16   Ht 6' (1.829 m)   Wt 95.7 kg   SpO2 98%   BMI 28.62 kg/m   Physical Exam  Constitutional: He appears well-developed and well-nourished.  HENT:  Head: Normocephalic and atraumatic.  Neck: Neck supple.  Pulmonary/Chest: Effort normal.  Neurological: He is alert.  Skin:  Tenderness, fluctuance, slight erythema overlying gluteal cleft.  No drainage.    Nursing note and vitals reviewed.  ED Treatments / Results  Labs (all labs ordered are listed, but only abnormal results are displayed) Labs Reviewed - No data to display  EKG  EKG Interpretation None       Radiology No results found.  Procedures Procedures (including critical care time)  Medications Ordered in ED Medications - No data to display   Initial Impression / Assessment and Plan / ED Course  I have reviewed the triage vital signs and the nursing notes.  Pertinent labs & imaging results that were available during my care of the patient were reviewed by me and considered in my medical decision making (see chart for  details).  Clinical Course   INCISION AND DRAINAGE Performed by: Trixie Dredge Consent: Verbal consent obtained. Risks and benefits: risks, benefits and alternatives were discussed Type: abscess  Body area: gluteal cleft   Anesthesia: local infiltration  Incision was made with a scalpel.  Local anesthetic: lidocaine 1% with epinephrine  Anesthetic total: 8 ml  Complexity: complex Blunt dissection to break up loculations  Drainage: purulent  Drainage amount: large   Packing material: none  Thoroughly irrigated with normal saline.    Initial incision too inferior, reassessed with ultrasound.  Absorbable sutures placed in initial incision.   Patient tolerance: Patient tolerated the procedure well with no immediate complications.    EMERGENCY DEPARTMENT US SOFT TISSUE INTERPRETATION "Study: Limited Ultrasound of the noted body part in comments below"  INDICATIONS: Pain Multiple views of the body part are obtained with a multi-frequency linear probe  PERFORMED BY:  Myself  IMAGES ARCHIVED?: Yes  SIDE:Left  BODY PART:Pelvic wall  FINDINGS: Abcess present  LIMITATIONS:  Body Habitus and Emergent Procedure  INTERPRETATION:  Abcess present  COMMENT:  Pilonidal abscess    Afebrile, nontoxic patient with pilonidal abscess.  I&D successful, pt feeling great relief.   D/C home with bactrim, percocet, home care, return precautions.  Discussed result, findings, treatment, and follow up  with patient.  Pt given return precautions.  Pt verbalizes understanding and agrees with plan.       Final Clinical Impressions(s) / ED Diagnoses   Final diagnoses:  Pilonidal abscess    New Prescriptions Discharge Medication List as of 11/11/2015  9:56 AM    START taking these medications   Details  sulfamethoxazole-trimethoprim (BACTRIM DS,SEPTRA DS) 800-160 MG tablet Take 1 tablet by mouth 2 (two) times daily., Starting Sun 11/11/2015, Until Sun 11/18/2015, Print           [place], PA-C 11/11/15 1041    Maia Plan, MD 11/11/15 [phone]

## 2015-11-11 NOTE — Discharge Instructions (Signed)
Read the information below.  Use the prescribed medication as directed.  Please discuss all new medications with your pharmacist.  Do not take additional tylenol while taking the prescribed pain medication to avoid overdose.  You may return to the Emergency Department at any time for worsening condition or any new symptoms that concern you.     If you develop redness, swelling, worsening pain, or fevers greater than 100.4, return to the ER immediately for a recheck.

## 2016-01-06 ENCOUNTER — Emergency Department (HOSPITAL_COMMUNITY): Payer: No Typology Code available for payment source

## 2016-01-06 ENCOUNTER — Emergency Department (HOSPITAL_COMMUNITY)
Admission: EM | Admit: 2016-01-06 | Discharge: 2016-01-06 | Disposition: A | Discharge status: Home / Self Care | Payer: No Typology Code available for payment source | Attending: Emergency Medicine | Admitting: Emergency Medicine

## 2016-01-06 ENCOUNTER — Encounter (HOSPITAL_COMMUNITY): Payer: Self-pay

## 2016-01-06 DIAGNOSIS — J45909 Unspecified asthma, uncomplicated: Secondary | ICD-10-CM | POA: Insufficient documentation

## 2016-01-06 DIAGNOSIS — Y999 Unspecified external cause status: Secondary | ICD-10-CM | POA: Diagnosis not present

## 2016-01-06 DIAGNOSIS — Y939 Activity, unspecified: Secondary | ICD-10-CM | POA: Insufficient documentation

## 2016-01-06 DIAGNOSIS — F17210 Nicotine dependence, cigarettes, uncomplicated: Secondary | ICD-10-CM | POA: Insufficient documentation

## 2016-01-06 DIAGNOSIS — Y92410 Unspecified street and highway as the place of occurrence of the external cause: Secondary | ICD-10-CM | POA: Insufficient documentation

## 2016-01-06 DIAGNOSIS — S9032XA Contusion of left foot, initial encounter: Secondary | ICD-10-CM

## 2016-01-06 DIAGNOSIS — S99922A Unspecified injury of left foot, initial encounter: Secondary | ICD-10-CM | POA: Diagnosis present

## 2016-01-06 MED ORDER — IBUPROFEN 800 MG PO TABS: 800.0000 mg | ORAL_TABLET | Freq: Three times a day (TID) | ORAL | 0 refills | Status: DC | PRN
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
## 2016-01-06 NOTE — ED Notes (Signed)
Declined W/C at D/C and was escorted to lobby by RN. 

## 2016-01-06 NOTE — Discharge Instructions (Signed)
Read the information below.  Use the prescribed medication as directed.  Please discuss all new medications with your pharmacist.  You may return to the Emergency Department at any time for worsening condition or any new symptoms that concern you.   If you develop uncontrolled pain, weakness or numbness of the extremity, severe discoloration of the skin, or you are unable to move your toes or ankle, return to the ER for a recheck.
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
## 2016-03-01 ENCOUNTER — Emergency Department (HOSPITAL_COMMUNITY)
Admission: EM | Admit: 2016-03-01 | Discharge: 2016-03-02 | Disposition: A | Discharge status: Home / Self Care | Payer: Managed Care, Other (non HMO) | Attending: Emergency Medicine | Admitting: Emergency Medicine

## 2016-03-01 ENCOUNTER — Encounter (HOSPITAL_COMMUNITY): Payer: Self-pay | Admitting: Emergency Medicine

## 2016-03-01 DIAGNOSIS — J45909 Unspecified asthma, uncomplicated: Secondary | ICD-10-CM | POA: Insufficient documentation

## 2016-03-01 DIAGNOSIS — F17210 Nicotine dependence, cigarettes, uncomplicated: Secondary | ICD-10-CM | POA: Insufficient documentation

## 2016-03-01 DIAGNOSIS — Z79899 Other long term (current) drug therapy: Secondary | ICD-10-CM | POA: Insufficient documentation

## 2016-03-01 DIAGNOSIS — R112 Nausea with vomiting, unspecified: Secondary | ICD-10-CM | POA: Insufficient documentation

## 2016-03-01 MED ORDER — ONDANSETRON 8 MG PO TBDP
8.0000 mg | ORAL_TABLET | Freq: Once | ORAL | Status: AC
  Administered 2016-03-02: 8 mg via ORAL
  Filled 2016-03-01: qty 1

## 2016-03-01 NOTE — ED Triage Notes (Signed)
Pt family member reports pt having vomiting that started at 0600 this morning and has not improved. Pt denies any diarrhea. Pt reports generalized weakness

## 2016-03-01 NOTE — ED Provider Notes (Signed)
[facility]-EMERGENCY DEPT Provider Note   CSN: [id number] Arrival date & time: 03/01/16  2230 By signing my name below, I, Linus GalasMaharshi Patel, attest that this documentation has been prepared under the direction and in the presence of Lyndal Pulleyaniel Zeena Starkel, MD. Electronically Signed: Linus GalasMaharshi Patel, ED Scribe. 03/01/16. 11:34 PM.  History   Chief Complaint Chief Complaint  Patient presents with  . Emesis   The history is provided by the patient and the spouse. No language interpreter was used.   HPI Comments: Martin Crawford is a 40 y.o. male who presents to the Emergency Department with no pertinent PMHx complaining of a sudden onset of emesis that began this morning. Pt ate take-out chinese food last night including chicken and rice. He felt fine last not however since this morning, he has had 6-7 episode of emesis. Pt has been unable tolerate anything PO today without vomiting it back up. Wife state the pt is also "dry heaving" since he no longer has any stomach contents to expell. Pt denies any fevers, abdominal pain, diarrhea, blood in stools, hematemesis, or any other symptoms at this time. Pt denies any sick contacts.   Past Medical History:  Diagnosis Date  . Asthma   . Chronic back pain    There are no active problems to display for this patient.  History reviewed. No pertinent surgical history.  Home Medications    Prior to Admission medications   Medication Sig Start Date End Date Taking? Authorizing Provider  acetaminophen (TYLENOL) 500 MG tablet Take 1,000 mg by mouth every 6 (six) hours as needed for mild pain.   Yes Historical Provider, MD  Menthol, Topical Analgesic, 5 % PADS Apply 1 each topically daily.   Yes Historical Provider, MD  ibuprofen (ADVIL,MOTRIN) 800 MG tablet Take 1 tablet (800 mg total) by mouth every 8 (eight) hours as needed for mild pain or moderate pain. Patient not taking: Reported on 03/01/2016 01/06/16   Trixie DredgeEmily West, PA-C   Family History History reviewed. No  pertinent family history.  Social History Social History  Substance Use Topics  . Smoking status: Current Every Day Smoker    Packs/day: 1.00    Years: 7.00    Types: Cigarettes  . Smokeless tobacco: Never Used  . Alcohol use Yes   Allergies   Patient has no known allergies.  Review of Systems Review of Systems  Constitutional: Negative for chills and fever.  Respiratory: Negative for shortness of breath.   Cardiovascular: Negative for chest pain.  Gastrointestinal: Positive for vomiting. Negative for abdominal pain, blood in stool and diarrhea.  All other systems reviewed and are negative.  Physical Exam Updated Vital Signs BP 120/79 (BP Location: Left Arm)   Pulse 103   Temp 97.6 F (36.4 C) (Oral)   Resp 18   Ht 5\' 9"  (1.753 m)   Wt 210 lb (95.3 kg)   SpO2 99%   BMI 31.01 kg/m   Physical Exam  Constitutional: He is oriented to person, place, and time. He appears well-developed and well-nourished. No distress.  HENT:  Head: Normocephalic and atraumatic.  Nose: Nose normal.  Eyes: Conjunctivae are normal.  Neck: Neck supple. No tracheal deviation present.  Cardiovascular: Normal rate and regular rhythm.   No murmur heard. Pulmonary/Chest: Effort normal and breath sounds normal. No respiratory distress.  Abdominal: Soft. Bowel sounds are normal. He exhibits no distension.  Musculoskeletal: He exhibits no edema.  Neurological: He is alert and oriented to person, place, and time.  Skin: Skin  is warm and dry.  Psychiatric: He has a normal mood and affect.   ED Treatments / Results  DIAGNOSTIC STUDIES: Oxygen Saturation is 99% on room air, normal by my interpretation.    COORDINATION OF CARE: 11:34 PM Discussed treatment plan with pt at bedside and pt agreed to plan.  Labs (all labs ordered are listed, but only abnormal results are displayed) Labs Reviewed - No data to display  EKG  EKG Interpretation None       Radiology No results  found.  Procedures Procedures (including critical care time)  Medications Ordered in ED Medications  ondansetron (ZOFRAN-ODT) disintegrating tablet 8 mg (8 mg Oral Given 03/02/16 0015)    Initial Impression / Assessment and Plan / ED Course  I have reviewed the triage vital signs and the nursing notes.  Pertinent labs & imaging results that were available during my care of the patient were reviewed by me and considered in my medical decision making (see chart for details).  Clinical Course    40 year old male presents with nausea and vomiting over the course of the day. Last night he ate [place] food including fried rice and after waking up early this morning he has had persistent nausea and vomiting 6 which has not gotten any better.  The patient was given ODT Zofran 8 mg and was by mouth challenged, was able to know longer vomited. He is not clinically dehydrated to the point that would require parenteral fluid administration. This is likely a self-limited illness. No sick contacts, no recent travel or antibiotics to make him high risk for an infectious etiology. Plan to follow up with PCP as needed and return precautions discussed for worsening or new concerning symptoms.   Final Clinical Impressions(s) / ED Diagnoses   Final diagnoses:  Non-intractable vomiting with nausea, unspecified vomiting type    New Prescriptions New Prescriptions   No medications on file   I personally performed the services described in this documentation, which was scribed in my presence. The recorded information has been reviewed and is accurate.      Lyndal Pulleyaniel Edmund Rick, MD 03/02/16 [phone]

## 2016-06-24 ENCOUNTER — Ambulatory Visit (HOSPITAL_COMMUNITY)
Admission: EM | Admit: 2016-06-24 | Discharge: 2016-06-24 | Disposition: A | Discharge status: Home / Self Care | Payer: Self-pay | Attending: Family Medicine | Admitting: Family Medicine

## 2016-06-24 ENCOUNTER — Encounter (HOSPITAL_COMMUNITY): Payer: Self-pay | Admitting: Family Medicine

## 2016-06-24 DIAGNOSIS — M545 Low back pain, unspecified: Secondary | ICD-10-CM

## 2016-06-24 MED ORDER — NAPROXEN 500 MG PO TABS: 500.0000 mg | ORAL_TABLET | Freq: Two times a day (BID) | ORAL | 0 refills | Status: DC

## 2016-06-24 MED ORDER — CYCLOBENZAPRINE HCL 10 MG PO TABS: 10.0000 mg | ORAL_TABLET | Freq: Two times a day (BID) | ORAL | 0 refills | Status: DC | PRN
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
## 2016-09-16 ENCOUNTER — Emergency Department (HOSPITAL_COMMUNITY): Payer: Self-pay

## 2016-09-16 ENCOUNTER — Encounter (HOSPITAL_COMMUNITY): Payer: Self-pay | Admitting: Obstetrics and Gynecology

## 2016-09-16 ENCOUNTER — Inpatient Hospital Stay (HOSPITAL_COMMUNITY)
Admission: EM | Admit: 2016-09-16 | Discharge: 2016-09-18 | DRG: 872 | Disposition: A | Discharge status: Home / Self Care | Payer: Self-pay | Attending: Family Medicine | Admitting: Family Medicine

## 2016-09-16 DIAGNOSIS — Z720 Tobacco use: Secondary | ICD-10-CM

## 2016-09-16 DIAGNOSIS — F17210 Nicotine dependence, cigarettes, uncomplicated: Secondary | ICD-10-CM | POA: Diagnosis present

## 2016-09-16 DIAGNOSIS — G8929 Other chronic pain: Secondary | ICD-10-CM | POA: Diagnosis present

## 2016-09-16 DIAGNOSIS — Z79899 Other long term (current) drug therapy: Secondary | ICD-10-CM

## 2016-09-16 DIAGNOSIS — Z792 Long term (current) use of antibiotics: Secondary | ICD-10-CM

## 2016-09-16 DIAGNOSIS — L0231 Cutaneous abscess of buttock: Secondary | ICD-10-CM | POA: Diagnosis present

## 2016-09-16 DIAGNOSIS — J4520 Mild intermittent asthma, uncomplicated: Secondary | ICD-10-CM | POA: Diagnosis present

## 2016-09-16 DIAGNOSIS — D582 Other hemoglobinopathies: Secondary | ICD-10-CM | POA: Diagnosis present

## 2016-09-16 DIAGNOSIS — L03317 Cellulitis of buttock: Secondary | ICD-10-CM | POA: Diagnosis present

## 2016-09-16 DIAGNOSIS — M549 Dorsalgia, unspecified: Secondary | ICD-10-CM | POA: Diagnosis present

## 2016-09-16 DIAGNOSIS — A419 Sepsis, unspecified organism: Principal | ICD-10-CM | POA: Diagnosis present

## 2016-09-16 DIAGNOSIS — J45909 Unspecified asthma, uncomplicated: Secondary | ICD-10-CM | POA: Diagnosis present

## 2016-09-16 LAB — COMPREHENSIVE METABOLIC PANEL
ALBUMIN: 3.8 g/dL (ref 3.5–5.0)
ALT: 27 U/L (ref 17–63)
AST: 23 U/L (ref 15–41)
Alkaline Phosphatase: 91 U/L (ref 38–126)
Anion gap: 10 (ref 5–15)
BUN: 9 mg/dL (ref 6–20)
CHLORIDE: 104 mmol/L (ref 101–111)
CO2: 24 mmol/L (ref 22–32)
Calcium: 9.4 mg/dL (ref 8.9–10.3)
Creatinine, Ser: 0.99 mg/dL (ref 0.61–1.24)
GFR calc Af Amer: >60 mL/min (ref >60)
GFR calc non Af Amer: >60 mL/min (ref >60)
GLUCOSE: 110 mg/dL — AB (ref 65–99)
POTASSIUM: 4.1 mmol/L (ref 3.5–5.1)
Sodium: 138 mmol/L (ref 135–145)
Total Bilirubin: 0.8 mg/dL (ref 0.3–1.2)
Total Protein: 7.7 g/dL (ref 6.5–8.1)

## 2016-09-16 LAB — CBC WITH DIFFERENTIAL/PLATELET
Basophils Absolute: 0 10*3/uL (ref 0.0–0.1)
Basophils Relative: 0 %
EOS PCT: 1 %
Eosinophils Absolute: 0.1 10*3/uL (ref 0.0–0.7)
HEMATOCRIT: 47.9 % (ref 39.0–52.0)
Hemoglobin: 17.3 g/dL — ABNORMAL HIGH (ref 13.0–17.0)
LYMPHS ABS: 1.7 10*3/uL (ref 0.7–4.0)
LYMPHS PCT: 6 %
MCH: 22.1 pg — AB (ref 26.0–34.0)
MCHC: 36.1 g/dL — AB (ref 30.0–36.0)
MCV: 61.2 fL — AB (ref 78.0–100.0)
MONO ABS: 2.3 10*3/uL — AB (ref 0.1–1.0)
MONOS PCT: 8 %
Neutro Abs: 26.7 10*3/uL — ABNORMAL HIGH (ref 1.7–7.7)
Neutrophils Relative %: 86 %
Platelets: 251 10*3/uL (ref 150–400)
RBC: 7.83 MIL/uL — ABNORMAL HIGH (ref 4.22–5.81)
RDW: 16.1 % — AB (ref 11.5–15.5)
WBC: 30.9 10*3/uL — ABNORMAL HIGH (ref 4.0–10.5)

## 2016-09-16 LAB — I-STAT CG4 LACTIC ACID, ED: LACTIC ACID, VENOUS: 1.25 mmol/L (ref 0.5–1.9)

## 2016-09-16 MED ORDER — IOPAMIDOL (ISOVUE-300) INJECTION 61%
75.0000 mL | Freq: Once | INTRAVENOUS | Status: AC | PRN
  Administered 2016-09-16: 75 mL via INTRAVENOUS

## 2016-09-16 MED ORDER — SODIUM CHLORIDE 0.9 % IV BOLUS (SEPSIS)
1000.0000 mL | Freq: Once | INTRAVENOUS | Status: AC
  Administered 2016-09-16: 1000 mL via INTRAVENOUS

## 2016-09-16 MED ORDER — SODIUM CHLORIDE 0.9 % IV BOLUS (SEPSIS)
500.0000 mL | Freq: Once | INTRAVENOUS | Status: AC
  Administered 2016-09-16: 500 mL via INTRAVENOUS

## 2016-09-16 MED ORDER — ACETAMINOPHEN 325 MG PO TABS
650.0000 mg | ORAL_TABLET | Freq: Once | ORAL | Status: AC
Start: 2016-09-16 — End: 2016-09-16
  Administered 2016-09-16: 650 mg via ORAL
  Filled 2016-09-16: qty 2

## 2016-09-16 MED ORDER — HYDROMORPHONE HCL 1 MG/ML IJ SOLN
1.0000 mg | Freq: Once | INTRAMUSCULAR | Status: AC
  Administered 2016-09-16: 1 mg via INTRAVENOUS
  Filled 2016-09-16: qty 1

## 2016-09-16 MED ORDER — CLINDAMYCIN PHOSPHATE 600 MG/50ML IV SOLN
600.0000 mg | Freq: Once | INTRAVENOUS | Status: AC
  Administered 2016-09-16: 600 mg via INTRAVENOUS
  Filled 2016-09-16: qty 50

## 2016-09-16 MED ORDER — IOPAMIDOL (ISOVUE-300) INJECTION 61%
INTRAVENOUS | Status: AC
  Administered 2016-09-16: 75 mL via INTRAVENOUS
  Filled 2016-09-16: qty 100

## 2016-09-16 NOTE — ED Notes (Signed)
Patient transported to CT 

## 2016-09-16 NOTE — ED Notes (Signed)
Bed: WA04 Expected date:  Expected time:  Means of arrival:  Comments: 

## 2016-09-16 NOTE — ED Triage Notes (Signed)
Pt states he has a boil coming up on his bottom and it has happened before. Pt states the boil is right in the gluteal folds.

## 2016-09-16 NOTE — ED Provider Notes (Signed)
MSE was initiated and I personally evaluated the patient and placed orders (if any) at  9:30 PM on Sep 16, 2016.  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 He has had worsening pain and swelling on the gluteal cleft over the past week. No drainage from the area but he has had a fever. No difficulty having a BM. Temperature in the ED was noted to be 102.2. Pilonidal abscess appears large and will possibly need drainage in the OR. Will initiate sepsis labs, fluids, antibiotics, and pain medicine and transfer to main ED for further evaluation.  The patient appears stable so that the remainder of the MSE may be completed by another provider.   Bethel BornGekas, Ivalee Strauser Marie, PA-C 09/16/16 2133    Arby BarrettePfeiffer, Marcy, MD 09/19/16 [phone]

## 2016-09-16 NOTE — ED Provider Notes (Signed)
[facility]-EMERGENCY DEPT Provider Note   CSN: [id number] Arrival date & time: 09/16/16  1929  By signing my name below, I, Ny'Kea Lewis, attest that this documentation has been prepared under the direction and in the presence of TRW AutomotiveKelly Omero Kowal, PA-C. Electronically Signed: Karren CobbleNy'Kea Lewis, ED Scribe. 09/16/16. 10:35 PM.  History   Chief Complaint Chief Complaint  Patient presents with  . Recurrent Skin Infections   The history is provided by the patient and the spouse. No language interpreter was used.    HPI Comments: Martin Crawford is a 41 y.o. male who presents to the Emergency Department complaining of a moderate, gradually worsening area of pain and swelling to the inner gluteal fold onset one week ago. He notes associated subjective fever and chills. He reports reoccurring abscess and notes the last time he was seen the area was drained in the ED. Pt states pain is exacerbated with palpation and direct pressure. He has had tried Ibuprofen and Cephalexin with mild relief. His pain is currently a 10/10. Denies drainage from the area.    Past Medical History:  Diagnosis Date  . Asthma   . Chronic back pain     Patient Active Problem List   Diagnosis Date Noted  . Abscess and cellulitis of gluteal region 09/17/2016  . Sepsis (HCC) 09/17/2016  . Chronic back pain   . Asthma     History reviewed. No pertinent surgical history.     Home Medications    Prior to Admission medications   Medication Sig Start Date End Date Taking? Authorizing Provider  acetaminophen (TYLENOL) 500 MG tablet Take 1,000 mg by mouth every 6 (six) hours as needed for mild pain.   Yes [provider]  cyclobenzaprine (FLEXERIL) 10 MG tablet Take 1 tablet (10 mg total) by mouth 2 (two) times daily as needed for muscle spasms. Patient not taking: Reported on 09/16/2016 06/24/16   Deatra Canterxford, William J, FNP  ibuprofen (ADVIL,MOTRIN) 800 MG tablet Take 1 tablet (800 mg total) by mouth every 8 (eight) hours as  needed for mild pain or moderate pain. Patient not taking: Reported on 03/01/2016 01/06/16   Trixie DredgeWest, Emily, PA-C  naproxen (NAPROSYN) 500 MG tablet Take 1 tablet (500 mg total) by mouth 2 (two) times daily with a meal. Patient not taking: Reported on 09/16/2016 06/24/16   Deatra Canterxford, William J, FNP    Family History History reviewed. No pertinent family history.  Social History Social History  Substance Use Topics  . Smoking status: Current Every Day Smoker    Packs/day: 1.00    Years: 28.00    Types: Cigarettes  . Smokeless tobacco: Never Used  . Alcohol use Yes     Allergies   Patient has no known allergies.   Review of Systems Review of Systems  Constitutional: Positive for chills and fever.  Skin: Positive for wound.  A complete 10 system review of systems was obtained and all systems are negative except as noted in the HPI and PMH. .   Physical Exam Updated Vital Signs BP 127/76 (BP Location: Right Arm)   Pulse 92   Resp 16   Ht 5\' 9"  (1.753 m)   Wt 104.3 kg (230 lb)   SpO2 96%   BMI 33.97 kg/m   Physical Exam  Constitutional: He is oriented to person, place, and time. He appears well-developed and well-nourished. No distress.  Nontoxic and in NAD  HENT:  Head: Normocephalic and atraumatic.  Eyes: Conjunctivae and EOM are normal. No scleral  icterus.  Neck: Normal range of motion.  No meningismus  Cardiovascular: Regular rhythm and intact distal pulses.   Mild tachycardia  Pulmonary/Chest: Effort normal. No respiratory distress. He has no wheezes. He has no rales.  Lungs clear to auscultation bilaterally. Chest expansion symmetric.  Genitourinary:     Genitourinary Comments: No perianal TTP. Exam chaperoned by scribe.  Musculoskeletal: Normal range of motion.  Neurological: He is alert and oriented to person, place, and time. He exhibits normal muscle tone. Coordination normal.  Skin: Skin is warm and dry. No rash noted. He is not diaphoretic. No erythema. No  pallor.  Psychiatric: He has a normal mood and affect. His behavior is normal.  Nursing note and vitals reviewed.    ED Treatments / Results  DIAGNOSTIC STUDIES: Oxygen Saturation is 96% on RA, adequate by my interpretation.   COORDINATION OF CARE: 10:31 PM-Discussed next steps with pt. Pt verbalized understanding and is agreeable with the plan.   Labs (all labs ordered are listed, but only abnormal results are displayed) Labs Reviewed  COMPREHENSIVE METABOLIC PANEL - Abnormal; Notable for the following:       Result Value   Glucose, Bld 110 (*)    All other components within normal limits  CBC WITH DIFFERENTIAL/PLATELET - Abnormal; Notable for the following:    WBC 30.9 (*)    RBC 7.83 (*)    Hemoglobin 17.3 (*)    MCV 61.2 (*)    MCH 22.1 (*)    MCHC 36.1 (*)    RDW 16.1 (*)    Neutro Abs 26.7 (*)    Monocytes Absolute 2.3 (*)    All other components within normal limits  CULTURE, BLOOD (ROUTINE X 2)  CULTURE, BLOOD (ROUTINE X 2)  URINALYSIS, ROUTINE W REFLEX MICROSCOPIC  I-STAT CG4 LACTIC ACID, ED  I-STAT CG4 LACTIC ACID, ED    EKG  EKG Interpretation  Date/Time:  Tuesday Sep 16 2016 23:06:23 EDT Ventricular Rate:  117 PR Interval:    QRS Duration: 91 QT Interval:  325 QTC Calculation: 454 R Axis:   87 Text Interpretation:  Sinus tachycardia Left atrial enlargement RSR' in V1 or V2, probably normal variant agree. no change from prevoius Confirmed by Arby Barrette [phone]) on 09/17/2016 1:37:45 AM       Radiology Ct Pelvis W Contrast  Result Date: 09/16/2016 CLINICAL DATA:  Worsening pain in gluteal fold pain and swelling, onset 1 week ago. EXAM: CT PELVIS WITH CONTRAST TECHNIQUE: Multidetector CT imaging of the pelvis was performed using the standard protocol following the bolus administration of intravenous contrast. CONTRAST:  75mL ISOVUE-300 IOPAMIDOL (ISOVUE-300) INJECTION 61% COMPARISON:  08/02/2013 CT FINDINGS: Urinary Tract: Mild ectasia of the  distal left ureter without obstructing mass or calculus. The urinary bladder is physiologically distended. Bowel: No acute bowel inflammation or obstruction. Normal-appearing appendix. Vascular/Lymphatic: Mild atherosclerotic vascular calcifications along the visualized common iliac arteries and branch vessels. No aneurysmal dilatation. No lymphadenopathy Reproductive: Fat noted within the right inguinal canal. Normal size prostate. Other: Similar to prior exam, there is an inflammatory phlegmonous mass within the subcutaneous soft tissues just deep to the natal cleft extending caudally along the left medial buttock and cephalad to the small pullback, overlying the erector spinae muscles. Potential small abscesses to the right and left of the natal cleft approximately 3 cm from the superior margin of the cleft measuring 5 x 1.1 cm. Musculoskeletal: No acute fracture nor bone destruction. IMPRESSION: Subcutaneous inflammatory phlegmonous masslike abnormality with possible small drainable  abscesses deep to the upper margin of the natal cleft measuring 5 x 1.1 cm with edema along the small of the back extending and overlying the visualized erector spinae muscles. Electronically Signed   By: Tollie Eth M.D.   On: 09/16/2016 23:45    Procedures Procedures (including critical care time)  Medications Ordered in ED Medications  sodium chloride 0.9 % bolus 1,000 mL (0 mLs Intravenous Stopped 09/16/16 2245)    And  sodium chloride 0.9 % bolus 1,000 mL (0 mLs Intravenous Stopped 09/16/16 2248)    And  sodium chloride 0.9 % bolus 1,000 mL (0 mLs Intravenous Stopped 09/17/16 0003)    And  sodium chloride 0.9 % bolus 500 mL (0 mLs Intravenous Stopped 09/16/16 2347)  HYDROmorphone (DILAUDID) injection 1 mg (1 mg Intravenous Given 09/16/16 2215)  clindamycin (CLEOCIN) IVPB 600 mg (0 mg Intravenous Stopped 09/16/16 2252)  acetaminophen (TYLENOL) tablet 650 mg (650 mg Oral Given 09/16/16 2217)  iopamidol (ISOVUE-300) 61 %  injection 75 mL (75 mLs Intravenous Contrast Given 09/16/16 2318)  lidocaine-EPINEPHrine (XYLOCAINE W/EPI) 2 %-1:200000 (PF) injection 20 mL (20 mLs Infiltration Given 09/17/16 0030)    INCISION AND DRAINAGE Performed by: Antony Madura Consent: Verbal consent obtained. Risks and benefits: risks, benefits and alternatives were discussed Type: abscess  Body area: left lateral aspect of intergluteal cleft  Anesthesia: local infiltration  Incision was made with a scalpel.  Local anesthetic: lidocaine 2% with epinephrine  Anesthetic total: 5 ml  Complexity: complex Blunt dissection to break up loculations  Drainage: bloody  Drainage amount: scant  Packing material: none  Patient tolerance: Patient tolerated the procedure well with no immediate complications.     Initial Impression / Assessment and Plan / ED Course  I have reviewed the triage vital signs and the nursing notes.  Pertinent labs & imaging results that were available during my care of the patient were reviewed by me and considered in my medical decision making (see chart for details).     41 year old male presents to the emergency department for evaluation of pain to his left buttock. He has history of pilonidal abscess. There is concern for same today. Patient noted to be febrile to 102.72F in triage. He was mildly tachycardic. Sepsis order set initiated in fast track by prior provider. IV clindamycin given.  CT pelvis completed which shows small fluid collection as well as inflammatory changes consistent with cellulitis. Bedside incision and drainage attempted, but no pus expelled. Symptoms appear to be solely due to cellulitis at this time. Given fever, tachycardia, and leukocytosis of almost 31k, will admit for additional antibiotics. Case discussed with Dr. Clyde Lundborg who will admit.   Final Clinical Impressions(s) / ED Diagnoses   Final diagnoses:  Cellulitis, gluteal, left  Mild intermittent asthma without  complication    New Prescriptions New Prescriptions   No medications on file    I personally performed the services described in this documentation, which was scribed in my presence. The recorded information has been reviewed and is accurate.       Antony Madura, PA-C 09/17/16 0244    Arby Barrette, MD 09/19/16 [phone]

## 2016-09-17 ENCOUNTER — Encounter (HOSPITAL_COMMUNITY): Payer: Self-pay | Admitting: Internal Medicine

## 2016-09-17 DIAGNOSIS — G8929 Other chronic pain: Secondary | ICD-10-CM | POA: Diagnosis present

## 2016-09-17 DIAGNOSIS — A419 Sepsis, unspecified organism: Secondary | ICD-10-CM | POA: Diagnosis present

## 2016-09-17 DIAGNOSIS — Z720 Tobacco use: Secondary | ICD-10-CM

## 2016-09-17 DIAGNOSIS — L0231 Cutaneous abscess of buttock: Secondary | ICD-10-CM | POA: Diagnosis present

## 2016-09-17 DIAGNOSIS — L03317 Cellulitis of buttock: Secondary | ICD-10-CM

## 2016-09-17 DIAGNOSIS — M549 Dorsalgia, unspecified: Secondary | ICD-10-CM

## 2016-09-17 DIAGNOSIS — J45909 Unspecified asthma, uncomplicated: Secondary | ICD-10-CM | POA: Diagnosis present

## 2016-09-17 DIAGNOSIS — J4520 Mild intermittent asthma, uncomplicated: Secondary | ICD-10-CM

## 2016-09-17 LAB — BASIC METABOLIC PANEL
Anion gap: 5 (ref 5–15)
BUN: 8 mg/dL (ref 6–20)
CHLORIDE: 107 mmol/L (ref 101–111)
CO2: 26 mmol/L (ref 22–32)
Calcium: 8.8 mg/dL — ABNORMAL LOW (ref 8.9–10.3)
Creatinine, Ser: 1.01 mg/dL (ref 0.61–1.24)
GFR calc Af Amer: >60 mL/min (ref >60)
Glucose, Bld: 106 mg/dL — ABNORMAL HIGH (ref 65–99)
POTASSIUM: 4 mmol/L (ref 3.5–5.1)
Sodium: 138 mmol/L (ref 135–145)

## 2016-09-17 LAB — URINALYSIS, ROUTINE W REFLEX MICROSCOPIC
Bilirubin Urine: NEGATIVE
GLUCOSE, UA: NEGATIVE mg/dL
Hgb urine dipstick: NEGATIVE
KETONES UR: NEGATIVE mg/dL
LEUKOCYTES UA: NEGATIVE
Nitrite: NEGATIVE
PROTEIN: NEGATIVE mg/dL
Specific Gravity, Urine: 1.015 (ref 1.005–1.030)
pH: 6 (ref 5.0–8.0)

## 2016-09-17 LAB — GLUCOSE, CAPILLARY: GLUCOSE-CAPILLARY: 105 mg/dL — AB (ref 65–99)

## 2016-09-17 LAB — HIV ANTIBODY (ROUTINE TESTING W REFLEX): HIV SCREEN 4TH GENERATION: NONREACTIVE

## 2016-09-17 LAB — PROTIME-INR
INR: 1.1
Prothrombin Time: 14.3 seconds (ref 11.4–15.2)

## 2016-09-17 LAB — CBC
HCT: 42.6 % (ref 39.0–52.0)
HEMOGLOBIN: 15.2 g/dL (ref 13.0–17.0)
MCH: 21.8 pg — ABNORMAL LOW (ref 26.0–34.0)
MCHC: 35.7 g/dL (ref 30.0–36.0)
MCV: 61.1 fL — ABNORMAL LOW (ref 78.0–100.0)
PLATELETS: 215 10*3/uL (ref 150–400)
RBC: 6.97 MIL/uL — AB (ref 4.22–5.81)
RDW: 15.9 % — ABNORMAL HIGH (ref 11.5–15.5)
WBC: 31 10*3/uL — AB (ref 4.0–10.5)

## 2016-09-17 LAB — I-STAT CG4 LACTIC ACID, ED: LACTIC ACID, VENOUS: 0.72 mmol/L (ref 0.5–1.9)

## 2016-09-17 LAB — C-REACTIVE PROTEIN: CRP: 8.6 mg/dL — ABNORMAL HIGH (ref <1.0)

## 2016-09-17 LAB — SEDIMENTATION RATE: Sed Rate: 2 mm/hr (ref 0–16)

## 2016-09-17 MED ORDER — SODIUM CHLORIDE 0.9% FLUSH
3.0000 mL | Freq: Two times a day (BID) | INTRAVENOUS | Status: DC
  Administered 2016-09-17 (×2): 3 mL via INTRAVENOUS

## 2016-09-17 MED ORDER — ONDANSETRON HCL 4 MG PO TABS: 4.0000 mg | ORAL_TABLET | Freq: Four times a day (QID) | ORAL | Status: DC | PRN

## 2016-09-17 MED ORDER — SODIUM CHLORIDE 0.9 % IV SOLN
INTRAVENOUS | Status: DC
  Administered 2016-09-17 – 2016-09-18 (×3): via INTRAVENOUS

## 2016-09-17 MED ORDER — ONDANSETRON HCL 4 MG/2ML IJ SOLN: 4.0000 mg | Freq: Four times a day (QID) | INTRAMUSCULAR | Status: DC | PRN

## 2016-09-17 MED ORDER — LIDOCAINE-EPINEPHRINE 2 %-1:200000 IJ SOLN
20.0000 mL | Freq: Once | INTRAMUSCULAR | Status: AC
Start: 2016-09-17 — End: 2016-09-17
  Administered 2016-09-17: 20 mL
  Filled 2016-09-17: qty 20

## 2016-09-17 MED ORDER — ACETAMINOPHEN 325 MG PO TABS: 650.0000 mg | ORAL_TABLET | Freq: Four times a day (QID) | ORAL | Status: DC | PRN

## 2016-09-17 MED ORDER — ALBUTEROL SULFATE (2.5 MG/3ML) 0.083% IN NEBU: 2.5000 mg | INHALATION_SOLUTION | RESPIRATORY_TRACT | Status: DC | PRN

## 2016-09-17 MED ORDER — MORPHINE SULFATE (PF) 2 MG/ML IV SOLN: 2.0000 mg | INTRAVENOUS | Status: DC | PRN

## 2016-09-17 MED ORDER — ZOLPIDEM TARTRATE 5 MG PO TABS: 5.0000 mg | ORAL_TABLET | Freq: Every evening | ORAL | Status: DC | PRN

## 2016-09-17 MED ORDER — NICOTINE 21 MG/24HR TD PT24
21.0000 mg | MEDICATED_PATCH | Freq: Every day | TRANSDERMAL | Status: DC
  Administered 2016-09-17 – 2016-09-18 (×2): 21 mg via TRANSDERMAL
  Filled 2016-09-17 (×2): qty 1

## 2016-09-17 MED ORDER — ACETAMINOPHEN 650 MG RE SUPP: 650.0000 mg | Freq: Four times a day (QID) | RECTAL | Status: DC | PRN

## 2016-09-17 MED ORDER — CLINDAMYCIN PHOSPHATE 600 MG/50ML IV SOLN
600.0000 mg | Freq: Three times a day (TID) | INTRAVENOUS | Status: DC
  Administered 2016-09-17 – 2016-09-18 (×4): 600 mg via INTRAVENOUS
  Filled 2016-09-17 (×6): qty 50

## 2016-09-17 MED ORDER — OXYCODONE-ACETAMINOPHEN 5-325 MG PO TABS
2.0000 | ORAL_TABLET | Freq: Four times a day (QID) | ORAL | Status: DC | PRN
  Administered 2016-09-17 – 2016-09-18 (×5): 2 via ORAL
  Filled 2016-09-17 (×5): qty 2

## 2016-09-17 NOTE — Progress Notes (Signed)
Patient seen and examined at bedside, patient admitted after midnight, please see earlier detailed admission note by Ivor Costa, MD. Briefly, patient presented with a gluteal abscess and met sepsis criteria. Initial I&D unsuccessful. Consulting general surgery, although, no appreciable fluctuance or erythema on my exam. Will obtain pathology smear for leukocytosis as this appears to be chronic.   Cordelia Poche, MD [facility] 09/17/2016, 11:00 AM Pager: [phone]

## 2016-09-17 NOTE — Consult Note (Signed)
[facility] Surgery Consult Note  Martin Crawford [birth year]  [id number].    Requesting MD: Lonny Prude, MD Chief Complaint/Reason for Consult: possible gluteal abscess   HPI:  Mr. Martin Crawford is a 41 year old African-American male with a past medical history of gluteal abscess who presented to [facility] emergency room yesterday with pain and swelling over his left buttock. Associated symptoms include fever and chills. He denies recent trauma to the area. At presentation the area was not actively draining. Physical examination emergency department was significant for left superior gluteal tenderness and induration. Workup significant for leukocytosis of 30 and CT abdomen pelvis showed inflammatory changes of the left superior buttock without organized abscess. Incision and drainage was attempted at the bedside by the emergency room physician however no purulent drainage was expressed. General surgery has been asked to consult regarding further surgical needs.  Today the patient denies a history of HIV or diabetes. He is a current smoker. He reports a gluteal abscess/cyst requiring incision and drainage in the OR in 2015.  ROS: Review of Systems  Constitutional: Positive for chills and fever.  Musculoskeletal:       Pain over left buttock    Family History  Problem Relation Age of Onset  . Stroke Mother   . Hypertension Mother   . Asthma Father   . Hypertension Father     Past Medical History:  Diagnosis Date  . Asthma   . Chronic back pain     Past Surgical History:  Procedure Laterality Date  . ABSCESS DRAINAGE     pilonidal abscess    Social History:  reports that he has been smoking Cigarettes.  He has a 28.00 pack-year smoking history. He has never used smokeless tobacco. He reports that he drinks alcohol. He reports that he does not use drugs.  Allergies: No Known Allergies  Medications Prior to Admission  Medication Sig Dispense Refill  . acetaminophen (TYLENOL)  500 MG tablet Take 1,000 mg by mouth every 6 (six) hours as needed for mild pain.    . cyclobenzaprine (FLEXERIL) 10 MG tablet Take 1 tablet (10 mg total) by mouth 2 (two) times daily as needed for muscle spasms. (Patient not taking: Reported on 09/16/2016) 20 tablet 0  . ibuprofen (ADVIL,MOTRIN) 800 MG tablet Take 1 tablet (800 mg total) by mouth every 8 (eight) hours as needed for mild pain or moderate pain. (Patient not taking: Reported on 03/01/2016) 15 tablet 0  . naproxen (NAPROSYN) 500 MG tablet Take 1 tablet (500 mg total) by mouth 2 (two) times daily with a meal. (Patient not taking: Reported on 09/16/2016) 20 tablet 0    Blood pressure (!) 151/81, pulse 95, temperature 98.1 F (36.7 C), temperature source Oral, resp. rate 20, height 5' 9"  (1.753 m), weight 104.3 kg (230 lb), SpO2 96 %. Physical Exam: Physical Exam  Constitutional: He is oriented to person, place, and time. He appears well-developed and well-nourished. No distress.  HENT:  Head: Normocephalic and atraumatic.  Eyes: EOM are normal. Pupils are equal, round, and reactive to light. Right eye exhibits no discharge. Left eye exhibits no discharge. No scleral icterus.  Neck: Normal range of motion. Neck supple. No tracheal deviation present. No thyromegaly present.  Cardiovascular: Normal rate, regular rhythm and normal heart sounds.  Exam reveals no gallop and no friction rub.   No murmur heard. Pulmonary/Chest: Effort normal and breath sounds normal. No respiratory distress. He has no wheezes. He has no rales.  Abdominal: Soft. Bowel sounds are  normal. He exhibits no distension and no mass. There is no tenderness. There is no guarding.  Genitourinary:  Genitourinary Comments: Possible left superior gluteal abscess. Tender to palpation with mild surrounding induration. Previous 2 cm incision noted. Probing of wound and applied pressure does not express any purulent fluid. Currently no palpable warmth.  Musculoskeletal: Normal  range of motion. He exhibits no edema or deformity.  Neurological: He is alert and oriented to person, place, and time. No sensory deficit.  Skin: Skin is warm and dry. No rash noted.  Psychiatric: He has a normal mood and affect. His behavior is normal.    Results for orders placed or performed during the hospital encounter of 09/16/16 (from the past 48 hour(s))  Comprehensive metabolic panel     Status: Abnormal   Collection Time: 09/16/16 10:00 PM  Result Value Ref Range   Sodium 138 135 - 145 mmol/L   Potassium 4.1 3.5 - 5.1 mmol/L   Chloride 104 101 - 111 mmol/L   CO2 24 22 - 32 mmol/L   Glucose, Bld 110 (H) 65 - 99 mg/dL   BUN 9 6 - 20 mg/dL   Creatinine, Ser 0.99 0.61 - 1.24 mg/dL   Calcium 9.4 8.9 - 10.3 mg/dL   Total Protein 7.7 6.5 - 8.1 g/dL   Albumin 3.8 3.5 - 5.0 g/dL   AST 23 15 - 41 U/L   ALT 27 17 - 63 U/L   Alkaline Phosphatase 91 38 - 126 U/L   Total Bilirubin 0.8 0.3 - 1.2 mg/dL   GFR calc non Af Amer >60 >60 mL/min   GFR calc Af Amer >60 >60 mL/min    Comment: (NOTE) The eGFR has been calculated using the CKD EPI equation. This calculation has not been validated in all clinical situations. eGFR's persistently <60 mL/min signify possible Chronic Kidney Disease.    Anion gap 10 5 - 15  CBC WITH DIFFERENTIAL     Status: Abnormal   Collection Time: 09/16/16 10:00 PM  Result Value Ref Range   WBC 30.9 (H) 4.0 - 10.5 K/uL   RBC 7.83 (H) 4.22 - 5.81 MIL/uL   Hemoglobin 17.3 (H) 13.0 - 17.0 g/dL   HCT 47.9 39.0 - 52.0 %   MCV 61.2 (L) 78.0 - 100.0 fL   MCH 22.1 (L) 26.0 - 34.0 pg   MCHC 36.1 (H) 30.0 - 36.0 g/dL   RDW 16.1 (H) 11.5 - 15.5 %   Platelets 251 150 - 400 K/uL    Comment: REPEATED TO VERIFY SPECIMEN CHECKED FOR CLOTS    Neutrophils Relative % 86 %   Neutro Abs 26.7 (H) 1.7 - 7.7 K/uL   Lymphocytes Relative 6 %   Lymphs Abs 1.7 0.7 - 4.0 K/uL   Monocytes Relative 8 %   Monocytes Absolute 2.3 (H) 0.1 - 1.0 K/uL   Eosinophils Relative 1 %    Eosinophils Absolute 0.1 0.0 - 0.7 K/uL   Basophils Relative 0 %   Basophils Absolute 0.0 0.0 - 0.1 K/uL   WBC Morphology WHITE COUNT CONFIRMED ON SMEAR   I-Stat CG4 Lactic Acid, ED  (not at  [facility])     Status: None   Collection Time: 09/16/16 10:13 PM  Result Value Ref Range   Lactic Acid, Venous 1.25 0.5 - 1.9 mmol/L  Urinalysis, Routine w reflex microscopic     Status: None   Collection Time: 09/16/16 11:46 PM  Result Value Ref Range   Color, Urine YELLOW YELLOW  APPearance CLEAR CLEAR   Specific Gravity, Urine 1.015 1.005 - 1.030   pH 6.0 5.0 - 8.0   Glucose, UA NEGATIVE NEGATIVE mg/dL   Hgb urine dipstick NEGATIVE NEGATIVE   Bilirubin Urine NEGATIVE NEGATIVE   Ketones, ur NEGATIVE NEGATIVE mg/dL   Protein, ur NEGATIVE NEGATIVE mg/dL   Nitrite NEGATIVE NEGATIVE   Leukocytes, UA NEGATIVE NEGATIVE  I-Stat CG4 Lactic Acid, ED  (not at  [facility])     Status: None   Collection Time: 09/17/16  1:37 AM  Result Value Ref Range   Lactic Acid, Venous 0.72 0.5 - 1.9 mmol/L  Sedimentation rate     Status: None   Collection Time: 09/17/16  6:43 AM  Result Value Ref Range   Sed Rate 2 0 - 16 mm/hr  C-reactive protein     Status: Abnormal   Collection Time: 09/17/16  6:43 AM  Result Value Ref Range   CRP 8.6 (H) <1.0 mg/dL    Comment: Performed at [facility] [address], [place], [place] [address]  Basic metabolic panel     Status: Abnormal   Collection Time: 09/17/16  6:43 AM  Result Value Ref Range   Sodium 138 135 - 145 mmol/L   Potassium 4.0 3.5 - 5.1 mmol/L   Chloride 107 101 - 111 mmol/L   CO2 26 22 - 32 mmol/L   Glucose, Bld 106 (H) 65 - 99 mg/dL   BUN 8 6 - 20 mg/dL   Creatinine, Ser 1.01 0.61 - 1.24 mg/dL   Calcium 8.8 (L) 8.9 - 10.3 mg/dL   GFR calc non Af Amer >60 >60 mL/min   GFR calc Af Amer >60 >60 mL/min    Comment: (NOTE) The eGFR has been calculated using the CKD EPI equation. This calculation has not been validated in all clinical situations. eGFR's  persistently <60 mL/min signify possible Chronic Kidney Disease.    Anion gap 5 5 - 15  Protime-INR     Status: None   Collection Time: 09/17/16  6:43 AM  Result Value Ref Range   Prothrombin Time 14.3 11.4 - 15.2 seconds   INR 1.10   CBC     Status: Abnormal   Collection Time: 09/17/16  6:43 AM  Result Value Ref Range   WBC 31.0 (H) 4.0 - 10.5 K/uL   RBC 6.97 (H) 4.22 - 5.81 MIL/uL   Hemoglobin 15.2 13.0 - 17.0 g/dL   HCT 42.6 39.0 - 52.0 %   MCV 61.1 (L) 78.0 - 100.0 fL   MCH 21.8 (L) 26.0 - 34.0 pg   MCHC 35.7 30.0 - 36.0 g/dL   RDW 15.9 (H) 11.5 - 15.5 %   Platelets 215 150 - 400 K/uL  Glucose, capillary     Status: Abnormal   Collection Time: 09/17/16  7:32 AM  Result Value Ref Range   Glucose-Capillary 105 (H) 65 - 99 mg/dL   Ct Pelvis W Contrast  Result Date: 09/16/2016 CLINICAL DATA:  Worsening pain in gluteal fold pain and swelling, onset 1 week ago. EXAM: CT PELVIS WITH CONTRAST TECHNIQUE: Multidetector CT imaging of the pelvis was performed using the standard protocol following the bolus administration of intravenous contrast. CONTRAST:  43m ISOVUE-300 IOPAMIDOL (ISOVUE-300) INJECTION 61% COMPARISON:  08/02/2013 CT FINDINGS: Urinary Tract: Mild ectasia of the distal left ureter without obstructing mass or calculus. The urinary bladder is physiologically distended. Bowel: No acute bowel inflammation or obstruction. Normal-appearing appendix. Vascular/Lymphatic: Mild atherosclerotic vascular calcifications along the visualized common  iliac arteries and branch vessels. No aneurysmal dilatation. No lymphadenopathy Reproductive: Fat noted within the right inguinal canal. Normal size prostate. Other: Similar to prior exam, there is an inflammatory phlegmonous mass within the subcutaneous soft tissues just deep to the natal cleft extending caudally along the left medial buttock and cephalad to the small pullback, overlying the erector spinae muscles. Potential small abscesses to the  right and left of the natal cleft approximately 3 cm from the superior margin of the cleft measuring 5 x 1.1 cm. Musculoskeletal: No acute fracture nor bone destruction. IMPRESSION: Subcutaneous inflammatory phlegmonous masslike abnormality with possible small drainable abscesses deep to the upper margin of the natal cleft measuring 5 x 1.1 cm with edema along the small of the back extending and overlying the visualized erector spinae muscles. Electronically Signed   By: Ashley Royalty M.D.   On: 09/16/2016 23:45   Assessment/Plan Left superior gluteal abscess: Not currently draining on exam and CT scan, while significant for inflammatory changes, not significant for a definitive drainable fluid collection. There is no notable perirectal or perianal abscess on exam. At this time I recommend continued IV antibiotics and initiating sitz baths 3 times a day. General surgery will evaluate the patient tomorrow to determine further surgical needs. Failure to improve might require exam under general anesthesia with incision and drainage in operating room.  Jill Alexanders, [facility] Surgery 09/17/2016, 11:08 AM Pager: [phone] Consults: [phone] Mon-Fri 7:00 am-4:30 pm Sat-Sun 7:00 am-11:30 am

## 2016-09-17 NOTE — Care Management Note (Signed)
Case Management Note  Patient Details  Name: Martin Crawford MRN: [id number] Date of Birth: 06/24/1975  Subjective/Objective:                  41 y.o. male with medical history significant of asthma, chronic back pain, tobacco abuse, pilonidal abscess, who presents with left gluteal fold abscess.  Pt states that he started having pain and swelling in left gluteal cleft week ago, which has been progressively getting worse. The pain is constant, sharp, 10 out of 10 in severity, nonradiating. He developed chills and fever with temperature 102.2 at home. No drainage from the area. Patient does not have nausea, vomiting, diarrhea or abdominal pain, or diarrhea. No chest pain, shortness of breath, cough. No symptoms of UTI or unilateral weakness.  ED Course: pt was found to have WBC 30.9, lactic acid 1.25, 0.72, negative urinalysis, electrolytes renal function okay, temperature 102, tachycardia, oxygen saturation 96% on room air. Pt is admitted to tele bed as inpt.   CT abdomen/pelvis showed: Subcutaneous inflammatory phlegmonous masslike abnormality with possible small drainable abscesses deep to the upper margin of the natal cleft measuring 5 x 1.1 cm with edema along the small of the back extending and overlying the visualized erector spinae muscles.  Action/Plan: Will follow for case management needs  Expected Discharge Date:     [id number]             Expected Discharge Plan:  Home/Self Care  In-House Referral:     Discharge planning Services  CM Consult  Post Acute Care Choice:    Choice offered to:     DME Arranged:    DME Agency:     HH Arranged:    HH Agency:     Status of Service:  In process, will continue to follow  If discussed at Long Length of Stay Meetings, dates discussed:    Additional Comments:  Golda AcreDavis, Rhonda Lynn, RN 09/17/2016, 9:22 AM

## 2016-09-17 NOTE — H&P (Signed)
History and Physical    Martin Crawford LYY:[id number] DOB: February 22, 1976 DOA: 09/16/2016  Referring MD/NP/PA:   PCP: Patient, No Pcp Per   Patient coming from:  The patient is coming from home.  At baseline, pt is independent for most of ADL.  Chief Complaint:  Abscess in bottom  HPI: Martin Crawford is a 41 y.o. male with medical history significant of asthma, chronic back pain, tobacco abuse, pilonidal abscess, who presents with left gluteal fold abscess.  Pt states that he started having pain and swelling in left gluteal cleft week ago, which has been progressively getting worse. The pain is constant, sharp, 10 out of 10 in severity, nonradiating. He developed chills and fever with temperature 102.2 at home. No drainage from the area. Patient does not have nausea, vomiting, diarrhea or abdominal pain, or diarrhea. No chest pain, shortness of breath, cough. No symptoms of UTI or unilateral weakness.  ED Course: pt was found to have WBC 30.9, lactic acid 1.25, 0.72, negative urinalysis, electrolytes renal function okay, temperature 102, tachycardia, oxygen saturation 96% on room air. Pt is admitted to tele bed as inpt.   CT abdomen/pelvis showed: Subcutaneous inflammatory phlegmonous masslike abnormality with possible small drainable abscesses deep to the upper margin of the natal cleft measuring 5 x 1.1 cm with edema along the small of the back extending and overlying the visualized erector spinae muscles.  Review of Systems:   General: has fevers, chills, no changes in body weight, has poor appetite, has fatigue HEENT: no blurry vision, hearing changes or sore throat Respiratory: no dyspnea, coughing, wheezing CV: no chest pain, no palpitations GI: no nausea, vomiting, abdominal pain, diarrhea, constipation GU: no dysuria, burning on urination, increased urinary frequency, hematuria  Ext: no leg edema Neuro: no unilateral weakness, numbness, or tingling, no vision change or hearing  loss Skin: no rash, no skin tear. Has abscess in left left gluteal cleft. MSK: No muscle spasm, no deformity, no limitation of range of movement in spin Heme: No easy bruising.  Travel history: No recent long distant travel.  Allergy: No Known Allergies  Past Medical History:  Diagnosis Date  . Asthma   . Chronic back pain     Past Surgical History:  Procedure Laterality Date  . ABSCESS DRAINAGE     pilonidal abscess    Social History:  reports that he has been smoking Cigarettes.  He has a 28.00 pack-year smoking history. He has never used smokeless tobacco. He reports that he drinks alcohol. He reports that he does not use drugs. Family History:  Family History  Problem Relation Age of Onset  . Stroke Mother   . Hypertension Mother   . Asthma Father   . Hypertension Father      Prior to Admission medications   Medication Sig Start Date End Date Taking? Authorizing Provider  acetaminophen (TYLENOL) 500 MG tablet Take 1,000 mg by mouth every 6 (six) hours as needed for mild pain.   Yes [provider]  cyclobenzaprine (FLEXERIL) 10 MG tablet Take 1 tablet (10 mg total) by mouth 2 (two) times daily as needed for muscle spasms. Patient not taking: Reported on 09/16/2016 06/24/16   Lysbeth Penner, FNP  ibuprofen (ADVIL,MOTRIN) 800 MG tablet Take 1 tablet (800 mg total) by mouth every 8 (eight) hours as needed for mild pain or moderate pain. Patient not taking: Reported on 03/01/2016 01/06/16   Clayton Bibles, PA-C  naproxen (NAPROSYN) 500 MG tablet Take 1 tablet (500 mg  total) by mouth 2 (two) times daily with a meal. Patient not taking: Reported on 09/16/2016 06/24/16   Lysbeth Penner, FNP    Physical Exam: Vitals:   09/16/16 2309 09/17/16 0040 09/17/16 0158 09/17/16 0305  BP: (!) 143/85 121/68 127/76 (!) 151/81  Pulse: (!) 118 (!) 105 92 95  Resp: _0 Temp:   98.2 F (36.8 C) 98.1 F (36.7 C)  TempSrc:   Oral Oral  SpO2: 100% 100% 96% 96%  Weight:        Height:       General: Not in acute distress HEENT:       Eyes: PERRL, EOMI, no scleral icterus.       ENT: No discharge from the ears and nose, no pharynx injection, no tonsillar enlargement.        Neck: No JVD, no bruit, no mass felt. Heme: No neck lymph node enlargement. Cardiac: S1/S2, RRR, No murmurs, No gallops or rubs. Respiratory: No rales, wheezing, rhonchi or rubs. GI: Soft, nondistended, nontender, no rebound pain, no organomegaly, BS present. GU: has induration, erythema and tenderness in left left gluteal cleft Ext: No pitting leg edema bilaterally. 2+DP/PT pulse bilaterally. Musculoskeletal: No joint deformities, No joint redness or warmth, no limitation of ROM in spin. Skin: No rashes.  Neuro: Alert, oriented X3, cranial nerves II-XII grossly intact, moves all extremities normally. Muscle strength 5/5 in all extremities, sensation to light touch intact. Brachial reflex 2+ bilaterally. Knee reflex 1+ bilaterally. Negative Babinski's sign. Normal finger to nose test. Psych: Patient is not psychotic, no suicidal or hemocidal ideation.  Labs on Admission: I have personally reviewed following labs and imaging studies  CBC:  Recent Labs Lab 09/16/16 2200  WBC 30.9*  NEUTROABS 26.7*  HGB 17.3*  HCT 47.9  MCV 61.2*  PLT 063   Basic Metabolic Panel:  Recent Labs Lab 09/16/16 2200  NA 138  K 4.1  CL 104  CO2 24  GLUCOSE 110*  BUN 9  CREATININE 0.99  CALCIUM 9.4   GFR: Estimated Creatinine Clearance: 119.2 mL/min (by C-G formula based on SCr of 0.99 mg/dL). Liver Function Tests:  Recent Labs Lab 09/16/16 2200  AST 23  ALT 27  ALKPHOS 91  BILITOT 0.8  PROT 7.7  ALBUMIN 3.8   No results for input(s): LIPASE, AMYLASE in the last 168 hours. No results for input(s): AMMONIA in the last 168 hours. Coagulation Profile: No results for input(s): INR, PROTIME in the last 168 hours. Cardiac Enzymes: No results for input(s): CKTOTAL, CKMB, CKMBINDEX,  TROPONINI in the last 168 hours. BNP (last 3 results) No results for input(s): PROBNP in the last 8760 hours. HbA1C: No results for input(s): HGBA1C in the last 72 hours. CBG: No results for input(s): GLUCAP in the last 168 hours. Lipid Profile: No results for input(s): CHOL, HDL, LDLCALC, TRIG, CHOLHDL, LDLDIRECT in the last 72 hours. Thyroid Function Tests: No results for input(s): TSH, T4TOTAL, FREET4, T3FREE, THYROIDAB in the last 72 hours. Anemia Panel: No results for input(s): VITAMINB12, FOLATE, FERRITIN, TIBC, IRON, RETICCTPCT in the last 72 hours. Urine analysis:    Component Value Date/Time   COLORURINE YELLOW 09/16/2016 2346   APPEARANCEUR CLEAR 09/16/2016 2346   LABSPEC 1.015 09/16/2016 2346   PHURINE 6.0 09/16/2016 2346   GLUCOSEU NEGATIVE 09/16/2016 2346   HGBUR NEGATIVE 09/16/2016 2346   BILIRUBINUR NEGATIVE 09/16/2016 2346   KETONESUR NEGATIVE 09/16/2016 2346   PROTEINUR NEGATIVE 09/16/2016 2346   UROBILINOGEN 2.0 (H)  08/02/2013 0045   NITRITE NEGATIVE 09/16/2016 2346   LEUKOCYTESUR NEGATIVE 09/16/2016 2346   Sepsis Labs: _0 (procalcitonin:4,lacticidven:4) )No results found for this or any previous visit (from the past 240 hour(s)).   Radiological Exams on Admission: Ct Pelvis W Contrast  Result Date: 09/16/2016 CLINICAL DATA:  Worsening pain in gluteal fold pain and swelling, onset 1 week ago. EXAM: CT PELVIS WITH CONTRAST TECHNIQUE: Multidetector CT imaging of the pelvis was performed using the standard protocol following the bolus administration of intravenous contrast. CONTRAST:  59m ISOVUE-300 IOPAMIDOL (ISOVUE-300) INJECTION 61% COMPARISON:  08/02/2013 CT FINDINGS: Urinary Tract: Mild ectasia of the distal left ureter without obstructing mass or calculus. The urinary bladder is physiologically distended. Bowel: No acute bowel inflammation or obstruction. Normal-appearing appendix. Vascular/Lymphatic: Mild atherosclerotic vascular calcifications along  the visualized common iliac arteries and branch vessels. No aneurysmal dilatation. No lymphadenopathy Reproductive: Fat noted within the right inguinal canal. Normal size prostate. Other: Similar to prior exam, there is an inflammatory phlegmonous mass within the subcutaneous soft tissues just deep to the natal cleft extending caudally along the left medial buttock and cephalad to the small pullback, overlying the erector spinae muscles. Potential small abscesses to the right and left of the natal cleft approximately 3 cm from the superior margin of the cleft measuring 5 x 1.1 cm. Musculoskeletal: No acute fracture nor bone destruction. IMPRESSION: Subcutaneous inflammatory phlegmonous masslike abnormality with possible small drainable abscesses deep to the upper margin of the natal cleft measuring 5 x 1.1 cm with edema along the small of the back extending and overlying the visualized erector spinae muscles. Electronically Signed   By: DAshley RoyaltyM.D.   On: 09/16/2016 23:45     EKG: Independently reviewed.  Sinus rhythm, QTC 454, nonspecific T-wave change.  Assessment/Plan Principal Problem:   Abscess and cellulitis of gluteal region Active Problems:   Chronic back pain   Asthma   [place]   Tobacco abuse   Abscess and cellulitis of left gluteal region and sepsis: Patient meets criteria for sepsis with leukocytosis, fever and tachycardia. Lactic acid is normal. Hemodynamically stable currently. EDP attempted draining without success.  - will admit to tele bed as inpt - Empiric antimicrobial treatment, IV clindamycin was started in ED, will continue. - PRN Zofran for nausea, Percocet for pain - Blood cultures x 2  - ESR and CRP - will get Procalcitonin and trend lactic acid levels per sepsis protocol. - IVF: 3.5 L of NS bolus in ED, followed by 100 cc/h - wound care consult - may need to call surgeon for draining.   Chronic back pain: -on prn percocet and tylenol  Asthma:  stable -prn albuterol nebs.  Tobacco abuse: -Did counseling about importance of quitting smoking -Nicotine patch  DVT ppx: SCD Code Status: Full code Family Communication: Yes, patient's wife at bed side Disposition Plan:  Anticipate discharge back to previous home environment Consults called:  none Admission status: Inpatient/tele    Date of Service 09/17/2016    NIvor CostaTriad Hospitalists Pager [phone] If 7PM-7AM, please contact night-coverage [email or web address] Password [facility]5/30/2018, 6:52 AM

## 2016-09-17 NOTE — Consult Note (Signed)
WOC Nurse wound consult note Reason for Consult: I&D incision for possible abscess Wound type: full thickness Pressure Injury POA: no Measurement: 2.25cm x 0.25cm x 0.25cm Wound bed: beefy red Drainage (amount, consistency, odor) none, no odor noted Periwound: intact, tender Dressing procedure/placement/frequency: I have provided nurses with orders for Sitz baths TID with assistance to make sure water flow at correct angle for wound. We will not follow, but will remain available to this patient, to nursing, and the medical and/or surgical teams.  Please re-consult if we need to assist further.    Barnett HatterMelinda Vardaan Depascale, RN-C, WTA-C Wound Treatment Associate

## 2016-09-18 DIAGNOSIS — Z720 Tobacco use: Secondary | ICD-10-CM

## 2016-09-18 LAB — IRON AND TIBC
Iron: 12 ug/dL — ABNORMAL LOW (ref 45–182)
SATURATION RATIOS: 4 % — AB (ref 17.9–39.5)
TIBC: 315 ug/dL (ref 250–450)
UIBC: 303 ug/dL

## 2016-09-18 LAB — CBC
HCT: 42.2 % (ref 39.0–52.0)
Hemoglobin: 15.2 g/dL (ref 13.0–17.0)
MCH: 22 pg — AB (ref 26.0–34.0)
MCHC: 36 g/dL (ref 30.0–36.0)
MCV: 61.1 fL — AB (ref 78.0–100.0)
PLATELETS: 228 10*3/uL (ref 150–400)
RBC: 6.91 MIL/uL — AB (ref 4.22–5.81)
RDW: 15.5 % (ref 11.5–15.5)
WBC: 31.3 10*3/uL — ABNORMAL HIGH (ref 4.0–10.5)

## 2016-09-18 LAB — RETICULOCYTES
RBC.: 6.79 MIL/uL — AB (ref 4.22–5.81)
Retic Count, Absolute: 54.3 10*3/uL (ref 19.0–186.0)
Retic Ct Pct: 0.8 % (ref 0.4–3.1)

## 2016-09-18 LAB — PATHOLOGIST SMEAR REVIEW

## 2016-09-18 LAB — GLUCOSE, CAPILLARY: Glucose-Capillary: 114 mg/dL — ABNORMAL HIGH (ref 65–99)

## 2016-09-18 LAB — FERRITIN: Ferritin: 128 ng/mL (ref 24–336)

## 2016-09-18 LAB — FOLATE: FOLATE: 12.9 ng/mL (ref >5.9)

## 2016-09-18 LAB — PROCALCITONIN: Procalcitonin: <0.1 ng/mL

## 2016-09-18 LAB — VITAMIN B12: Vitamin B-12: 268 pg/mL (ref 180–914)

## 2016-09-18 MED ORDER — DOXYCYCLINE HYCLATE 100 MG PO TABS: 100.0000 mg | ORAL_TABLET | Freq: Two times a day (BID) | ORAL | Status: DC

## 2016-09-18 MED ORDER — DOXYCYCLINE HYCLATE 100 MG PO CAPS: 100.0000 mg | ORAL_CAPSULE | Freq: Two times a day (BID) | ORAL | 0 refills | Status: DC

## 2016-09-18 MED ORDER — OXYCODONE-ACETAMINOPHEN 5-325 MG PO TABS: 2.0000 | ORAL_TABLET | Freq: Four times a day (QID) | ORAL | 0 refills | Status: DC | PRN

## 2016-09-18 NOTE — Discharge Summary (Signed)
Physician Discharge Summary  Martin Crawford SJG:[id number] DOB: Dec 18, 1975 DOA: 09/16/2016  PCP: Patient, No Pcp Per  Admit date: 09/16/2016 Discharge date: 09/18/2016  Admitted From: Home Disposition: Home  Recommendations for Outpatient Follow-up:  1. Follow up with PCP in 1 week (patient states he will look for a new PCP per his wife's insurance) 2. Follow up with Hematology in 1 week 3. Please follow up on the following pending results: Blood cultures  Home Health: None Equipment/Devices: None  Discharge Condition: Guarded CODE STATUS: Full code Diet recommendation: Regular diet   Brief/Interim Summary:  Admission HPI written by Ivor Costa, MD   Chief Complaint:  Abscess in bottom  HPI: Martin Crawford is a 41 y.o. male with medical history significant of asthma, chronic back pain, tobacco abuse, pilonidal abscess, who presents with left gluteal fold abscess.  Pt states that he started having pain and swelling in left gluteal cleft week ago, which has been progressively getting worse. The pain is constant, sharp, 10 out of 10 in severity, nonradiating. He developed chills and fever with temperature 102.2 at home. No drainage from the area. Patient does not have nausea, vomiting, diarrhea or abdominal pain, or diarrhea. No chest pain, shortness of breath, cough. No symptoms of UTI or unilateral weakness.  ED Course: pt was found to have WBC 30.9, lactic acid 1.25, 0.72, negative urinalysis, electrolytes renal function okay, temperature 102, tachycardia, oxygen saturation 96% on room air. Pt is admitted to tele bed as inpt.   CT abdomen/pelvis showed: Subcutaneous inflammatory phlegmonous masslike abnormality with possible small drainable abscesses deep to the upper margin of the natal cleft measuring 5 x 1.1 cm with edema along the small of the back extending and overlying the visualized erector spinae muscles.    Hospital course:  Abscess and cellulitis of left gluteal  fold Sepsis Patient met sepsis criteria on admission. He was given IV fluids and started on antibiotics. Incision and drainage was attempted without success in the emergency department. Patient started on empiric IV clindamycin. CT scan concerning for possible abscess. General surgery consulted and recommended I&D under general anesthesia, for which the patient refused. Risks dicussed with patient multiple times. Discussed with Dr. Tommy Medal, infectious disease, who recommended doxycycline as an outpatient regimen in the setting of patient's refusal to undergo I&D.  Leukocytosis Likely secondary to abscess and resultant sepsis. Stable. Discussed with oncology who felt likely secondary to infection.  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 Pathology smear significant for erythrocytosis and target cells. Hematology follow-up  Tobacco abuse Patient counseled and provided a nicotine patch while inpatient.   Discharge Diagnoses:  Principal Problem:   Abscess and cellulitis of gluteal region Active Problems:   Chronic back pain   Asthma   Sepsis ([place])   Tobacco abuse    Discharge Instructions  Discharge Instructions    Call MD for:  difficulty breathing, headache or visual disturbances    Complete by:  As directed    Call MD for:  extreme fatigue    Complete by:  As directed    Call MD for:  persistant dizziness or light-headedness    Complete by:  As directed    Call MD for:  persistant nausea and vomiting    Complete by:  As directed    Call MD for:  temperature >100.4    Complete by:  As directed    Diet - low sodium heart healthy    Complete by:  As directed    Increase activity slowly  Complete by:  As directed      Allergies as of 09/18/2016   No Known Allergies     Medication List    STOP taking these medications   acetaminophen 500 MG tablet Commonly known as:  TYLENOL   cyclobenzaprine 10 MG tablet Commonly known as:  FLEXERIL   ibuprofen 800 MG  tablet Commonly known as:  ADVIL,MOTRIN   naproxen 500 MG tablet Commonly known as:  NAPROSYN     TAKE these medications   doxycycline 100 MG capsule Commonly known as:  VIBRAMYCIN Take 1 capsule (100 mg total) by mouth 2 (two) times daily.   oxyCODONE-acetaminophen 5-325 MG tablet Commonly known as:  PERCOCET/ROXICET Take 2 tablets by mouth every 6 (six) hours as needed for severe pain.      Follow-up Information    [facility]. Schedule an appointment as soon as possible for a visit in 1 week(s).          No Known Allergies  Consultations:  General surgery  Infectious disease   Procedures/Studies: Ct Pelvis W Contrast  Result Date: 09/16/2016 CLINICAL DATA:  Worsening pain in gluteal fold pain and swelling, onset 1 week ago. EXAM: CT PELVIS WITH CONTRAST TECHNIQUE: Multidetector CT imaging of the pelvis was performed using the standard protocol following the bolus administration of intravenous contrast. CONTRAST:  20m ISOVUE-300 IOPAMIDOL (ISOVUE-300) INJECTION 61% COMPARISON:  08/02/2013 CT FINDINGS: Urinary Tract: Mild ectasia of the distal left ureter without obstructing mass or calculus. The urinary bladder is physiologically distended. Bowel: No acute bowel inflammation or obstruction. Normal-appearing appendix. Vascular/Lymphatic: Mild atherosclerotic vascular calcifications along the visualized common iliac arteries and branch vessels. No aneurysmal dilatation. No lymphadenopathy Reproductive: Fat noted within the right inguinal canal. Normal size prostate. Other: Similar to prior exam, there is an inflammatory phlegmonous mass within the subcutaneous soft tissues just deep to the natal cleft extending caudally along the left medial buttock and cephalad to the small pullback, overlying the erector spinae muscles. Potential small abscesses to the right and left of the natal cleft approximately 3 cm from the superior margin of the cleft measuring 5 x 1.1 cm.  Musculoskeletal: No acute fracture nor bone destruction. IMPRESSION: Subcutaneous inflammatory phlegmonous masslike abnormality with possible small drainable abscesses deep to the upper margin of the natal cleft measuring 5 x 1.1 cm with edema along the small of the back extending and overlying the visualized erector spinae muscles. Electronically Signed   By: DAshley RoyaltyM.D.   On: 09/16/2016 23:45      Subjective: Patient reports improvement in pain. Afebrile.   Discharge Exam: Vitals:   09/18/16 0510 09/18/16 1400  BP: 136/85 (!) 138/92  Pulse: 97 99  Resp: 16 14  Temp: 99.2 F (37.3 C) 98.7 F (37.1 C)   Vitals:   09/17/16 1427 09/17/16 2111 09/18/16 0510 09/18/16 1400  BP: 127/73 133/88 136/85 (!) 138/92  Pulse: 87 92 97 99  Resp: 20 20 16 14   Temp: 98.4 F (36.9 C) 98.9 F (37.2 C) 99.2 F (37.3 C) 98.7 F (37.1 C)  TempSrc: Oral Oral Oral Oral  SpO2: 95% 91% 96%   Weight:      Height:        General: Pt is alert, awake, not in acute distress Cardiovascular: RRR, S1/S2 +, no rubs, no gallops Respiratory: CTA bilaterally, no wheezing, no rhonchi Abdominal: Soft, NT, ND, bowel sounds + Extremities: no edema, no cyanosis    The results of significant diagnostics from this hospitalization (  including imaging, microbiology, ancillary and laboratory) are listed below for reference.     Microbiology: Recent Results (from the past 240 hour(s))  Blood Culture (routine x 2)     Status: None (Preliminary result)   Collection Time: 09/16/16 10:00 PM  Result Value Ref Range Status   Specimen Description BLOOD LEFT FOREARM  Final   Special Requests   Final    BOTTLES DRAWN AEROBIC AND ANAEROBIC Blood Culture adequate volume   Culture   Final    NO GROWTH 1 DAY Performed at [facility], [address], [place], [place] [address]    Report Status PENDING  Incomplete  Blood Culture (routine x 2)     Status: None (Preliminary result)   Collection Time: 09/16/16  10:00 PM  Result Value Ref Range Status   Specimen Description BLOOD LEFT ANTECUBITAL  Final   Special Requests   Final    BOTTLES DRAWN AEROBIC AND ANAEROBIC Blood Culture adequate volume   Culture   Final    NO GROWTH 1 DAY Performed at [facility], [place] [address], [place], [place] [address]    Report Status PENDING  Incomplete     Labs: Basic Metabolic Panel:  Recent Labs Lab 09/16/16 2200 09/17/16 0643  NA 138 138  K 4.1 4.0  CL 104 107  CO2 24 26  GLUCOSE 110* 106*  BUN 9 8  CREATININE 0.99 1.01  CALCIUM 9.4 8.8*   Liver Function Tests:  Recent Labs Lab 09/16/16 2200  AST 23  ALT 27  ALKPHOS 91  BILITOT 0.8  PROT 7.7  ALBUMIN 3.8   CBC:  Recent Labs Lab 09/16/16 2200 09/17/16 0643 09/18/16 0631  WBC 30.9* 31.0* 31.3*  NEUTROABS 26.7*  --   --   HGB 17.3* 15.2 15.2  HCT 47.9 42.6 42.2  MCV 61.2* 61.1* 61.1*  PLT 251 215 228   CBG:  Recent Labs Lab 09/17/16 0732 09/18/16 0721  GLUCAP 105* 114*   Anemia work up  Recent Labs  09/18/16 1429  RETICCTPCT 0.8   Urinalysis    Component Value Date/Time   COLORURINE YELLOW 09/16/2016 2346   APPEARANCEUR CLEAR 09/16/2016 2346   LABSPEC 1.015 09/16/2016 2346   PHURINE 6.0 09/16/2016 [place] 09/16/2016 2346   HGBUR NEGATIVE 09/16/2016 2346   BILIRUBINUR NEGATIVE 09/16/2016 2346   KETONESUR NEGATIVE 09/16/2016 2346   PROTEINUR NEGATIVE 09/16/2016 2346   UROBILINOGEN 2.0 (H) 08/02/2013 0045   NITRITE NEGATIVE 09/16/2016 2346   LEUKOCYTESUR NEGATIVE 09/16/2016 2346   Microbiology Recent Results (from the past 240 hour(s))  Blood Culture (routine x 2)     Status: None (Preliminary result)   Collection Time: 09/16/16 10:00 PM  Result Value Ref Range Status   Specimen Description BLOOD LEFT FOREARM  Final   Special Requests   Final    BOTTLES DRAWN AEROBIC AND ANAEROBIC Blood Culture adequate volume   Culture   Final    NO GROWTH 1 DAY Performed at [facility], [place] [address] [place], [place] [address]    Report Status PENDING  Incomplete  Blood Culture (routine x 2)     Status: None (Preliminary result)   Collection Time: 09/16/16 10:00 PM  Result Value Ref Range Status   Specimen Description BLOOD LEFT ANTECUBITAL  Final   Special Requests   Final    BOTTLES DRAWN AEROBIC AND ANAEROBIC Blood Culture adequate volume   Culture   Final  NO GROWTH 1 DAY Performed at [facility], [place] [address] [place], [place] [address]    Report Status PENDING  Incomplete     Time coordinating discharge: Over 30 minutes  SIGNED:   Cordelia Poche, MD [facility] 09/18/2016, 4:42 PM Pager [phone]  If 7PM-7AM, please contact night-coverage [email or web address] Password [email or web address]

## 2016-09-18 NOTE — Discharge Instructions (Signed)
Martin Crawford,  You were admitted because of a concern for an abscess. This is being treated with antibiotics. This may or may not cure the possible infection. Your inflammatory cells were high. You will need to follow-up with a hematologist to have this worked up.

## 2016-09-18 NOTE — Progress Notes (Signed)
[facility] Progress Note     Subjective: CC: gluteal pain Pain rated 8/10, not improved compared to yesterday. Denies fever, chills, nausea, vomiting. Denies purulent drainage.  Patient again denies a PMH of HIV, hepatitis C, blood-born diseases, leukemia, or diabetes.   WBC remains 31.3 today.  Objective: Vital signs in last 24 hours: Temp:  [98.4 F (36.9 C)-99.2 F (37.3 C)] 99.2 F (37.3 C) (05/31 0510) Pulse Rate:  [87-97] 97 (05/31 0510) Resp:  [16-20] 16 (05/31 0510) BP: (127-136)/(73-88) 136/85 (05/31 0510) SpO2:  [91 %-96 %] 96 % (05/31 0510) Last BM Date: 09/16/16  Intake/Output from previous day: 05/30 0701 - 05/31 0700 In: 3290 [P.O.:900; I.V.:2340; IV Piggyback:50] Out: -  Intake/Output this shift: No intake/output data recorded.  PE: Gen:  Alert, NAD, pleasant HEENT: pupils equal, EOM's in tact Card:  Regular rate and rhythm, pedal pulses 2+ BL Pulm:  Normal effort, clear to auscultation bilaterally Abd: Soft, non-tender, non-distended Left buttock: small 2 cm incision c/d/i, pt still with significant tenderness and induration of left superior buttock with some involvement of right superior buttock. No palpable fluctuance.  Skin: warm and dry, no rashes  Psych: A&Ox3   Lab Results:   Recent Labs  09/17/16 0643 09/18/16 0631  WBC 31.0* 31.3*  HGB 15.2 15.2  HCT 42.6 42.2  PLT 215 228   BMET  Recent Labs  09/16/16 2200 09/17/16 0643  NA 138 138  K 4.1 4.0  CL 104 107  CO2 24 26  GLUCOSE 110* 106*  BUN 9 8  CREATININE 0.99 1.01  CALCIUM 9.4 8.8*   PT/INR  Recent Labs  09/17/16 0643  LABPROT 14.3  INR 1.10   CMP     Component Value Date/Time   NA 138 09/17/2016 0643   K 4.0 09/17/2016 0643   CL 107 09/17/2016 0643   CO2 26 09/17/2016 0643   GLUCOSE 106 (H) 09/17/2016 0643   BUN 8 09/17/2016 0643   CREATININE 1.01 09/17/2016 0643   CALCIUM 8.8 (L) 09/17/2016 0643   PROT 7.7 09/16/2016 2200   ALBUMIN 3.8  09/16/2016 2200   AST 23 09/16/2016 2200   ALT 27 09/16/2016 2200   ALKPHOS 91 09/16/2016 2200   BILITOT 0.8 09/16/2016 2200   GFRNONAA >60 09/17/2016 0643   GFRAA >60 09/17/2016 0643   Lipase  No results found for: LIPASE     Studies/Results: Ct Pelvis W Contrast  Result Date: 09/16/2016 CLINICAL DATA:  Worsening pain in gluteal fold pain and swelling, onset 1 week ago. EXAM: CT PELVIS WITH CONTRAST TECHNIQUE: Multidetector CT imaging of the pelvis was performed using the standard protocol following the bolus administration of intravenous contrast. CONTRAST:  75mL ISOVUE-300 IOPAMIDOL (ISOVUE-300) INJECTION 61% COMPARISON:  08/02/2013 CT FINDINGS: Urinary Tract: Mild ectasia of the distal left ureter without obstructing mass or calculus. The urinary bladder is physiologically distended. Bowel: No acute bowel inflammation or obstruction. Normal-appearing appendix. Vascular/Lymphatic: Mild atherosclerotic vascular calcifications along the visualized common iliac arteries and branch vessels. No aneurysmal dilatation. No lymphadenopathy Reproductive: Fat noted within the right inguinal canal. Normal size prostate. Other: Similar to prior exam, there is an inflammatory phlegmonous mass within the subcutaneous soft tissues just deep to the natal cleft extending caudally along the left medial buttock and cephalad to the small pullback, overlying the erector spinae muscles. Potential small abscesses to the right and left of the natal cleft approximately 3 cm from the superior margin of the cleft measuring 5 x 1.1 cm.  Musculoskeletal: No acute fracture nor bone destruction. IMPRESSION: Subcutaneous inflammatory phlegmonous masslike abnormality with possible small drainable abscesses deep to the upper margin of the natal cleft measuring 5 x 1.1 cm with edema along the small of the back extending and overlying the visualized erector spinae muscles. Electronically Signed   By: Tollie Eth M.D.   On: 09/16/2016  23:45    Anti-infectives: Anti-infectives    Start     Dose/Rate Route Frequency Ordered Stop   09/17/16 0600  clindamycin (CLEOCIN) IVPB 600 mg     600 mg 100 mL/hr over 30 Minutes Intravenous Every 8 hours 09/17/16 0330     09/16/16 2145  clindamycin (CLEOCIN) IVPB 600 mg     600 mg 100 mL/hr over 30 Minutes Intravenous  Once 09/16/16 2136 09/16/16 2252       Assessment/Plan Left superior gluteal abscess with cellulitis vs pilonidal cyst - recurrent problem. Leukocytosis and pain not improving. I am not convinced there is a drainable fluid collection, however given patients lack of clinical improvement and persistent leukocytosis, I think exam under general anesthesia with further incision and drainage or debridement is reasonable. I have discussed this with the surgeon and the patient. Patient wishes to discuss with his wife before agreeing to surgery. Will keep NPO for now. - continue IV abx - continue sitz baths  FEN: NPO ID: [email or web address],  VTE: SCD's   LOS: 1 day    Adam Phenix , [facility] 09/18/2016, 7:44 AM Pager: [phone] Consults: [phone] Mon-Fri 7:00 am-4:30 pm Sat-Sun 7:00 am-11:30 am

## 2016-09-22 ENCOUNTER — Inpatient Hospital Stay (HOSPITAL_COMMUNITY)
Admission: EM | Admit: 2016-09-22 | Discharge: 2016-09-25 | DRG: 571 | Disposition: A | Discharge status: Home Health | Payer: Self-pay | Attending: General Surgery | Admitting: General Surgery

## 2016-09-22 ENCOUNTER — Emergency Department (HOSPITAL_COMMUNITY): Payer: Self-pay

## 2016-09-22 DIAGNOSIS — L0501 Pilonidal cyst with abscess: Secondary | ICD-10-CM

## 2016-09-22 DIAGNOSIS — L03317 Cellulitis of buttock: Secondary | ICD-10-CM | POA: Diagnosis present

## 2016-09-22 DIAGNOSIS — Z8249 Family history of ischemic heart disease and other diseases of the circulatory system: Secondary | ICD-10-CM

## 2016-09-22 DIAGNOSIS — Z823 Family history of stroke: Secondary | ICD-10-CM

## 2016-09-22 DIAGNOSIS — F17210 Nicotine dependence, cigarettes, uncomplicated: Secondary | ICD-10-CM | POA: Diagnosis present

## 2016-09-22 DIAGNOSIS — J45909 Unspecified asthma, uncomplicated: Secondary | ICD-10-CM | POA: Diagnosis present

## 2016-09-22 HISTORY — DX: Pilonidal cyst with abscess: L05.01

## 2016-09-22 LAB — I-STAT CG4 LACTIC ACID, ED: LACTIC ACID, VENOUS: 0.83 mmol/L (ref 0.5–1.9)

## 2016-09-22 LAB — CBC WITH DIFFERENTIAL/PLATELET
BASOS ABS: 0 10*3/uL (ref 0.0–0.1)
Basophils Relative: 0 %
Eosinophils Absolute: 0.3 10*3/uL (ref 0.0–0.7)
Eosinophils Relative: 1 %
HCT: 44.9 % (ref 39.0–52.0)
HEMOGLOBIN: 16.5 g/dL (ref 13.0–17.0)
Lymphocytes Relative: 12 %
Lymphs Abs: 3 10*3/uL (ref 0.7–4.0)
MCH: 22.3 pg — AB (ref 26.0–34.0)
MCHC: 36.7 g/dL — ABNORMAL HIGH (ref 30.0–36.0)
MCV: 60.7 fL — ABNORMAL LOW (ref 78.0–100.0)
MONO ABS: 2.3 10*3/uL — AB (ref 0.1–1.0)
MONOS PCT: 9 %
NEUTROS ABS: 19.5 10*3/uL — AB (ref 1.7–7.7)
Neutrophils Relative %: 78 %
Platelets: 326 10*3/uL (ref 150–400)
RBC: 7.4 MIL/uL — ABNORMAL HIGH (ref 4.22–5.81)
RDW: 15.1 % (ref 11.5–15.5)
WBC: 25.1 10*3/uL — AB (ref 4.0–10.5)

## 2016-09-22 LAB — BASIC METABOLIC PANEL
Anion gap: 9 (ref 5–15)
BUN: 10 mg/dL (ref 6–20)
CALCIUM: 10 mg/dL (ref 8.9–10.3)
CHLORIDE: 103 mmol/L (ref 101–111)
CO2: 26 mmol/L (ref 22–32)
CREATININE: 0.98 mg/dL (ref 0.61–1.24)
GFR calc Af Amer: >60 mL/min (ref >60)
GFR calc non Af Amer: >60 mL/min (ref >60)
Glucose, Bld: 112 mg/dL — ABNORMAL HIGH (ref 65–99)
Potassium: 3.9 mmol/L (ref 3.5–5.1)
SODIUM: 138 mmol/L (ref 135–145)

## 2016-09-22 LAB — CULTURE, BLOOD (ROUTINE X 2)
CULTURE: NO GROWTH
CULTURE: NO GROWTH
Special Requests: ADEQUATE
Special Requests: ADEQUATE

## 2016-09-22 MED ORDER — MORPHINE SULFATE (PF) 2 MG/ML IV SOLN
4.0000 mg | Freq: Once | INTRAVENOUS | Status: AC
  Administered 2016-09-22: 4 mg via INTRAVENOUS

## 2016-09-22 MED ORDER — MORPHINE SULFATE (PF) 2 MG/ML IV SOLN
4.0000 mg | Freq: Once | INTRAVENOUS | Status: AC
  Administered 2016-09-22: 4 mg via INTRAVENOUS
  Filled 2016-09-22: qty 2

## 2016-09-22 MED ORDER — IOPAMIDOL (ISOVUE-300) INJECTION 61%
INTRAVENOUS | Status: AC
  Filled 2016-09-22: qty 100

## 2016-09-22 MED ORDER — IOPAMIDOL (ISOVUE-300) INJECTION 61%
100.0000 mL | Freq: Once | INTRAVENOUS | Status: AC | PRN
  Administered 2016-09-22: 100 mL via INTRAVENOUS

## 2016-09-22 MED ORDER — MORPHINE SULFATE (PF) 2 MG/ML IV SOLN
INTRAVENOUS | Status: AC
  Administered 2016-09-22: 4 mg via INTRAVENOUS
  Filled 2016-09-22: qty 2

## 2016-09-22 NOTE — ED Provider Notes (Signed)
[facility]-EMERGENCY DEPT Provider Note   CSN: [id number] Arrival date & time: 09/22/16  1734  By signing my name below, I, Cynda Acres, attest that this documentation has been prepared under the direction and in the presence of  [address] [employer]. Electronically Signed: Cynda Acres, Scribe. 09/22/16. 6:35 PM.  History   Chief Complaint Chief Complaint  Patient presents with  . Abscess   HPI Comments: Martin Crawford is a 41 y.o. male with a PMHx of asthma and chronic back pain, who presents to the Emergency Department complaining of continued pain, swelling, warmth, and erythema to the pilonidal abscess that he was admitted for on 09/16/16, and today he spontaneously began draining. Patient states he developed an abscess and was admitted from 09/16/16-09/18/16, due to sepsis from this abscess. Patient was advised to return if the abscess began to drain. Per chart review, pt came to the ED 09/16/16, was febrile and tachycardic, sepsis orders initiated and CT pelvis was obtained which showed large phlegmatous area at gluteal cleft which extended deeper and had some ?drainable abscesses, all the way to the erector spinae musculature; labs revealed significant leukocytosis with left shift; I&D in the ED was done but he was admitted for surgical consult and IV abx due to concern for sepsis from this; pt was treated with IV clindamycin and seen by surgery on the 30th, at which time he was told that the abscess could potentially become drainable, but at that time it was not, so they wanted to continue IV abx only; Patient was seen once again on the 31st by surgery, who recommended having surgery for wound exploration under anesthesia and possible debridement, but the patient refused multiple times. He was then discharged home with doxycycline and oxycodone and told to f/up with hematology and a PCP; of note, his leukocytosis continued to rise during his hospitalization and had not come down by the time he was  discharged. Patient states his pain has gradually worsened over the last few days, and reports no improvement in his swelling, erythema, or warmth to the area. Patient describes the pain as 10/10 constant throbbing non-radiating gluteal cleft/sacral pain; aggravated by sitting or laying on his back; mildly relieved with percocet that he was prescribed. Patient has been compliant with antibiotics. Patient denies any ongoing fevers, chills, red streaking from the area, CP, SOB, abd pain, N/V/D/C, hematuria, dysuria, rectal pain, melena, hematochezia, arthralgias, numbness, tingling, focal weakness, or any other complaints at this time.   The history is provided by the patient and medical records. No language interpreter was used.  Abscess  Location:  Pelvis (buttocks) Pelvic abscess location:  Gluteal cleft Abscess quality: draining, painful, redness and warmth   Red streaking: no   Duration:  5 days Progression:  Worsening Pain details:    Quality:  Throbbing   Severity:  Severe   Duration:  5 days   Timing:  Constant   Progression:  Worsening Chronicity:  New Context: not diabetes and not immunosuppression   Relieved by:  Warm water soaks (Narcotics) Exacerbated by: sitting/laying on the area. Ineffective treatments:  None tried Associated symptoms: no fever, no nausea and no vomiting   Risk factors: prior abscess     Past Medical History:  Diagnosis Date  . Asthma   . Chronic back pain     Patient Active Problem List   Diagnosis Date Noted  . Abscess and cellulitis of gluteal region 09/17/2016  . Sepsis (HCC) 09/17/2016  . Tobacco abuse 09/17/2016  .  Chronic back pain   . Asthma     Past Surgical History:  Procedure Laterality Date  . ABSCESS DRAINAGE     pilonidal abscess       Home Medications    Prior to Admission medications   Medication Sig Start Date End Date Taking? Authorizing Provider  doxycycline (VIBRAMYCIN) 100 MG capsule Take 1 capsule (100 mg  total) by mouth 2 (two) times daily. 09/18/16 10/18/16  Narda Bonds, MD  oxyCODONE-acetaminophen (PERCOCET/ROXICET) 5-325 MG tablet Take 2 tablets by mouth every 6 (six) hours as needed for severe pain. 09/18/16   Narda Bonds, MD    Family History Family History  Problem Relation Age of Onset  . Stroke Mother   . Hypertension Mother   . Asthma Father   . Hypertension Father     Social History Social History  Substance Use Topics  . Smoking status: Current Every Day Smoker    Packs/day: 1.00    Years: 28.00    Types: Cigarettes  . Smokeless tobacco: Never Used  . Alcohol use Yes     Allergies   Patient has no known allergies.   Review of Systems Review of Systems  Constitutional: Negative for chills and fever.  Respiratory: Negative for shortness of breath.   Cardiovascular: Negative for chest pain.  Gastrointestinal: Negative for abdominal pain, blood in stool, constipation, diarrhea, nausea and vomiting.  Genitourinary: Negative for dysuria and hematuria.  Musculoskeletal: Negative for arthralgias and myalgias.  Skin: Positive for color change and wound.       +Pilonidal abscess/cellulitis  Allergic/Immunologic: Negative for immunocompromised state.  Neurological: Negative for weakness and numbness.  Psychiatric/Behavioral: Negative for confusion.    10 Systems reviewed and all are negative for acute change except as noted in the HPI.  Physical Exam Updated Vital Signs BP (!) 140/99 (BP Location: Left Arm)   Pulse 97   Temp 98.6 F (37 C) (Oral)   Resp (!) 22   SpO2 97%   Physical Exam  Constitutional: He is oriented to person, place, and time. Vital signs are normal. He appears well-developed and well-nourished.  Non-toxic appearance. No distress.  Afebrile, nontoxic, NAD  HENT:  Head: Normocephalic and atraumatic.  Mouth/Throat: Mucous membranes are normal.  Eyes: Conjunctivae and EOM are normal. Right eye exhibits no discharge. Left eye exhibits  no discharge.  Neck: Normal range of motion. Neck supple.  Cardiovascular: Normal rate and intact distal pulses.   Pulmonary/Chest: Effort normal. No respiratory distress.  Abdominal: Normal appearance. He exhibits no distension.  Genitourinary: Rectal exam shows no fissure and no tenderness.  Genitourinary Comments: Chaperone present throughout entire exam.  Lower sacral area at the top of the gluteal fold has mild diffuse erythema, warmth, slight fluctuance/swelling, and is exquisitely tender, extending to the gluteal cleft and into the L buttock, where there is more induration and over the indurated area there is a small ~2 cm opening of the skin, but no expressible drainage although exam limited due to pain.  Scabbed linear incision to the left superior gluteal area at the top of the buttock is closed and without any drainage. Fluctuance/abscessed area does not seem to extend to the rectum, no fissures or tenderness of the anal opening.   Musculoskeletal: Normal range of motion.  Neurological: He is alert and oriented to person, place, and time. He has normal strength. No sensory deficit.  Skin: Skin is warm, dry and intact. No rash noted. There is erythema.  Pilonidal abscess/cellulitis as mentioned above  Psychiatric: He has a normal mood and affect.  Nursing note and vitals reviewed.    ED Treatments / Results  DIAGNOSTIC STUDIES: Oxygen Saturation is 97% on RA, normal by my interpretation.    COORDINATION OF CARE: 6:33 PM Discussed treatment plan with pt at bedside and pt agreed to plan, which includes a consult to surgery.   Labs (all labs ordered are listed, but only abnormal results are displayed) Labs Reviewed  CBC WITH DIFFERENTIAL/PLATELET  BASIC METABOLIC PANEL  I-STAT CG4 LACTIC ACID, ED    EKG  EKG Interpretation None       Radiology No results found.   CT pelvis 09/16/16: IMPRESSION: Subcutaneous inflammatory phlegmonous masslike abnormality  with possible small drainable abscesses deep to the upper margin of the natal cleft measuring 5 x 1.1 cm with edema along the small of the back extending and overlying the visualized erector spinae muscles.  Electronically Signed   By: Tollie Eth M.D.   On: 09/16/2016 23:45  Procedures Procedures (including critical care time)  Medications Ordered in ED Medications  morphine 2 MG/ML injection 4 mg (not administered)     Initial Impression / Assessment and Plan / ED Course  I have reviewed the triage vital signs and the nursing notes.  Pertinent labs & imaging results that were available during my care of the patient were reviewed by me and considered in my medical decision making (see chart for details).      41 y.o. male here with pilonidal abscess that continues to be swollen, tender, red, and warm to touch but has now started draining spontaneously today. He was Seen on 09/16/16 for this abscess initially, noted to have fever and was tachycardic, thus sepsis work up was initiated and labs showed impressive leukocytosis (in the 30s) with left shift, underwent CT pelvis which showed large pilonidal area phlegmonous area with possible deeper drainable abscesses, that extending to the erector spinae musculature; I&D was attempted in the ED that day, but he was admitted for sepsis from pilonidal abscess/cellulitis. Initially the surgeons didn't feel he had a drainable area, so they watched it overnight and just did IV abx; the next day (09/18/16) they felt that possibly taking him to the OR for exploration and debridement would be beneficial, especially given his rising white count, but pt declined multiple times (in hindsight, he thinks he misunderstood what they were telling him about the possibility of it returning). He was sent home with doxycycline. States that the area has continued to be as swollen, painful, red, and warm as it was, denies improvement, and today it opened up.  On  exam, diffuse fluctuance and swelling to lower sacrum above the gluteal cleft, and extending into the L buttock, warm and erythematous, and the opening doesn't really express any drainage although exam limited due to pain. He is afebrile here. Will discuss with surgery, given that that was the recommendation initially, to discuss whether we should still proceed with surgical route, or what the next step would be. Will hold off on labs/etc until I speak with surgery.    6:42 PM Surgery returning page, discussed case with Dr. Donell Beers who agrees that re-imaging his sacral area would be beneficial in deciding whether there is just remaining cellulitis, vs a drainable area that would be helped by surgical intervention. Also agreed with getting labs. Will give pain meds and obtain labs/CT, then reassess shortly.   7:37 PM Work up still pending. Patient care  to be resumed by Frederick Peersachel Little MD at shift change sign-out. Patient history has been discussed with provider resuming care. Briefly, if CT shows drainable area, then call Dr. Donell BeersByerly; if not then review labs and see if he'd be able to continue outpatient course, or admit for ?failure of outpatient abx considering his symptoms haven't really improved. Please see their notes for further documentation of pending results and dispo/care. Pt stable at sign-out and updated on transfer of care.   I personally performed the services described in this documentation, which was scribed in my presence. The recorded information has been reviewed and is accurate.   Final Clinical Impressions(s) / ED Diagnoses   Final diagnoses:  Pilonidal abscess  Cellulitis of buttock    New Prescriptions New Prescriptions   No medications on [address]file     Nicosha Struve, [place], [place]-C 09/22/16 1939    Little, Ambrose Finlandachel Morgan, MD 09/25/16 [phone]

## 2016-09-22 NOTE — ED Notes (Signed)
Bed: WA15 Expected date:  Expected time:  Means of arrival:  Comments: Hold for triage 7 

## 2016-09-22 NOTE — ED Triage Notes (Signed)
Pt complaining that abcsess in buttocks has popped and draining.

## 2016-09-22 NOTE — H&P (Signed)
Martin Crawford is an 41 y.o. male.   Chief Complaint: pilonidal abscess HPI:  Pt is a 41 yo M with a history of recurrent pilonidal abscess who has had 3 weeks of gluteal cleft pain.  This got progressively more swollen and painful.  He came to ED last week and had an attempted I&D.  No pus was obtained despite having a WBC of 31k.  He was febrile and tachycardic as well.  Sepsis workup was initiated.  A CT showed a phlegmon.  He was admitted and placed on IV antibiotics.  He was offered operative debridement, but it sounds as though it was not strongly encouraged given the lack of purulent material obtained and the lack of liquid seen on scan.   He returns today because of continued severe pain and a small amount of spontaneous drainage.  This has occurred around 3 times in the past, but his pain and swelling has never been this bad.  Also, his significant other states that the last 3 times it occurred, he had quite a lot of drainage and immediately felt much better.  He is hurting worse at this point, and he is now having severe lower back pain as well.    Past Medical History:  Diagnosis Date  . Asthma   . Chronic back pain     Past Surgical History:  Procedure Laterality Date  . ABSCESS DRAINAGE     pilonidal abscess    Family History  Problem Relation Age of Onset  . Stroke Mother   . Hypertension Mother   . Asthma Father   . Hypertension Father    Social History:  reports that he has been smoking Cigarettes.  He has a 28.00 pack-year smoking history. He has never used smokeless tobacco. He reports that he drinks alcohol. He reports that he does not use drugs.  Allergies: No Known Allergies  Meds:  Doxycycline and percocet since last week.    Results for orders placed or performed during the hospital encounter of 09/22/16 (from the past 48 hour(s))  CBC with Differential     Status: Abnormal   Collection Time: 09/22/16  7:50 PM  Result Value Ref Range   WBC 25.1 (H) 4.0 -  10.5 K/uL   RBC 7.40 (H) 4.22 - 5.81 MIL/uL   Hemoglobin 16.5 13.0 - 17.0 g/dL   HCT 44.9 39.0 - 52.0 %   MCV 60.7 (L) 78.0 - 100.0 fL   MCH 22.3 (L) 26.0 - 34.0 pg   MCHC 36.7 (H) 30.0 - 36.0 g/dL   RDW 15.1 11.5 - 15.5 %   Platelets 326 150 - 400 K/uL   Neutrophils Relative % 78 %   Lymphocytes Relative 12 %   Monocytes Relative 9 %   Eosinophils Relative 1 %   Basophils Relative 0 %   Neutro Abs 19.5 (H) 1.7 - 7.7 K/uL   Lymphs Abs 3.0 0.7 - 4.0 K/uL   Monocytes Absolute 2.3 (H) 0.1 - 1.0 K/uL   Eosinophils Absolute 0.3 0.0 - 0.7 K/uL   Basophils Absolute 0.0 0.0 - 0.1 K/uL   WBC Morphology WHITE COUNT CONFIRMED ON SMEAR    Smear Review PLATELET COUNT CONFIRMED BY SMEAR   Basic metabolic panel     Status: Abnormal   Collection Time: 09/22/16  7:50 PM  Result Value Ref Range   Sodium 138 135 - 145 mmol/L   Potassium 3.9 3.5 - 5.1 mmol/L   Chloride 103 101 - 111 mmol/L  CO2 26 22 - 32 mmol/L   Glucose, Bld 112 (H) 65 - 99 mg/dL   BUN 10 6 - 20 mg/dL   Creatinine, Ser 0.98 0.61 - 1.24 mg/dL   Calcium 10.0 8.9 - 10.3 mg/dL   GFR calc non Af Amer >60 >60 mL/min   GFR calc Af Amer >60 >60 mL/min    Comment: (NOTE) The eGFR has been calculated using the CKD EPI equation. This calculation has not been validated in all clinical situations. eGFR's persistently <60 mL/min signify possible Chronic Kidney Disease.    Anion gap 9 5 - 15  I-Stat CG4 Lactic Acid, ED     Status: None   Collection Time: 09/22/16  7:56 PM  Result Value Ref Range   Lactic Acid, Venous 0.83 0.5 - 1.9 mmol/L   Ct Pelvis W Contrast  Result Date: 09/22/2016 CLINICAL DATA:  Worsening pilonidal abscess. EXAM: CT PELVIS WITH CONTRAST TECHNIQUE: Multidetector CT imaging of the pelvis was performed using the standard protocol following the bolus administration of intravenous contrast. CONTRAST:  150m ISOVUE-300 IOPAMIDOL (ISOVUE-300) INJECTION 61% COMPARISON:  09/16/2016 FINDINGS: Urinary Tract:  No  abnormality visualized. Bowel:  Unremarkable visualized pelvic bowel loops. Vascular/Lymphatic: No pathologically enlarged lymph nodes. No significant vascular abnormality seen. Reproductive:  No mass or other significant abnormality Other: There has been interval increase of the size of the known pilonidal abscess which now measures 5.4 by 4.1 by 10 cm. The abscess is centered in the subcutaneous tissues with a preserved fat plane to the muscular fascia. It however abuts the inferior-most portion of the posterior coccyx. No evidence of lytic bony changes. Musculoskeletal: No suspicious bone lesions identified. IMPRESSION: Increase of the size of known pilonidal abscess which now measures up to 10 cm in craniocaudal dimension. No evidence of involvement of the underlying musculature, however no fat plane is identified between the anterior portion of the abscess and the tip of the coccyx. Attention on future follow-up is recommended. Electronically Signed   By: DFidela SalisburyM.D.   On: 09/22/2016 21:13    Review of Systems  Constitutional: Positive for chills and fever.  HENT: Negative.   Eyes: Negative.   Respiratory: Negative.   Cardiovascular: Negative.   Gastrointestinal: Negative.   Genitourinary: Negative.   Musculoskeletal: Positive for back pain.  Skin:       pilonidal  Neurological: Negative.   Endo/Heme/Allergies: Negative.   Psychiatric/Behavioral: Negative.     Blood pressure (!) 134/91, pulse 97, temperature 98.7 F (37.1 C), temperature source Oral, resp. rate 18, SpO2 95 %. Physical Exam  Constitutional: He is oriented to person, place, and time. He appears well-developed and well-nourished. He appears distressed (looks uncomfortable).  HENT:  Head: Normocephalic and atraumatic.  Right Ear: External ear normal.  Nose: Nose normal.  Mouth/Throat: Oropharynx is clear and moist.  Eyes: Conjunctivae are normal. Pupils are equal, round, and reactive to light. Right eye  exhibits no discharge. Left eye exhibits no discharge. No scleral icterus.  Neck: Neck supple. No thyromegaly present.  Cardiovascular: Normal rate, regular rhythm and intact distal pulses.   Respiratory: Effort normal. No respiratory distress.  GI: Soft. He exhibits no distension. There is no tenderness.  Musculoskeletal: Normal range of motion. He exhibits no edema, tenderness or deformity.  Neurological: He is alert and oriented to person, place, and time. Coordination normal.  Skin: Skin is warm and dry. No rash noted. He is not diaphoretic. There is erythema. No pallor.     Swelling, fluctuance  around 10 cm vertically.  I&D site closed.  Opening caudal on left of gluteal cleft.  Minimal active drainage.    Psychiatric: He has a normal mood and affect. His behavior is normal. Judgment and thought content normal.     Assessment/Plan Pilonidal abscess with cellulitis NPO after 1 am IV fluids Iv antibiotics To OR with Dr. Excell Seltzer for I&D pilonidal abscess.  Discussed that he may or may not have packing material in place.  He may also require excision at a later date. His abscess is quite large.  Despite having some spontaneous drainage, he has had minimal decompression of this area.     Stark Klein, MD 09/22/2016, 11:53 PM

## 2016-09-22 NOTE — ED Notes (Signed)
Pt to CT

## 2016-09-23 ENCOUNTER — Inpatient Hospital Stay (HOSPITAL_COMMUNITY): Payer: Self-pay | Admitting: Anesthesiology

## 2016-09-23 ENCOUNTER — Encounter (HOSPITAL_COMMUNITY): Payer: Self-pay

## 2016-09-23 ENCOUNTER — Encounter (HOSPITAL_COMMUNITY): Admission: EM | Disposition: A | Discharge status: Home Health | Payer: Self-pay | Source: Home / Self Care

## 2016-09-23 HISTORY — PX: INCISION AND DRAINAGE ABSCESS: SHX5864

## 2016-09-23 LAB — CBC
HEMATOCRIT: 43.5 % (ref 39.0–52.0)
HEMOGLOBIN: 15.8 g/dL (ref 13.0–17.0)
MCH: 22 pg — AB (ref 26.0–34.0)
MCHC: 36.3 g/dL — AB (ref 30.0–36.0)
MCV: 60.7 fL — ABNORMAL LOW (ref 78.0–100.0)
Platelets: 321 10*3/uL (ref 150–400)
RBC: 7.17 MIL/uL — AB (ref 4.22–5.81)
RDW: 15 % (ref 11.5–15.5)
WBC: 23.6 10*3/uL — ABNORMAL HIGH (ref 4.0–10.5)

## 2016-09-23 LAB — SURGICAL PCR SCREEN
MRSA, PCR: NEGATIVE
Staphylococcus aureus: NEGATIVE

## 2016-09-23 SURGERY — INCISION AND DRAINAGE, ABSCESS
Anesthesia: General | Site: Rectum

## 2016-09-23 MED ORDER — SUGAMMADEX SODIUM 200 MG/2ML IV SOLN
INTRAVENOUS | Status: AC
  Filled 2016-09-23: qty 2

## 2016-09-23 MED ORDER — FENTANYL CITRATE (PF) 250 MCG/5ML IJ SOLN
INTRAMUSCULAR | Status: AC
  Filled 2016-09-23: qty 5

## 2016-09-23 MED ORDER — ACETAMINOPHEN 325 MG PO TABS: 650.0000 mg | ORAL_TABLET | Freq: Four times a day (QID) | ORAL | Status: DC | PRN

## 2016-09-23 MED ORDER — HYDROMORPHONE HCL 2 MG/ML IJ SOLN
INTRAMUSCULAR | Status: AC
  Filled 2016-09-23: qty 1

## 2016-09-23 MED ORDER — 0.9 % SODIUM CHLORIDE (POUR BTL) OPTIME
TOPICAL | Status: DC | PRN
  Administered 2016-09-23: 1000 mL

## 2016-09-23 MED ORDER — SIMETHICONE 80 MG PO CHEW: 40.0000 mg | CHEWABLE_TABLET | Freq: Four times a day (QID) | ORAL | Status: DC | PRN

## 2016-09-23 MED ORDER — ONDANSETRON HCL 4 MG/2ML IJ SOLN: 4.0000 mg | Freq: Four times a day (QID) | INTRAMUSCULAR | Status: DC | PRN

## 2016-09-23 MED ORDER — BUPIVACAINE-EPINEPHRINE 0.25% -1:200000 IJ SOLN
INTRAMUSCULAR | Status: DC | PRN
  Administered 2016-09-23: 29 mL

## 2016-09-23 MED ORDER — SUGAMMADEX SODIUM 200 MG/2ML IV SOLN
INTRAVENOUS | Status: DC | PRN
  Administered 2016-09-23: 200 mg via INTRAVENOUS

## 2016-09-23 MED ORDER — DOCUSATE SODIUM 100 MG PO CAPS
100.0000 mg | ORAL_CAPSULE | Freq: Two times a day (BID) | ORAL | Status: DC
  Administered 2016-09-23: 100 mg via ORAL
  Filled 2016-09-23 (×2): qty 1

## 2016-09-23 MED ORDER — METHOCARBAMOL 500 MG PO TABS: 500.0000 mg | ORAL_TABLET | Freq: Four times a day (QID) | ORAL | Status: DC | PRN

## 2016-09-23 MED ORDER — MIDAZOLAM HCL 5 MG/5ML IJ SOLN
INTRAMUSCULAR | Status: DC | PRN
Start: 2016-09-23 — End: 2016-09-23
  Administered 2016-09-23: 2 mg via INTRAVENOUS

## 2016-09-23 MED ORDER — PROPOFOL 10 MG/ML IV BOLUS
INTRAVENOUS | Status: DC | PRN
  Administered 2016-09-23: 200 mg via INTRAVENOUS

## 2016-09-23 MED ORDER — LIDOCAINE 2% (20 MG/ML) 5 ML SYRINGE
INTRAMUSCULAR | Status: DC | PRN
  Administered 2016-09-23: 50 mg via INTRAVENOUS

## 2016-09-23 MED ORDER — HYDROMORPHONE HCL 1 MG/ML IJ SOLN
INTRAMUSCULAR | Status: DC | PRN
  Administered 2016-09-23: 0.5 mg via INTRAVENOUS
  Administered 2016-09-23: 1 mg via INTRAVENOUS
  Administered 2016-09-23: 0.5 mg via INTRAVENOUS

## 2016-09-23 MED ORDER — ONDANSETRON 4 MG PO TBDP: 4.0000 mg | ORAL_TABLET | Freq: Four times a day (QID) | ORAL | Status: DC | PRN

## 2016-09-23 MED ORDER — BUPIVACAINE-EPINEPHRINE (PF) 0.25% -1:200000 IJ SOLN
INTRAMUSCULAR | Status: AC
  Filled 2016-09-23: qty 30

## 2016-09-23 MED ORDER — HYDROMORPHONE HCL 1 MG/ML IJ SOLN
0.5000 mg | INTRAMUSCULAR | Status: DC | PRN
  Administered 2016-09-23 – 2016-09-24 (×2): 1 mg via INTRAVENOUS
  Administered 2016-09-24: 0.5 mg via INTRAVENOUS
  Administered 2016-09-24: 1 mg via INTRAVENOUS
  Administered 2016-09-25: 2 mg via INTRAVENOUS
  Administered 2016-09-25: 1 mg via INTRAVENOUS
  Filled 2016-09-23 (×2): qty 1
  Filled 2016-09-23: qty 2
  Filled 2016-09-23 (×3): qty 1

## 2016-09-23 MED ORDER — KCL IN DEXTROSE-NACL 20-5-0.45 MEQ/L-%-% IV SOLN
INTRAVENOUS | Status: DC
  Administered 2016-09-23: 16:00:00 via INTRAVENOUS
  Filled 2016-09-23 (×3): qty 1000

## 2016-09-23 MED ORDER — ACETAMINOPHEN 650 MG RE SUPP: 650.0000 mg | Freq: Four times a day (QID) | RECTAL | Status: DC | PRN

## 2016-09-23 MED ORDER — PROPOFOL 10 MG/ML IV BOLUS
INTRAVENOUS | Status: AC
  Filled 2016-09-23: qty 20

## 2016-09-23 MED ORDER — DIPHENHYDRAMINE HCL 12.5 MG/5ML PO ELIX: 12.5000 mg | ORAL_SOLUTION | Freq: Four times a day (QID) | ORAL | Status: DC | PRN

## 2016-09-23 MED ORDER — ROCURONIUM BROMIDE 10 MG/ML (PF) SYRINGE
PREFILLED_SYRINGE | INTRAVENOUS | Status: DC | PRN
  Administered 2016-09-23: 10 mg via INTRAVENOUS
  Administered 2016-09-23: 40 mg via INTRAVENOUS
  Administered 2016-09-23: 10 mg via INTRAVENOUS

## 2016-09-23 MED ORDER — FENTANYL CITRATE (PF) 100 MCG/2ML IJ SOLN
INTRAMUSCULAR | Status: DC | PRN
Start: 2016-09-23 — End: 2016-09-23
  Administered 2016-09-23: 50 ug via INTRAVENOUS
  Administered 2016-09-23: 100 ug via INTRAVENOUS
  Administered 2016-09-23 (×2): 50 ug via INTRAVENOUS

## 2016-09-23 MED ORDER — SODIUM CHLORIDE 0.9 % IV SOLN
3.0000 g | Freq: Four times a day (QID) | INTRAVENOUS | Status: DC
  Administered 2016-09-23 – 2016-09-25 (×10): 3 g via INTRAVENOUS
  Filled 2016-09-23 (×13): qty 3

## 2016-09-23 MED ORDER — KCL IN DEXTROSE-NACL 20-5-0.45 MEQ/L-%-% IV SOLN
INTRAVENOUS | Status: DC
  Administered 2016-09-23: 02:00:00 via INTRAVENOUS
  Filled 2016-09-23 (×2): qty 1000

## 2016-09-23 MED ORDER — FENTANYL CITRATE (PF) 100 MCG/2ML IJ SOLN
INTRAMUSCULAR | Status: AC
  Filled 2016-09-23: qty 4

## 2016-09-23 MED ORDER — ZOLPIDEM TARTRATE 5 MG PO TABS: 5.0000 mg | ORAL_TABLET | Freq: Every evening | ORAL | Status: DC | PRN

## 2016-09-23 MED ORDER — ONDANSETRON HCL 4 MG/2ML IJ SOLN
INTRAMUSCULAR | Status: DC | PRN
  Administered 2016-09-23: 4 mg via INTRAVENOUS

## 2016-09-23 MED ORDER — LIDOCAINE 2% (20 MG/ML) 5 ML SYRINGE
INTRAMUSCULAR | Status: AC
  Filled 2016-09-23: qty 5

## 2016-09-23 MED ORDER — OXYCODONE HCL 5 MG PO TABS
5.0000 mg | ORAL_TABLET | ORAL | Status: DC | PRN
  Administered 2016-09-23: 10 mg via ORAL
  Filled 2016-09-23: qty 2

## 2016-09-23 MED ORDER — LACTATED RINGERS IV SOLN
INTRAVENOUS | Status: DC
  Administered 2016-09-23: 11:00:00 via INTRAVENOUS

## 2016-09-23 MED ORDER — METOCLOPRAMIDE HCL 5 MG/ML IJ SOLN: 10.0000 mg | Freq: Once | INTRAMUSCULAR | Status: DC | PRN

## 2016-09-23 MED ORDER — ONDANSETRON HCL 4 MG/2ML IJ SOLN
INTRAMUSCULAR | Status: AC
  Filled 2016-09-23: qty 2

## 2016-09-23 MED ORDER — DIPHENHYDRAMINE HCL 50 MG/ML IJ SOLN: 12.5000 mg | Freq: Four times a day (QID) | INTRAMUSCULAR | Status: DC | PRN

## 2016-09-23 MED ORDER — LACTATED RINGERS IV SOLN: INTRAVENOUS | Status: DC

## 2016-09-23 MED ORDER — MIDAZOLAM HCL 2 MG/2ML IJ SOLN
INTRAMUSCULAR | Status: AC
Start: 2016-09-23 — End: 2016-09-23
  Filled 2016-09-23: qty 2

## 2016-09-23 MED ORDER — FENTANYL CITRATE (PF) 100 MCG/2ML IJ SOLN
25.0000 ug | INTRAMUSCULAR | Status: DC | PRN
  Administered 2016-09-23: 50 ug via INTRAVENOUS

## 2016-09-23 MED ORDER — MEPERIDINE HCL 50 MG/ML IJ SOLN
6.2500 mg | INTRAMUSCULAR | Status: DC | PRN
Start: 2016-09-23 — End: 2016-09-23

## 2016-09-23 MED ORDER — ROCURONIUM BROMIDE 50 MG/5ML IV SOSY
PREFILLED_SYRINGE | INTRAVENOUS | Status: AC
  Filled 2016-09-23: qty 10

## 2016-09-23 SURGICAL SUPPLY — 24 items
BNDG GAUZE ELAST 4 BULKY (GAUZE/BANDAGES/DRESSINGS) IMPLANT
DECANTER SPIKE VIAL GLASS SM (MISCELLANEOUS) IMPLANT
DRAPE LAPAROSCOPIC ABDOMINAL (DRAPES) IMPLANT
DRSG PAD ABDOMINAL 8X10 ST (GAUZE/BANDAGES/DRESSINGS) IMPLANT
ELECT REM PT RETURN 15FT ADLT (MISCELLANEOUS) ×3 IMPLANT
GAUZE SPONGE 4X4 12PLY STRL (GAUZE/BANDAGES/DRESSINGS) IMPLANT
GLOVE BIO SURGEON STRL SZ7.5 (GLOVE) ×3 IMPLANT
GLOVE ECLIPSE 7.5 STRL STRAW (GLOVE) ×3 IMPLANT
GOWN STRL REUS W/TWL XL LVL3 (GOWN DISPOSABLE) ×6 IMPLANT
KIT BASIN OR (CUSTOM PROCEDURE TRAY) ×3 IMPLANT
NEEDLE HYPO 25X1 1.5 SAFETY (NEEDLE) IMPLANT
NS IRRIG 1000ML POUR BTL (IV SOLUTION) ×3 IMPLANT
PACK GENERAL/GYN (CUSTOM PROCEDURE TRAY) ×3 IMPLANT
PAD ABD 8X10 STRL (GAUZE/BANDAGES/DRESSINGS) ×6 IMPLANT
SPONGE LAP 18X18 X RAY DECT (DISPOSABLE) IMPLANT
SUT MNCRL AB 4-0 PS2 18 (SUTURE) IMPLANT
SUT VIC AB 3-0 SH 27 (SUTURE)
SUT VIC AB 3-0 SH 27XBRD (SUTURE) IMPLANT
SWAB COLLECTION DEVICE MRSA (MISCELLANEOUS) IMPLANT
SWAB CULTURE ESWAB REG 1ML (MISCELLANEOUS) IMPLANT
SYR CONTROL 10ML LL (SYRINGE) IMPLANT
TAPE CLOTH SURG 4X10 WHT LF (GAUZE/BANDAGES/DRESSINGS) ×3 IMPLANT
TOWEL OR 17X26 10 PK STRL BLUE (TOWEL DISPOSABLE) ×3 IMPLANT
TOWEL OR NON WOVEN STRL DISP B (DISPOSABLE) IMPLANT

## 2016-09-23 NOTE — Anesthesia Postprocedure Evaluation (Signed)
Anesthesia Post Note  Patient: Martin BeersJames D Dimauro  Procedure(s) Performed: Procedure(s) (LRB): INCISION AND DRAINAGE ABSCESS PILONIDAL ABCESS (N/A)     Patient location during evaluation: PACU Anesthesia Type: General Level of consciousness: awake and alert Pain management: pain level controlled Vital Signs Assessment: post-procedure vital signs reviewed and stable Respiratory status: spontaneous breathing, nonlabored ventilation, respiratory function stable and patient connected to nasal cannula oxygen Cardiovascular status: stable and blood pressure returned to baseline Postop Assessment: no signs of nausea or vomiting Anesthetic complications: no    Last Vitals:  Vitals:   09/23/16 1315 09/23/16 1341  BP: 123/74 122/77  Pulse: 98 (!) 102  Resp: 19 20  Temp: 36.7 C 37 C    Last Pain:  Vitals:   09/23/16 1315  TempSrc:   PainSc: Asleep                 Phillips Groutarignan, Prentiss Polio

## 2016-09-23 NOTE — ED Notes (Signed)
Dr Donell BeersByerly at bedside pt informed of NPO status d/t possible surgery tonight.

## 2016-09-23 NOTE — Anesthesia Preprocedure Evaluation (Signed)
Anesthesia Evaluation  Patient identified by MRN, date of birth, ID band Patient awake    Reviewed: Allergy & Precautions, NPO status , Patient's Chart, lab work & pertinent test results  Airway Mallampati: II  TM Distance: >3 FB Neck ROM: Full    Dental no notable dental hx. (+) Poor Dentition   Pulmonary neg pulmonary ROS, Current Smoker,    Pulmonary exam normal breath sounds clear to auscultation       Cardiovascular negative cardio ROS Normal cardiovascular exam Rhythm:Regular Rate:Normal     Neuro/Psych negative neurological ROS  negative psych ROS   GI/Hepatic negative GI ROS, Neg liver ROS,   Endo/Other  negative endocrine ROS  Renal/GU negative Renal ROS  negative genitourinary   Musculoskeletal negative musculoskeletal ROS (+)   Abdominal   Peds negative pediatric ROS (+)  Hematology negative hematology ROS (+)   Anesthesia Other Findings   Reproductive/Obstetrics negative OB ROS                             Anesthesia Physical Anesthesia Plan  ASA: II  Anesthesia Plan: General   Post-op Pain Management:    Induction: Intravenous  PONV Risk Score and Plan: 1 and Ondansetron and Treatment may vary due to age  Airway Management Planned: Oral ETT  Additional Equipment:   Intra-op Plan:   Post-operative Plan: Extubation in OR  Informed Consent: I have reviewed the patients History and Physical, chart, labs and discussed the procedure including the risks, benefits and alternatives for the proposed anesthesia with the patient or authorized representative who has indicated his/her understanding and acceptance.   Dental advisory given  Plan Discussed with: CRNA  Anesthesia Plan Comments:         Anesthesia Quick Evaluation

## 2016-09-23 NOTE — Transfer of Care (Signed)
Immediate Anesthesia Transfer of Care Note  Patient: Martin Crawford  Procedure(s) Performed: Procedure(s): INCISION AND DRAINAGE ABSCESS PILONIDAL ABCESS (N/A)  Patient Location: PACU  Anesthesia Type:General  Level of Consciousness:  sedated, patient cooperative and responds to stimulation  Airway & Oxygen Therapy:Patient Spontanous Breathing and Patient connected to face mask oxgen  Post-op Assessment:  Report given to PACU RN and Post -op Vital signs reviewed and stable  Post vital signs:  Reviewed and stable  Last Vitals:  Vitals:   09/23/16 0140 09/23/16 0434  BP: 140/90 119/67  Pulse: 92 73  Resp: 20 16  Temp: 37.2 C 36.7 C    Complications: No apparent anesthesia complications

## 2016-09-23 NOTE — Op Note (Signed)
Preoperative Diagnosis: Cellulitis of buttock [L03.317] Pilonidal abscess [L05.01]  Postoprative Diagnosis: Cellulitis of buttock [L03.317] Pilonidal abscess [L05.01]  Procedure: Procedure(s): INCISION AND DRAINAGE AND DEBRIDEMENT OF  PILONIDAL ABCESS   Surgeon: Glenna FellowsHoxworth, Lenni Reckner T   Assistants: None  Anesthesia:  General endotracheal anesthesia  Indications: Patient is a 41 year old male with a history of pilonidal cyst who presents with 2 weeks of progressive swelling pain and then purulent drainage from the sacrococcygeal area. Examination has revealed a large pilonidal abscess with purulent drainage through a sinus tract. We have recommended proceeding with incision and drainage and debridement. The procedure and recovery and indications were discussed with the patient as well as risks of anesthetic complications and bleeding and wound healing issues and he understands and agrees to proceed.    Procedure Detail:  Patient was brought to the operating room, general endotracheal anesthesia induced and the stretcher and is carefully rolled and positioned in padded prone position. Buttocks were taped apart. Sacrococcygeal area widely sterilely prepped and draped. He was on broad-spectrum antibiotics. Patient timeout was performed and correct procedure verified. There was a large palpable fluctuant abscess superiorly over the distal sacrum with purulent drainage through an opening and granulation tissue inferiorly. There was a previous small incision and drainage site more superiorly on the abscess. I made an elliptical incision excising the skin encompassing the inferior draining sinus tract in the previous superior IND site widely unroofing the abscess. There was large amount of pus that was drained and cultured. Cautery was used to debride shaggy tissue from the abscess wall and base of the abscess. Overall the abscess cavity measured 8 cm in length by 5 cm in width by 4 cm in depth. I debrided  an ellipse of skin measuring 5 x 3 cm x 1 cm deep and an additional 2 or 3 cm of abscess wall back to relatively healthy appearing tissue. The wound was thoroughly irrigated. Hemostasis was obtained with cautery. The soft tissue was widely infiltrated with Marcaine with epinephrine. The wound was packed open with moist Kerlix gauze. Sponge needle and instrument counts were correct.    Findings: As above  Estimated Blood Loss:  less than 50 mL         Drains: Wound packed with moist Kerlix  Blood Given: none          Specimens: Culture and sensitivity, skin and subcutaneous tissue and abscess wall        Complications:  * No complications entered in OR log *         Disposition: PACU - hemodynamically stable.         Condition: stable

## 2016-09-23 NOTE — ED Notes (Signed)
Pt transported to floor via stretcher condition stable pt aware of NPO status and not very happy about it but states will comply.

## 2016-09-23 NOTE — Anesthesia Procedure Notes (Signed)
Procedure Name: Intubation Date/Time: 09/23/2016 11:28 AM Performed by: Anne Fu Pre-anesthesia Checklist: Patient identified, Emergency Drugs available, Suction available, Patient being monitored and Timeout performed Patient Re-evaluated:Patient Re-evaluated prior to inductionOxygen Delivery Method: Circle system utilized Preoxygenation: Pre-oxygenation with 100% oxygen Intubation Type: IV induction Ventilation: Mask ventilation without difficulty Laryngoscope Size: Mac and 4 Grade View: Grade I Tube type: Oral Tube size: 7.5 mm Number of attempts: 1 Airway Equipment and Method: Stylet Placement Confirmation: ETT inserted through vocal cords under direct vision,  positive ETCO2,  CO2 detector and breath sounds checked- equal and bilateral Secured at: 22 cm Tube secured with: Tape Dental Injury: Teeth and Oropharynx as per pre-operative assessment

## 2016-09-23 NOTE — Interval H&P Note (Signed)
History and Physical Interval Note:  09/23/2016 10:49 AM  Martin Crawford  has presented today for surgery, with the diagnosis of Pilonidal abcess  The various methods of treatment have been discussed with the patient and family. After consideration of risks, benefits and other options for treatment, the patient has consented to  Procedure(s): INCISION AND DRAINAGE ABSCESS PILONIDAL ABCESS (N/A) as a surgical intervention .  The patient's history has been reviewed, patient examined, no change in status, stable for surgery.  I have reviewed the patient's chart and labs.  Questions were answered to the patient's satisfaction.     Kathlene Yano T

## 2016-09-24 ENCOUNTER — Encounter (HOSPITAL_COMMUNITY): Payer: Self-pay | Admitting: General Surgery

## 2016-09-24 MED ORDER — OXYCODONE-ACETAMINOPHEN 5-325 MG PO TABS
1.0000 | ORAL_TABLET | ORAL | Status: DC | PRN
  Administered 2016-09-24: 1 via ORAL
  Administered 2016-09-24 – 2016-09-25 (×3): 2 via ORAL
  Filled 2016-09-24: qty 1
  Filled 2016-09-24 (×3): qty 2

## 2016-09-24 NOTE — Care Management Note (Signed)
Case Management Note  Patient Details  Name: Carlisle BeersJames D Laur MRN: [id number] Date of Birth: 07/21/1975  Subjective/Objective:                  Procedure: Procedure(s): INCISION AND DRAINAGE AND DEBRIDEMENT OF  PILONIDAL ABCESS  Action/Plan: Date:  September 24, 2016  Chart reviewed for concurrent status and case management needs.  Will continue to follow patient progress.  Discharge Planning: following for needs  Expected discharge date: [id number]  Marcelle SmilingRhonda Keonta Alsip, BSN, [place]RN3, [place]CCM   [phone]   Expected Discharge Date:                  Expected Discharge Plan:  Home/Self Care  In-House Referral:     Discharge planning Services  CM Consult  Post Acute Care Choice:    Choice offered to:     DME Arranged:    DME Agency:     HH Arranged:    HH Agency:     Status of Service:  In process, will continue to follow  If discussed at Long Length of Stay Meetings, dates discussed:    Additional Comments:  Golda AcreDavis, Nyrie Sigal Lynn, RN 09/24/2016, 9:26 AM

## 2016-09-24 NOTE — Progress Notes (Signed)
[facility] Progress Note  1 Day Post-Op  Subjective: CC: pilonidal cyst  States oxycodone is not helping his pain but percocet has helped in the past. Mobilizing. Tolerating PO. Having bowel movements. Wife at bedside and willing to perform daily dressing changes at home.  Objective: Vital signs in last 24 hours: Temp:  [98.1 F (36.7 C)-98.7 F (37.1 C)] 98.3 F (36.8 C) (06/06 0559) Pulse Rate:  [79-102] 79 (06/06 0559) Resp:  [18-25] 18 (06/06 0559) BP: (114-139)/(71-93) 121/93 (06/06 0559) SpO2:  [93 %-100 %] 97 % (06/06 0559) Last BM Date: 09/22/16  Intake/Output from previous day: 06/05 0701 - 06/06 0700 In: 2081.7 [P.O.:240; I.V.:941.7; IV Piggyback:900] Out: 1120 [Urine:660; Stool:450; Blood:10] Intake/Output this shift: No intake/output data recorded.  PE: Gen:  Alert, NAD, pleasant  Pulm:  Normal effort Abd: Soft, non-tender, non-distended GU: dressing changed performed at bedside. Appropriately tender. Wound granulating nicely. There was moderate bleeding from the inferior aspect of wound bed. Direct pressure was applied and and a pressure dressing placed. Patient educated to sit directly on wound for at least 15 minutes s/p dressing change. Pt tolerated dressing change well after pre-medication with PO pain medicine and 1 mg dilaudid. Psych: A&Ox3   Lab Results:   Recent Labs  09/22/16 1950 09/23/16 0426  WBC 25.1* 23.6*  HGB 16.5 15.8  HCT 44.9 43.5  PLT 326 321   BMET  Recent Labs  09/22/16 1950  NA 138  K 3.9  CL 103  CO2 26  GLUCOSE 112*  BUN 10  CREATININE 0.98  CALCIUM 10.0   PT/INR No results for input(s): LABPROT, INR in the last 72 hours. CMP     Component Value Date/Time   NA 138 09/22/2016 1950   K 3.9 09/22/2016 1950   CL 103 09/22/2016 1950   CO2 26 09/22/2016 1950   GLUCOSE 112 (H) 09/22/2016 1950   BUN 10 09/22/2016 1950   CREATININE 0.98 09/22/2016 1950   CALCIUM 10.0 09/22/2016 1950   PROT 7.7  09/16/2016 2200   ALBUMIN 3.8 09/16/2016 2200   AST 23 09/16/2016 2200   ALT 27 09/16/2016 2200   ALKPHOS 91 09/16/2016 2200   BILITOT 0.8 09/16/2016 2200   GFRNONAA >60 09/22/2016 1950   GFRAA >60 09/22/2016 1950   Lipase  No results found for: LIPASE     Studies/Results: Ct Pelvis W Contrast  Result Date: 09/22/2016 CLINICAL DATA:  Worsening pilonidal abscess. EXAM: CT PELVIS WITH CONTRAST TECHNIQUE: Multidetector CT imaging of the pelvis was performed using the standard protocol following the bolus administration of intravenous contrast. CONTRAST:  ISOVUE-300 IOPAMIDOL (ISOVUE-300) INJECTION 61% COMPARISON:  09/16/2016 FINDINGS: Urinary Tract:  No abnormality visualized. Bowel:  Unremarkable visualized pelvic bowel loops. Vascular/Lymphatic: No pathologically enlarged lymph nodes. No significant vascular abnormality seen. Reproductive:  No mass or other significant abnormality Other: There has been interval increase of the size of the known pilonidal abscess which now measures 5.4 by 4.1 by 10 cm. The abscess is centered in the subcutaneous tissues with a preserved fat plane to the muscular fascia. It however abuts the inferior-most portion of the posterior coccyx. No evidence of lytic bony changes. Musculoskeletal: No suspicious bone lesions identified. IMPRESSION: Increase of the size of known pilonidal abscess which now measures up to 10 cm in craniocaudal dimension. No evidence of involvement of the underlying musculature, however no fat plane is identified between the anterior portion of the abscess and the tip of the coccyx. Attention on future follow-up  is recommended. Electronically Signed   By: Ted Mcalpineobrinka  Dimitrova M.D.   On: 09/22/2016 21:13   Anti-infectives: Anti-infectives    Start     Dose/Rate Route Frequency Ordered Stop   09/23/16 0145  Ampicillin-Sulbactam (UNASYN) 3 g in sodium chloride 0.9 % 100 mL IVPB     3 g 200 mL/hr over 30 Minutes Intravenous Every 6 hours  09/23/16 0142       Assessment/Plan  Pilonidal abscess S/p INCISION AND DRAINAGE 09/23/16 Dr. Johna SheriffHoxworth - follow pathology and cultures - daily wet-to-dry dressing changes: [facility]  - continue current diet  - continue IV abx  - mobilize as tolerated if bleeding remains controlled    LOS: 2 days    Adam PhenixElizabeth S Charmika Macdonnell , [facility] 09/24/2016, 9:45 AM Pager: [phone] Consults: [phone] Mon-Fri 7:00 am-4:30 pm Sat-Sun 7:00 am-11:30 am

## 2016-09-25 LAB — CBC
HCT: 42.5 % (ref 39.0–52.0)
Hemoglobin: 15 g/dL (ref 13.0–17.0)
MCH: 21.7 pg — ABNORMAL LOW (ref 26.0–34.0)
MCHC: 35.3 g/dL (ref 30.0–36.0)
MCV: 61.6 fL — ABNORMAL LOW (ref 78.0–100.0)
PLATELETS: 322 10*3/uL (ref 150–400)
RBC: 6.9 MIL/uL — AB (ref 4.22–5.81)
RDW: 15.1 % (ref 11.5–15.5)
WBC: 15.8 10*3/uL — AB (ref 4.0–10.5)

## 2016-09-25 MED ORDER — ACETAMINOPHEN 325 MG PO TABS: 650.0000 mg | ORAL_TABLET | Freq: Four times a day (QID) | ORAL | Status: DC | PRN

## 2016-09-25 MED ORDER — OXYCODONE-ACETAMINOPHEN 5-325 MG PO TABS: 1.0000 | ORAL_TABLET | Freq: Four times a day (QID) | ORAL | 0 refills | Status: DC | PRN

## 2016-09-25 MED ORDER — SULFAMETHOXAZOLE-TRIMETHOPRIM 800-160 MG PO TABS: 1.0000 | ORAL_TABLET | Freq: Two times a day (BID) | ORAL | 1 refills | Status: DC

## 2016-09-25 MED ORDER — OXYCODONE HCL 5 MG PO TABS: 5.0000 mg | ORAL_TABLET | Freq: Four times a day (QID) | ORAL | 0 refills | Status: DC | PRN

## 2016-09-25 NOTE — Progress Notes (Signed)
Pts IV removed with a clean dressing intact. Pts dressing remains clean and intact on buttock. Pt denies pain at the time of discharge. Pt ambulated with nursing staff and family to the front entrance.

## 2016-09-25 NOTE — Progress Notes (Signed)
Pt would like to go outside and walk around for a little. MD paged and stated it was ok for patient to go outside and to wrap his IV site.

## 2016-09-25 NOTE — Discharge Instructions (Signed)
WOUND CARE: - dressing to be changed daily - supplies: sterile saline, kerlix (roll of gauze), scissors, ABD pads, tape  - remove dressing and all packing carefully, moistening with sterile saline as needed to avoid packing/internal dressing sticking to the wound. - clean edges of skin around the wound with water/gauze, making sure there is no tape debris or leakage left on skin that could cause skin irritation or breakdown. - dampen a clean kerlix with sterile saline and pack wound from wound base to skin level, making sure to take note of any possible areas of wound tracking, tunneling and packing appropriately. Wound can be packed loosely. Trim kerlix to size if a whole kerlix is not required. - cover wound with a dry ABD pad and secure with tape.  - write the date/time on the dry dressing/tape to better track when the last dressing change occurred. - apply any skin protectant/powder recommended by clinician to protect skin/skin folds. - change dressing as needed if leakage occurs, wound gets contaminated, or patient requests to shower. - patient may shower daily with wound open and following the shower the wound should be dried and a clean dressing placed.   How to Make Homemade Saline Solution Normal Saline This solution is not that hard to prepare. The following things will be needed: One jar having a lid or a clean glass bottle  pot having a lid  table salt Then follow the steps below: 1. Put half tsp. of salt and one cup of water into the pot and cover the pot with the lid. 2. Keeping the lid on, boil the mixture for around 15 minutes. 3. Let the pot cool down to room temperature by setting it aside for a while. 4. Pour out the normal saline carefully from the pot into the bottle or jar and cover it with a lid. Tips and Precautions for Preparation and Storage Only freshly prepared normal saline should be used as there is a chance that bacteria might grow in it, causing infections.    For storing the normal saline, always make use of a bottle or glass jar that has been washed with hot soapy water in a dishwasher recently.  Refrain from drinking the homemade saline solution.  If the solution becomes dirty or cloudy, then it is better to discard it.  24 hours is the maximum duration for storing saline in a glass or bottle. Wash the container before discarding the solution that has not been used.  The preparation methods for saline solutions that are used for different situations are not the same and have their own separate recipes.

## 2016-09-25 NOTE — Discharge Summary (Signed)
[facility] Discharge Summary   Patient ID: Martin BeersJames D Robart MRN: [id number] DOB/AGE: 40/12/1975 40 y.o.  Admit date: 09/22/2016 Discharge date: 09/25/2016  Discharge Diagnosis Patient Active Problem List   Diagnosis Date Noted  . Pilonidal abscess 09/22/2016  . Abscess and cellulitis of gluteal region 09/17/2016  . Sepsis (HCC) 09/17/2016  . Tobacco abuse 09/17/2016  . Chronic back pain   . Asthma    Imaging: 09/22/16 CT PELVIS W/:  Increase of the size of known pilonidal abscess which now measures up to 10 cm in craniocaudal dimension.  No evidence of involvement of the underlying musculature, however no fat plane is identified between the anterior portion of the abscess and the tip of the coccyx. Attention on future follow-up is Recommended.  Procedures 09/23/16 (Dr. Glenna FellowsBenjamin Hoxworth) - Incision and drainage pilonidal abscess    Hospital Course:  41 y.o. AA male with a history of recurrent pilonidal abscess [facility] with 3 weeks of progressively worsening gluteal cleft pain. He was recently admitted to the hospital (5/30-5/31) for management of sepsis 2/2 pilonidal abscess. Drainage attempted in ED and he was treated with IV antibiotics. During previous admission I&D in OR was offered but patient declined. He returns with worsening symptoms. He was admitted, started on IV abx, and underwent the procedure above. Tolerated the procedure well. Post-operatively, he remained in the hospital for 36 hours for continued IV abx and wound care. Initial dressing change significant for bleeding from inferior aspect of wound- this resolved with 15 minutes of pressure, followed by placement of surgicel in wound. Patient was hemodynamically stable (hgb 15), VSS, pain controlled, and stable for discharge home. He will follow up with Dr. Johna SheriffHoxworth for wound check in our office.   Physical Exam: General:  Alert, NAD, pleasant, comfortable Pulm: non-labored Abd:  Soft,  non-tender, non-distended Wound: 5 cm long x 3 cm wide x 1 cm deep; granulating appropriately. No purulence. No necrosis.   Allergies as of 09/25/2016   No Known Allergies     Medication List    STOP taking these medications   doxycycline 100 MG capsule Commonly known as:  VIBRAMYCIN   oxyCODONE-acetaminophen 5-325 MG tablet Commonly known as:  PERCOCET/ROXICET     TAKE these medications   acetaminophen 325 MG tablet Commonly known as:  TYLENOL Take 2 tablets (650 mg total) by mouth every 6 (six) hours as needed for mild pain (or temp > 100).   oxyCODONE 5 MG immediate release tablet Commonly known as:  Oxy IR/ROXICODONE Take 1-2 tablets (5-10 mg total) by mouth every 6 (six) hours as needed for severe pain.   sulfamethoxazole-trimethoprim 800-160 MG tablet Commonly known as:  BACTRIM DS,SEPTRA DS Take 1 tablet by mouth 2 (two) times daily.         Signed: Hosie SpangleElizabeth Ran Tullis, [facility] 09/25/2016, 1:41 PM Pager: [phone] Consults: [phone] Mon-Fri 7:00 am-4:30 pm Sat-Sun 7:00 am-11:30 am

## 2016-09-25 NOTE — Progress Notes (Signed)
Pt off the unit with wife to go outside and get some fresh air briefly. Pts IV site is clean and intact and wrapped with coban to prevent damage. Pt knows boundaries and to return.

## 2016-09-25 NOTE — Progress Notes (Signed)
Pt returned to the unit.

## 2016-09-28 LAB — AEROBIC/ANAEROBIC CULTURE W GRAM STAIN (SURGICAL/DEEP WOUND)

## 2016-09-28 LAB — AEROBIC/ANAEROBIC CULTURE (SURGICAL/DEEP WOUND): CULTURE: NORMAL

## 2016-10-08 ENCOUNTER — Encounter: Payer: Managed Care, Other (non HMO) | Admitting: Hematology

## 2016-10-29 ENCOUNTER — Ambulatory Visit (HOSPITAL_COMMUNITY)
Admission: EM | Admit: 2016-10-29 | Discharge: 2016-10-29 | Disposition: A | Discharge status: Home / Self Care | Payer: Self-pay | Attending: Internal Medicine | Admitting: Internal Medicine

## 2016-10-29 ENCOUNTER — Encounter (HOSPITAL_COMMUNITY): Payer: Self-pay | Admitting: Emergency Medicine

## 2016-10-29 DIAGNOSIS — K0889 Other specified disorders of teeth and supporting structures: Secondary | ICD-10-CM

## 2016-10-29 DIAGNOSIS — K047 Periapical abscess without sinus: Secondary | ICD-10-CM

## 2016-10-29 DIAGNOSIS — K029 Dental caries, unspecified: Secondary | ICD-10-CM

## 2016-10-29 NOTE — ED Provider Notes (Signed)
CSN: [id number]     Arrival date & time 10/29/16  1434 History   First MD Initiated Contact with Patient 10/29/16 1538     Chief Complaint  Patient presents with  . Dental Problem   (Consider location/radiation/quality/duration/timing/severity/associated sxs/prior Treatment) HPI  Past Medical History:  Diagnosis Date  . Asthma   . Chronic back pain    Past Surgical History:  Procedure Laterality Date  . ABSCESS DRAINAGE     pilonidal abscess  . INCISION AND DRAINAGE ABSCESS N/A 09/23/2016   Procedure: INCISION AND DRAINAGE ABSCESS PILONIDAL ABCESS;  Surgeon: Glenna FellowsHoxworth, Benjamin, MD;  Location: [facility];  Service: General;  Laterality: N/A;   Family History  Problem Relation Age of Onset  . Stroke Mother   . Hypertension Mother   . Asthma Father   . Hypertension Father    Social History  Substance Use Topics  . Smoking status: Current Every Day Smoker    Packs/day: 1.00    Years: 28.00    Types: Cigarettes  . Smokeless tobacco: Never Used  . Alcohol use Yes    Review of Systems  Allergies  Patient has no known allergies.  Home Medications   Prior to Admission medications   Not on File   Meds Ordered and Administered this Visit  Medications - No data to display  BP 132/68 (BP Location: Right Arm)   Pulse 83   Temp 98.2 F (36.8 C) (Oral)   Resp 18   SpO2 97%  No data found.   Physical Exam  Urgent Care Course     Procedures (including critical care time)  Labs Review Labs Reviewed - No data to display  Imaging Review No results found.   Visual Acuity Review  Right Eye Distance:   Left Eye Distance:   Bilateral Distance:    Right Eye Near:   Left Eye Near:    Bilateral Near:         MDM   1. Dental caries   2. Pain, dental   3. Dental infection    Verbal to pt. Written Rx for Norco 5/325 1 q 4h prn #15 Amoxicillin 500 2 bid x 10 d     Hayden RasmussenMabe, Tayven Renteria, NP 10/29/16 1622

## 2016-10-29 NOTE — ED Triage Notes (Signed)
[facility]. The patient stated that he was at the dentist today and [facility]

## 2016-10-29 NOTE — Discharge Instructions (Signed)
Verbal to pt. Written Rx for Norco 5/325 1 q 4h prn #15 Amoxicillin 500 2 bid x 10 d

## 2016-12-16 ENCOUNTER — Ambulatory Visit (HOSPITAL_COMMUNITY)
Admission: EM | Admit: 2016-12-16 | Discharge: 2016-12-16 | Disposition: A | Discharge status: Home / Self Care | Payer: Self-pay | Attending: Emergency Medicine | Admitting: Emergency Medicine

## 2016-12-16 ENCOUNTER — Encounter (HOSPITAL_COMMUNITY): Payer: Self-pay | Admitting: Emergency Medicine

## 2016-12-16 DIAGNOSIS — K029 Dental caries, unspecified: Secondary | ICD-10-CM

## 2016-12-16 DIAGNOSIS — K047 Periapical abscess without sinus: Secondary | ICD-10-CM

## 2016-12-16 MED ORDER — HYDROCODONE-ACETAMINOPHEN 5-325 MG PO TABS: 1.0000 | ORAL_TABLET | ORAL | 0 refills | Status: DC | PRN

## 2016-12-16 MED ORDER — PENICILLIN V POTASSIUM 500 MG PO TABS: 500.0000 mg | ORAL_TABLET | Freq: Four times a day (QID) | ORAL | 0 refills | Status: AC

## 2016-12-16 NOTE — ED Triage Notes (Signed)
PT reports pain and swelling over left lower jaw. PT has had issues with these teeth before. Waiting on a dentist appt.

## 2016-12-16 NOTE — Discharge Instructions (Signed)
For your dental infection, I have prescribed penicillin, take one tablet 4 times a day. You will need to follow-up with the dentist otherwise your pain will continue to recur. I have also prescribed a medicine for pain called hydrocodone, this medicine is a narcotic, it will cause drowsiness, and it is addictive. Do not take more than what is necessary, do not drink alcohol while taking, and do not operate any heavy machinery while taking this medicine.

## 2016-12-16 NOTE — ED Provider Notes (Signed)
  [facility]   [id number] 12/16/16 Arrival Time: 1722   SUBJECTIVE:  Martin Crawford is a 41 y.o. male who presents to the urgent care with complaint of dental pain. He was seen here in this clinic in July for the same problem, reports he has not seen a dentist yet, but does have a appointment scheduled for next week. After a course of pain medicine and antibiotics his symptoms improved, but they are now back. No fever or chills, no nausea or vomiting, no weakness, or other signs of systemic infection. Pain is worse with hot and cold foods, and with pressure and chewing  ROS: As per HPI, remainder of ROS negative.   OBJECTIVE:  Vitals:   12/16/16 1810 12/16/16 1811  BP:  139/86  Pulse:  80  Resp:  16  Temp:  98.5 F (36.9 C)  SpO2:  98%  Weight: 235 lb (106.6 kg)   Height: 5\' 9"  (1.753 m)      General appearance: alert; no distress HEENT: normocephalic; atraumatic; conjunctivae normal; Has notably poor dental patient with multiple dental caries, it appears as if there is an infection at the base of the 19th tooth Neck: Trachea midline, no JVD noted, no cervical lymphadenopathy Lungs: clear to auscultation bilaterally Heart: regular rate and rhythm Abdomen: soft, non-tender; bowel sounds normal; no masses or organomegaly; no guarding or rebound tenderness Musculoskeletal: Grossly symmetrical Skin: warm and dry Neurologic: Grossly normal Psychological:  alert and cooperative; normal mood and affect     ASSESSMENT & PLAN:  1. Dental caries   2. Dental infection     Meds ordered this encounter  Medications  . penicillin v potassium (VEETID) 500 MG tablet    Sig: Take 1 tablet (500 mg total) by mouth 4 (four) times daily.    Dispense:  40 tablet    Refill:  0    Order Specific Question:   Supervising Provider    Answer:   Domenick Gong [[id number]]  . HYDROcodone-acetaminophen (NORCO/VICODIN) 5-325 MG tablet    Sig: Take 1 tablet by mouth every 4 (four)  hours as needed.    Dispense:  15 tablet    Refill:  0    Order Specific Question:   Supervising Provider    Answer:   Domenick Gong [[id number]]    Follow-up with dentistry  [facility] controlled substances reporting system consulted prior to issuing prescription, over the last year he has had 3 prescriptions for controlled medicines, all for short durations, this appears to be therapeutically appropriate based on records reviewed  Reviewed expectations re: course of current medical issues. Questions answered. Outlined signs and symptoms indicating need for more acute intervention. Patient verbalized understanding. After Visit Summary given.    Procedures:     Labs Reviewed - No data to display  No results found.  No Known Allergies  PMHx, SurgHx, SocialHx, Medications, and Allergies were reviewed in the Visit Navigator and updated as appropriate.       Dorena Bodo, NP 12/16/16 1905

## 2017-01-14 ENCOUNTER — Encounter (HOSPITAL_COMMUNITY): Payer: Self-pay | Admitting: Emergency Medicine

## 2017-01-14 ENCOUNTER — Emergency Department (HOSPITAL_COMMUNITY)
Admission: EM | Admit: 2017-01-14 | Discharge: 2017-01-14 | Disposition: A | Discharge status: Home / Self Care | Payer: Self-pay | Attending: Emergency Medicine | Admitting: Emergency Medicine

## 2017-01-14 ENCOUNTER — Emergency Department (HOSPITAL_COMMUNITY): Payer: Self-pay

## 2017-01-14 DIAGNOSIS — J45909 Unspecified asthma, uncomplicated: Secondary | ICD-10-CM | POA: Insufficient documentation

## 2017-01-14 DIAGNOSIS — R059 Cough, unspecified: Secondary | ICD-10-CM

## 2017-01-14 DIAGNOSIS — M545 Low back pain: Secondary | ICD-10-CM | POA: Insufficient documentation

## 2017-01-14 DIAGNOSIS — R1032 Left lower quadrant pain: Secondary | ICD-10-CM | POA: Insufficient documentation

## 2017-01-14 DIAGNOSIS — F17210 Nicotine dependence, cigarettes, uncomplicated: Secondary | ICD-10-CM | POA: Insufficient documentation

## 2017-01-14 DIAGNOSIS — R05 Cough: Secondary | ICD-10-CM | POA: Insufficient documentation

## 2017-01-14 LAB — URINALYSIS, ROUTINE W REFLEX MICROSCOPIC
BILIRUBIN URINE: NEGATIVE
Glucose, UA: NEGATIVE mg/dL
Hgb urine dipstick: NEGATIVE
Ketones, ur: NEGATIVE mg/dL
Leukocytes, UA: NEGATIVE
Nitrite: NEGATIVE
PROTEIN: NEGATIVE mg/dL
SPECIFIC GRAVITY, URINE: 1.045 — AB (ref 1.005–1.030)
pH: 5 (ref 5.0–8.0)

## 2017-01-14 LAB — CBC WITH DIFFERENTIAL/PLATELET
Basophils Absolute: 0 K/uL (ref 0.0–0.1)
Basophils Relative: 0 %
Eosinophils Absolute: 0.3 K/uL (ref 0.0–0.7)
Eosinophils Relative: 2 %
HCT: 47 % (ref 39.0–52.0)
Hemoglobin: 17.1 g/dL — ABNORMAL HIGH (ref 13.0–17.0)
Lymphocytes Relative: 23 %
Lymphs Abs: 3.9 K/uL (ref 0.7–4.0)
MCH: 22 pg — ABNORMAL LOW (ref 26.0–34.0)
MCHC: 36.4 g/dL — ABNORMAL HIGH (ref 30.0–36.0)
MCV: 60.3 fL — ABNORMAL LOW (ref 78.0–100.0)
Monocytes Absolute: 1.5 K/uL — ABNORMAL HIGH (ref 0.1–1.0)
Monocytes Relative: 9 %
Neutro Abs: 11.1 K/uL — ABNORMAL HIGH (ref 1.7–7.7)
Neutrophils Relative %: 66 %
Platelets: 220 K/uL (ref 150–400)
RBC: 7.79 MIL/uL — ABNORMAL HIGH (ref 4.22–5.81)
RDW: 16 % — ABNORMAL HIGH (ref 11.5–15.5)
WBC: 16.8 K/uL — ABNORMAL HIGH (ref 4.0–10.5)

## 2017-01-14 LAB — COMPREHENSIVE METABOLIC PANEL
ALK PHOS: 106 U/L (ref 38–126)
ALT: 57 U/L (ref 17–63)
ANION GAP: 9 (ref 5–15)
AST: 34 U/L (ref 15–41)
Albumin: 3.7 g/dL (ref 3.5–5.0)
BILIRUBIN TOTAL: 0.6 mg/dL (ref 0.3–1.2)
BUN: 11 mg/dL (ref 6–20)
CALCIUM: 9.6 mg/dL (ref 8.9–10.3)
CO2: 23 mmol/L (ref 22–32)
Chloride: 105 mmol/L (ref 101–111)
Creatinine, Ser: 0.91 mg/dL (ref 0.61–1.24)
GFR calc Af Amer: >60 mL/min (ref >60)
GLUCOSE: 153 mg/dL — AB (ref 65–99)
POTASSIUM: 4.1 mmol/L (ref 3.5–5.1)
Sodium: 137 mmol/L (ref 135–145)
TOTAL PROTEIN: 7.4 g/dL (ref 6.5–8.1)

## 2017-01-14 MED ORDER — KETOROLAC TROMETHAMINE 30 MG/ML IJ SOLN
30.0000 mg | Freq: Once | INTRAMUSCULAR | Status: DC
  Administered 2017-01-14: 30 mg via INTRAVENOUS
  Filled 2017-01-14: qty 1

## 2017-01-14 MED ORDER — MORPHINE SULFATE (PF) 4 MG/ML IV SOLN
4.0000 mg | Freq: Once | INTRAVENOUS | Status: AC
  Administered 2017-01-14: 4 mg via INTRAVENOUS
  Filled 2017-01-14: qty 1

## 2017-01-14 MED ORDER — SODIUM CHLORIDE 0.9 % IV BOLUS (SEPSIS)
1000.0000 mL | Freq: Once | INTRAVENOUS | Status: AC
  Administered 2017-01-14: 1000 mL via INTRAVENOUS

## 2017-01-14 MED ORDER — IPRATROPIUM BROMIDE 0.02 % IN SOLN
0.5000 mg | Freq: Once | RESPIRATORY_TRACT | Status: AC
Start: 2017-01-14 — End: 2017-01-14
  Administered 2017-01-14: 0.5 mg via RESPIRATORY_TRACT
  Filled 2017-01-14: qty 2.5

## 2017-01-14 MED ORDER — IOPAMIDOL (ISOVUE-300) INJECTION 61%
INTRAVENOUS | Status: AC
  Filled 2017-01-14: qty 100

## 2017-01-14 MED ORDER — CYCLOBENZAPRINE HCL 10 MG PO TABS: 10.0000 mg | ORAL_TABLET | Freq: Two times a day (BID) | ORAL | 0 refills | Status: DC | PRN

## 2017-01-14 MED ORDER — GUAIFENESIN 100 MG/5ML PO SOLN: 10.0000 mL | ORAL | 0 refills | Status: DC | PRN

## 2017-01-14 MED ORDER — ALBUTEROL SULFATE (2.5 MG/3ML) 0.083% IN NEBU
5.0000 mg | INHALATION_SOLUTION | Freq: Once | RESPIRATORY_TRACT | Status: AC
  Administered 2017-01-14: 5 mg via RESPIRATORY_TRACT
  Filled 2017-01-14: qty 6

## 2017-01-14 MED ORDER — IOPAMIDOL (ISOVUE-300) INJECTION 61%
100.0000 mL | Freq: Once | INTRAVENOUS | Status: AC | PRN
  Administered 2017-01-14: 100 mL via INTRAVENOUS

## 2017-01-14 MED ORDER — BENZONATATE 100 MG PO CAPS: 100.0000 mg | ORAL_CAPSULE | Freq: Three times a day (TID) | ORAL | 0 refills | Status: DC | PRN

## 2017-01-14 NOTE — ED Provider Notes (Signed)
[facility]-EMERGENCY DEPT Provider Note   CSN: [id number] Arrival date & time: 01/14/17  1118     History   Chief Complaint Chief Complaint  Patient presents with  . Cough  . Abdominal Pain    HPI Martin Crawford is a 41 y.o. male.  HPI   Patient is a 41 year old male with a history of asthma and low back pain presenting for 2 weeks of a nonproductive cough. Patient reports that he had worsening low back pain prior to the cough beginning and cough has exacerbated his back pain. Patient additionally reports that he developed pain down his left flank that occurs when coughing. Patient reports that pain is a 9 out of 10 when coughing. Patient reports that he attempted trying Azo and hydration in case it was a urinary source, with no relief. Shortness of breath associated with coughing. Patient has not had fever or chills. No upper respiratory symptoms.Patient denies chest pain, dizziness, or lightheadedness. Patient denies any abdominal pain at rest, nausea, vomiting, or diarrhea. Bowel movements have been normal without melena or hematochezia. Patient denies any radiation of the pain in his back. Patient denies any numbness or tingling, weakness of lower extremities, saddle anesthesia, no urinary retention or loss of bowel or bladder control.Patient denies any recent sick contacts.  Past Medical History:  Diagnosis Date  . Asthma   . Chronic back pain     Patient Active Problem List   Diagnosis Date Noted  . Pilonidal abscess 09/22/2016  . Abscess and cellulitis of gluteal region 09/17/2016  . Sepsis (HCC) 09/17/2016  . Tobacco abuse 09/17/2016  . Chronic back pain   . Asthma     Past Surgical History:  Procedure Laterality Date  . ABSCESS DRAINAGE     pilonidal abscess  . INCISION AND DRAINAGE ABSCESS N/A 09/23/2016   Procedure: INCISION AND DRAINAGE ABSCESS PILONIDAL ABCESS;  Surgeon: Glenna Fellows, MD;  Location: [facility];  Service: General;  Laterality: N/A;       Home  Medications    Prior to Admission medications   Medication Sig Start Date End Date Taking? Authorizing Provider  acetaminophen (TYLENOL) 500 MG tablet Take 2,000 mg by mouth every 6 (six) hours as needed for moderate pain.   Yes [provider]  HYDROcodone-acetaminophen (NORCO/VICODIN) 5-325 MG tablet Take 1 tablet by mouth every 4 (four) hours as needed. Patient not taking: Reported on 01/14/2017 12/16/16   Dorena Bodo, NP    Family History Family History  Problem Relation Age of Onset  . Stroke Mother   . Hypertension Mother   . Asthma Father   . Hypertension Father     Social History Social History  Substance Use Topics  . Smoking status: Current Every Day Smoker    Packs/day: 1.00    Years: 28.00    Types: Cigarettes  . Smokeless tobacco: Never Used  . Alcohol use No     Allergies   Patient has no known allergies.   Review of Systems Review of Systems  Constitutional: Negative for chills and fever.  HENT: Negative for congestion, rhinorrhea, sinus pain and sore throat.   Eyes: Negative for visual disturbance.  Respiratory: Positive for cough and shortness of breath. Negative for chest tightness.   Cardiovascular: Negative for chest pain, palpitations and leg swelling.  Gastrointestinal: Negative for abdominal pain, blood in stool, constipation, diarrhea, nausea and vomiting.  Genitourinary: Positive for flank pain. Negative for dysuria.  Musculoskeletal: Positive for back pain and myalgias.  Skin: Negative  for rash.  Neurological: Negative for dizziness, light-headedness and headaches.     Physical Exam Updated Vital Signs BP (!) 141/88 (BP Location: Right Arm)   Pulse (!) 107   Temp 98 F (36.7 C) (Oral)   Resp 17   Ht  (1.753 m)   Wt 104.8 kg (231 lb)   SpO2 98%   BMI 34.11 kg/m   Physical Exam  Constitutional: He appears well-developed and well-nourished. No distress.  HENT:  Head: Normocephalic and atraumatic.  Mouth/Throat:  Oropharynx is clear and moist.  Eyes: Pupils are equal, round, and reactive to light. Conjunctivae and EOM are normal.  Neck: Normal range of motion. Neck supple.  Cardiovascular: Normal rate, regular rhythm, S1 normal and S2 normal.   No murmur heard. Pulmonary/Chest: Effort normal and breath sounds normal. He has no wheezes. He has no rales.  Abdominal: Soft. He exhibits no distension. There is tenderness. There is no guarding.  Tenderness to left flank to left lateral abdomen.   Musculoskeletal: Normal range of motion. He exhibits no edema or deformity.  Neurological: He is alert.  Cranial nerves grossly intact. Patient moves extremities with good coordination and without difficulty.  Skin: Skin is warm and dry. No rash noted. No erythema.  Psychiatric: He has a normal mood and affect. His behavior is normal. Judgment and thought content normal.  Nursing note and vitals reviewed.    ED Treatments / Results  Labs (all labs ordered are listed, but only abnormal results are displayed) Labs Reviewed  COMPREHENSIVE METABOLIC PANEL - Abnormal; Notable for the following:       Result Value   Glucose, Bld 153 (*)    All other components within normal limits  CBC WITH DIFFERENTIAL/PLATELET - Abnormal; Notable for the following:    WBC 16.8 (*)    RBC 7.79 (*)    Hemoglobin 17.1 (*)    MCV 60.3 (*)    MCH 22.0 (*)    MCHC 36.4 (*)    RDW 16.0 (*)    Neutro Abs 11.1 (*)    Monocytes Absolute 1.5 (*)    All other components within normal limits  URINALYSIS, ROUTINE W REFLEX MICROSCOPIC    EKG  EKG Interpretation None       Radiology Dg Chest 2 View  Result Date: 01/14/2017 CLINICAL DATA:  Productive cough for 2 weeks EXAM: CHEST  2 VIEW COMPARISON:  April 19, 2015 FINDINGS: The heart size and mediastinal contours are within normal limits. There is no focal infiltrate, pulmonary edema, or pleural effusion. The visualized skeletal structures are unremarkable. IMPRESSION: No  active cardiopulmonary disease. Electronically Signed   By: Sherian Rein M.D.   On: 01/14/2017 11:41    Procedures Procedures (including critical care time)  Medications Ordered in ED Medications  sodium chloride 0.9 % bolus 1,000 mL (not administered)  morphine 4 MG/ML injection 4 mg (not administered)  albuterol (PROVENTIL) (2.5 MG/3ML) 0.083% nebulizer solution 5 mg (5 mg Nebulization Given 01/14/17 1940)  ipratropium (ATROVENT) nebulizer solution 0.5 mg (0.5 mg Nebulization Given 01/14/17 1940)     Initial Impression / Assessment and Plan / ED Course  I have reviewed the triage vital signs and the nursing notes.  Pertinent labs & imaging results that were available during my care of the patient were reviewed by me and considered in my medical decision making (see chart for details).  Clinical Course as of Jan 16 44  Wed Jan 14, 2017  1933 Patient seen and evaluated.  Patient well appearing at this time and in no acute distress. Will proceed with pain control.   [AM]  1950 Case discussed with Dr. Carmell Austria. Will proceed with CT scan of abdomen/pelvis at this time due to localization in LLQ.  [AM]  2130 Patient seen and reevaluated. Patient reports he continues to have some left-sided discomfort. Patient was informed of his CT results and that it was reassuring.  [AM]    Clinical Course User Index [AM] Elisha Ponder, PA-C    Final Clinical Impressions(s) / ED Diagnoses   Final diagnoses:  None   MDM  Patient is a 41 year old male with a history of asthma and low back pain presenting for 2 weeks of a nonproductive cough. Patient is nontoxic-appearing and afebrile in the emergency department today. Patient's pain and other symptoms adequately managed in emergency department.  Fluid bolus given.  Labs, imaging and vitals reviewed. Patient's white blood cell count elevated to 16.8. This is likely due to recent respiratory illness. Patient does not meet the SIRS or Sepsis  criteria.  CT scan of the abdomen and pelvis is unremarkable for any acute inflammatory process to explain symptoms. On repeat exam patient does not have a surgical abdomen and there are no peritoneal signs.  No further indication to suggest appendicitis, bowel obstruction, bowel perforation, cholecystitis, diverticulitis, acute mesenteric ischemia. I have also discussed reasons to return immediately to the ED.  Patient expresses understanding and agrees with plan.  Patient shortness of breath is likely due to bronchitis. Patient demonstrates no respiratory distress in the emergency department today. Patient's lung exam demonstrates no wheeze or rales. Chest x-ray clear of any infiltrates or bronchitic thickening. Will discharge patient with Robitussin and Tessalon Perles for expectoration and cough suppression. Patient and wife were requesting possible stronger pain medication for patient's symptoms. I discussed that narcotic pain medication is typically not prescribed for the symptoms per department practice. Patient and his wife are in understanding and agree with the plan of care. Return precautions given for any worsening chest pain, shortness of breath, worsening cough, fever, or chills, or cough productive of blood or sputum. Resources for establishing care at [facility] and wellness or other options for establishing primary care.  This is a shared visit with Dr. Carmell Austria. Patient was independently evaluated by this attending physician. Attending physician consulted in evaluation and discharge management.  New Prescriptions New Prescriptions   No medications on file     Delia Chimes 01/15/17 0113    Benjiman Core, MD 01/15/17 1501

## 2017-01-14 NOTE — Discharge Instructions (Signed)
Please see the information and instructions below regarding your visit.  Your diagnoses today include:  1. Cough    Your testing was reassuring today that there was no no inflammation of the abdominal organs causing your abdominal pain. Your pain is likely exacerbated by your coughing.  Tests performed today include: See side panel of your discharge paperwork for testing performed today. Vital signs are listed at the bottom of these instructions.  -CT scan of the abdomen -Blood counts. Your white blood cell count was elevated today, suggestive of a respiratory infection such as bronchitis.  -Electrolytes and kidney function were normal today.  Medications prescribed:    Take any prescribed medications only as prescribed, and any over the counter medications only as directed on the packaging.  -Robitussin. This will help with cough and bringing any phlegm up out of your lungs. -Tessalon perles. This medication well help reduce your coughing.  Home care instructions:  Please follow any educational materials contained in this packet.   Please make sure you stay hydrated so that your urine is light yellow in color.  Follow-up instructions: Please follow-up with your primary care provider in as soon as possible for further evaluation of your symptoms if they are not completely improved. I listed [facility] as a resource.   Return instructions:  Please return to the Emergency Department if you experience worsening symptoms.  Please return if you have worsening pain,fever or chills, cough that brings up blood, difficulty breathing, chest pain, or nausea or vomiting or you cannot keep down food or liquid. Please return if you have any other emergent concerns.  Additional Information: Please try to reduce your smoking while you're recovering from this cough.  Your vital signs today were: BP 107/69 (BP Location: Left Arm)    Pulse (!) 102    Temp 98 F (36.7 C) (Oral)     Resp 18    Ht  (1.753 m)    Wt 104.8 kg (231 lb)    SpO2 95%    BMI 34.11 kg/m  If your blood pressure (BP) was elevated on multiple readings during this visit above 130 for the top number or above 80 for the bottom number, please have this repeated by your primary care provider within one month. --------------  Thank you for allowing [place] to participate in your care today.

## 2017-01-14 NOTE — ED Notes (Signed)
Discharge instructions reviewed with patient. Patient verbalizes understanding. VSS.   

## 2017-01-14 NOTE — ED Notes (Signed)
No response when called from lobby for VS recheck.

## 2017-01-14 NOTE — ED Triage Notes (Signed)
Pt complaint of continues nonproductive cough and LLQ pain for 2 weeks. Pt denies CP, SOB, n/v/d, or GU symptoms.

## 2017-02-11 ENCOUNTER — Ambulatory Visit (HOSPITAL_COMMUNITY)
Admission: EM | Admit: 2017-02-11 | Discharge: 2017-02-11 | Disposition: A | Discharge status: Home / Self Care | Payer: Self-pay | Attending: Family Medicine | Admitting: Family Medicine

## 2017-02-11 ENCOUNTER — Encounter (HOSPITAL_COMMUNITY): Payer: Self-pay | Admitting: Family Medicine

## 2017-02-11 DIAGNOSIS — S93492A Sprain of other ligament of left ankle, initial encounter: Secondary | ICD-10-CM

## 2017-02-11 MED ORDER — DICLOFENAC SODIUM 75 MG PO TBEC: 75.0000 mg | DELAYED_RELEASE_TABLET | Freq: Two times a day (BID) | ORAL | 0 refills | Status: DC

## 2017-02-11 NOTE — ED Triage Notes (Signed)
Pt here for twisting left ankle last night.

## 2017-02-11 NOTE — ED Provider Notes (Signed)
  [facility]   [id number] 02/11/17 Arrival Time: 1109  ASSESSMENT & PLAN:  1. Sprain of anterior talofibular ligament of left ankle, initial encounter     Meds ordered this encounter  Medications  . diclofenac (VOLTAREN) 75 MG EC tablet    Sig: Take 1 tablet (75 mg total) by mouth 2 (two) times daily.    Dispense:  14 tablet    Refill:  0   ASO. Written instructions regarding treatment of ankle sprain given. Will f/u if not showing significant improvement over the next week.  Reviewed expectations re: course of current medical issues. Questions answered. Outlined signs and symptoms indicating need for more acute intervention. Patient verbalized understanding. After Visit Summary given.   SUBJECTIVE:  Carlisle BeersJames D Nusser is a 41 y.o. male who reports:  Ankle Pain: Patient complains of left ankle pain. Onset of the symptoms was yesterday. Inciting event: inverted while walking down stairs. Current symptoms include ability to bear weight, but with some pain and stiffness. Aggravating symptoms: walking. Patient's overall course: stable. Patient has had no prior ankle problems. Previous visits for this problem: none.  Evaluation to date: none.  Treatment to date: none. No OTC analgesics. Able to bear weight immediately after injury. No extremity sensation changes or weakness.   ROS: As per HPI.   OBJECTIVE:  Vitals:   02/11/17 1149  BP: 136/89  Pulse: 63  Resp: 18  Temp: 98.4 F (36.9 C)  SpO2: 98%    General appearance: alert; no distress Extremities: no cyanosis or edema; symmetrical with no gross deformities; tenderness over L ankle laterally (ATFL distribution); no posterior malleolar tenderness; no 5th metatarsal tenderness; minimal swelling; no bruising or erythema; FROM of ankle; able to bear weight and walk in a flip-flop CV: normal extremity capillary refill Skin: warm and dry Neurologic: normal gait; normal symmetric reflexes in all extremities; normal  sensation Psychological: alert and cooperative; normal mood and affect   No Known Allergies  Past Medical History:  Diagnosis Date  . Asthma   . Chronic back pain    Social History   Social History  . Marital status: Married    Spouse name: N/A  . Number of children: N/A  . Years of education: N/A   Occupational History  . Not on file.   Social History Main Topics  . Smoking status: Current Every Day Smoker    Packs/day: 1.00    Years: 28.00    Types: Cigarettes  . Smokeless tobacco: Never Used  . Alcohol use No  . Drug use: No  . Sexual activity: Not on file   Other Topics Concern  . Not on file   Social History Narrative  . No narrative on file   Family History  Problem Relation Age of Onset  . Stroke Mother   . Hypertension Mother   . Asthma Father   . Hypertension Father    Past Surgical History:  Procedure Laterality Date  . ABSCESS DRAINAGE     pilonidal abscess  . INCISION AND DRAINAGE ABSCESS N/A 09/23/2016   Procedure: INCISION AND DRAINAGE ABSCESS PILONIDAL ABCESS;  Surgeon: Glenna FellowsHoxworth, Benjamin, MD;  Location: [facility];  Service: General;  Laterality: Vertis KelchN/A;     Yaslin Kirtley, MD 02/11/17 1214

## 2017-12-24 ENCOUNTER — Encounter (HOSPITAL_COMMUNITY): Payer: Self-pay

## 2017-12-24 ENCOUNTER — Emergency Department (HOSPITAL_COMMUNITY)
Admission: EM | Admit: 2017-12-24 | Discharge: 2017-12-24 | Disposition: A | Discharge status: Home / Self Care | Payer: Self-pay | Attending: Emergency Medicine | Admitting: Emergency Medicine

## 2017-12-24 ENCOUNTER — Other Ambulatory Visit: Payer: Self-pay

## 2017-12-24 DIAGNOSIS — F17210 Nicotine dependence, cigarettes, uncomplicated: Secondary | ICD-10-CM | POA: Insufficient documentation

## 2017-12-24 DIAGNOSIS — Z79899 Other long term (current) drug therapy: Secondary | ICD-10-CM | POA: Insufficient documentation

## 2017-12-24 DIAGNOSIS — M545 Low back pain, unspecified: Secondary | ICD-10-CM

## 2017-12-24 DIAGNOSIS — J45909 Unspecified asthma, uncomplicated: Secondary | ICD-10-CM | POA: Insufficient documentation

## 2017-12-24 MED ORDER — CYCLOBENZAPRINE HCL 10 MG PO TABS
10.0000 mg | ORAL_TABLET | Freq: Once | ORAL | Status: AC
  Administered 2017-12-24: 10 mg via ORAL
  Filled 2017-12-24: qty 1

## 2017-12-24 MED ORDER — NAPROXEN 500 MG PO TABS: 500.0000 mg | ORAL_TABLET | Freq: Two times a day (BID) | ORAL | 0 refills | Status: DC

## 2017-12-24 MED ORDER — CYCLOBENZAPRINE HCL 10 MG PO TABS: 10.0000 mg | ORAL_TABLET | Freq: Two times a day (BID) | ORAL | 0 refills | Status: DC | PRN

## 2017-12-24 MED ORDER — NAPROXEN 500 MG PO TABS
500.0000 mg | ORAL_TABLET | Freq: Once | ORAL | Status: AC
  Administered 2017-12-24: 500 mg via ORAL
  Filled 2017-12-24: qty 1

## 2017-12-24 NOTE — ED Triage Notes (Signed)
Pt reports back pain x1 week. Denies problems urinating, hematuria, and  fall/injury.

## 2017-12-24 NOTE — ED Provider Notes (Signed)
[facility]-EMERGENCY DEPT Provider Note   CSN: [id number] Arrival date & time: 12/24/17  2118     History   Chief Complaint Chief Complaint  Patient presents with  . Back Pain    HPI Martin Crawford is a 42 y.o. male.  HPI Patient presents emergency room for evaluation of back pain.  Patient states his symptoms started about 1 week ago.  He has had some aching pain in this his lower back.  He does not recall any specific injuries.  No falls.  Patient denies any trouble with abdominal pain.  No dysuria or hematuria.  He denies any trouble with fevers or chills.  Certain positions and movements make the pain worse.  He is tried some over-the-counter aspirin medication without significant relief. last couple nights he has not been able to sleep well so he came to the ED Past Medical History:  Diagnosis Date  . Asthma   . Chronic back pain     Patient Active Problem List   Diagnosis Date Noted  . Pilonidal abscess 09/22/2016  . Abscess and cellulitis of gluteal region 09/17/2016  . Sepsis (HCC) 09/17/2016  . Tobacco abuse 09/17/2016  . Chronic back pain   . Asthma     Past Surgical History:  Procedure Laterality Date  . ABSCESS DRAINAGE     pilonidal abscess  . INCISION AND DRAINAGE ABSCESS N/A 09/23/2016   Procedure: INCISION AND DRAINAGE ABSCESS PILONIDAL ABCESS;  Surgeon: Glenna Fellows, MD;  Location: [facility];  Service: General;  Laterality: N/A;        Home Medications    Prior to Admission medications   Medication Sig Start Date End Date Taking? Authorizing Provider  acetaminophen (TYLENOL) 500 MG tablet Take 2,000 mg by mouth every 6 (six) hours as needed for moderate pain.    [provider]  benzonatate (TESSALON PERLES) 100 MG capsule Take 1 capsule (100 mg total) by mouth 3 (three) times daily as needed for cough. 01/14/17   Aviva Kluver B, PA-C  cyclobenzaprine (FLEXERIL) 10 MG tablet Take 1 tablet (10 mg total) by mouth 2  (two) times daily as needed for muscle spasms. 12/24/17   Linwood Dibbles, MD  diclofenac (VOLTAREN) 75 MG EC tablet Take 1 tablet (75 mg total) by mouth 2 (two) times daily. 02/11/17   Mardella Layman, MD  guaiFENesin (ROBITUSSIN) 100 MG/5ML SOLN Take 10 mLs (200 mg total) by mouth every 4 (four) hours as needed for cough or to loosen phlegm. 01/14/17   Aviva Kluver B, PA-C  HYDROcodone-acetaminophen (NORCO/VICODIN) 5-325 MG tablet Take 1 tablet by mouth every 4 (four) hours as needed. Patient not taking: Reported on 01/14/2017 12/16/16   Dorena Bodo, NP  naproxen (NAPROSYN) 500 MG tablet Take 1 tablet (500 mg total) by mouth 2 (two) times daily. 12/24/17   Linwood Dibbles, MD    Family History Family History  Problem Relation Age of Onset  . Stroke Mother   . Hypertension Mother   . Asthma Father   . Hypertension Father     Social History Social History   Tobacco Use  . Smoking status: Current Every Day Smoker    Packs/day: 1.00    Years: 28.00    Pack years: 28.00    Types: Cigarettes  . Smokeless tobacco: Never Used  Substance Use Topics  . Alcohol use: No  . Drug use: No     Allergies   Patient has no known allergies.   Review of Systems  Review of Systems  All other systems reviewed and are negative.    Physical Exam Updated Vital Signs BP (!) 138/93 (BP Location: Left Arm)   Pulse 90   Temp 98.4 F (36.9 C) (Oral)   Resp 16   Ht 1.753 m (5\' 9" )   Wt 110.2 kg   SpO2 94%   BMI 35.88 kg/m   Physical Exam  Constitutional: He appears well-developed and well-nourished. No distress.  HENT:  Head: Normocephalic and atraumatic.  Right Ear: External ear normal.  Left Ear: External ear normal.  Eyes: Conjunctivae are normal. Right eye exhibits no discharge. Left eye exhibits no discharge. No scleral icterus.  Neck: Neck supple. No tracheal deviation present.  Cardiovascular: Normal rate.  Pulmonary/Chest: Effort normal. No stridor. No respiratory distress.    Abdominal: He exhibits no distension.  Musculoskeletal: He exhibits no edema.  5 out of 5 strength bilateral lower extremities; mild tenderness palpation paraspinal muscles lumbar region, no signs of erythema or swelling  Neurological: He is alert. Cranial nerve deficit: no gross deficits.  Normal sensation extremities  Skin: Skin is warm and dry. No rash noted.  Psychiatric: He has a normal mood and affect.  Nursing note and vitals reviewed.    ED Treatments / Results    Procedures Procedures (including critical care time)  Medications Ordered in ED Medications  naproxen (NAPROSYN) tablet 500 mg (has no administration in time range)  cyclobenzaprine (FLEXERIL) tablet 10 mg (has no administration in time range)     Initial Impression / Assessment and Plan / ED Course  I have reviewed the triage vital signs and the nursing notes.  Pertinent labs & imaging results that were available during my care of the patient were reviewed by me and considered in my medical decision making (see chart for details).   Patient symptoms are consistent with a lumbar strain.  No signs to suggest infection.  No neuro symptoms to suggest radiculopathy.  Will dc home with nsaids and muscle relaxants.  Final Clinical Impressions(s) / ED Diagnoses   Final diagnoses:  Acute midline low back pain without sciatica    ED Discharge Orders         Ordered    naproxen (NAPROSYN) 500 MG tablet  2 times daily     12/24/17 2243    cyclobenzaprine (FLEXERIL) 10 MG tablet  2 times daily PRN     12/24/17 2243           Linwood Dibbles, MD 12/24/17 2258

## 2017-12-24 NOTE — Discharge Instructions (Addendum)
Take the medications as prescribed, return to the ED for fever, numbness, weakness, follow-up with a primary care doctor or urgent care doctor if the symptoms persist

## 2019-01-22 ENCOUNTER — Other Ambulatory Visit: Payer: Self-pay

## 2019-01-22 ENCOUNTER — Emergency Department (HOSPITAL_COMMUNITY)
Admission: EM | Admit: 2019-01-22 | Discharge: 2019-01-22 | Disposition: A | Discharge status: Home / Self Care | Payer: 59 | Attending: Emergency Medicine | Admitting: Emergency Medicine

## 2019-01-22 ENCOUNTER — Encounter (HOSPITAL_COMMUNITY): Payer: Self-pay | Admitting: *Deleted

## 2019-01-22 DIAGNOSIS — J45909 Unspecified asthma, uncomplicated: Secondary | ICD-10-CM | POA: Diagnosis not present

## 2019-01-22 DIAGNOSIS — F17210 Nicotine dependence, cigarettes, uncomplicated: Secondary | ICD-10-CM | POA: Insufficient documentation

## 2019-01-22 DIAGNOSIS — L0291 Cutaneous abscess, unspecified: Secondary | ICD-10-CM | POA: Diagnosis present

## 2019-01-22 DIAGNOSIS — L02811 Cutaneous abscess of head [any part, except face]: Secondary | ICD-10-CM | POA: Diagnosis not present

## 2019-01-22 MED ORDER — NAPROXEN 375 MG PO TABS: 375.0000 mg | ORAL_TABLET | Freq: Two times a day (BID) | ORAL | 0 refills | Status: DC

## 2019-01-22 MED ORDER — LIDOCAINE-EPINEPHRINE 2 %-1:100000 IJ SOLN
20.0000 mL | Freq: Once | INTRAMUSCULAR | Status: DC
Start: 2019-01-22 — End: 2019-01-22
  Filled 2019-01-22: qty 1

## 2019-01-22 MED ORDER — SULFAMETHOXAZOLE-TRIMETHOPRIM 800-160 MG PO TABS: 1.0000 | ORAL_TABLET | Freq: Two times a day (BID) | ORAL | 0 refills | Status: AC

## 2019-01-22 NOTE — ED Provider Notes (Signed)
[facility]-EMERGENCY DEPT Provider Note   CSN: [id number] Arrival date & time: 01/22/19  1525     History   Chief Complaint Chief Complaint  Patient presents with  . Abscess    post head    HPI Martin Crawford is a 43 y.o. male.     HPI   43 year old male presents with an abscess to the scalp.  He states a history of multiple abscesses.  He states for the last 2 days he has had pain and swelling of the posterior scalp.  He notes that it is very tender.  He denies any fevers, chills, nausea, vomiting.  Past Medical History:  Diagnosis Date  . Asthma   . Chronic back pain     Patient Active Problem List   Diagnosis Date Noted  . Pilonidal abscess 09/22/2016  . Abscess and cellulitis of gluteal region 09/17/2016  . Sepsis (HCC) 09/17/2016  . Tobacco abuse 09/17/2016  . Chronic back pain   . Asthma     Past Surgical History:  Procedure Laterality Date  . ABSCESS DRAINAGE     pilonidal abscess  . INCISION AND DRAINAGE ABSCESS N/A 09/23/2016   Procedure: INCISION AND DRAINAGE ABSCESS PILONIDAL ABCESS;  Surgeon: Glenna Fellows, MD;  Location: [facility]S;  Service: General;  Laterality: N/A;        Home Medications    Prior to Admission medications   Medication Sig Start Date End Date Taking? Authorizing Provider  acetaminophen (TYLENOL) 500 MG tablet Take 2,000 mg by mouth every 6 (six) hours as needed for moderate pain.    [provider]  benzonatate (TESSALON PERLES) 100 MG capsule Take 1 capsule (100 mg total) by mouth 3 (three) times daily as needed for cough. 01/14/17   Aviva Kluver B, PA-C  cyclobenzaprine (FLEXERIL) 10 MG tablet Take 1 tablet (10 mg total) by mouth 2 (two) times daily as needed for muscle spasms. 12/24/17   Linwood Dibbles, MD  diclofenac (VOLTAREN) 75 MG EC tablet Take 1 tablet (75 mg total) by mouth 2 (two) times daily. 02/11/17   Mardella Layman, MD  guaiFENesin (ROBITUSSIN) 100 MG/5ML SOLN Take 10 mLs (200 mg  total) by mouth every 4 (four) hours as needed for cough or to loosen phlegm. 01/14/17   Aviva Kluver B, PA-C  HYDROcodone-acetaminophen (NORCO/VICODIN) 5-325 MG tablet Take 1 tablet by mouth every 4 (four) hours as needed. Patient not taking: Reported on 01/14/2017 12/16/16   Dorena Bodo, NP  naproxen (NAPROSYN) 375 MG tablet Take 1 tablet (375 mg total) by mouth 2 (two) times daily. 01/22/19   Latyra Jaye S, PA-C  sulfamethoxazole-trimethoprim (BACTRIM DS) 800-160 MG tablet Take 1 tablet by mouth 2 (two) times daily for 7 days. 01/22/19 01/29/19  Clayborne Artist, PA-C    Family History Family History  Problem Relation Age of Onset  . Stroke Mother   . Hypertension Mother   . Asthma Father   . Hypertension Father     Social History Social History   Tobacco Use  . Smoking status: Current Every Day Smoker    Packs/day: 1.00    Years: 28.00    Pack years: 28.00    Types: Cigarettes  . Smokeless tobacco: Never Used  Substance Use Topics  . Alcohol use: No  . Drug use: No     Allergies   Patient has no known allergies.   Review of Systems Review of Systems  Constitutional: Negative for chills and fever.  Respiratory: Negative  for shortness of breath.   Cardiovascular: Negative for chest pain.  Gastrointestinal: Negative for abdominal pain, nausea and vomiting.  Skin: Positive for wound (posterior scalp).     Physical Exam Updated Vital Signs BP (!) 140/92 (BP Location: Left Arm)   Pulse (!) 113   Temp 98.8 F (37.1 C) (Oral)   Resp 18   SpO2 93%   Physical Exam Vitals signs and nursing note reviewed.  Constitutional:      Appearance: He is well-developed.  HENT:     Head: Normocephalic and atraumatic.  Eyes:     Conjunctiva/sclera: Conjunctivae normal.  Neck:     Musculoskeletal: Neck supple.  Cardiovascular:     Rate and Rhythm: Normal rate and regular rhythm.     Heart sounds: Normal heart sounds. No murmur.  Pulmonary:     Effort:  Pulmonary effort is normal. No respiratory distress.     Breath sounds: Normal breath sounds. No wheezing or rales.  Abdominal:     General: Bowel sounds are normal. There is no distension.     Palpations: Abdomen is soft.     Tenderness: There is no abdominal tenderness.  Musculoskeletal: Normal range of motion.        General: No tenderness or deformity.  Skin:    General: Skin is warm and dry.     Findings: Erythema present. No rash.       Neurological:     Mental Status: He is alert and oriented to person, place, and time.  Psychiatric:        Behavior: Behavior normal.      ED Treatments / Results  Labs (all labs ordered are listed, but only abnormal results are displayed) Labs Reviewed - No data to display  EKG None  Radiology No results found.  Procedures .Marland KitchenIncision and Drainage  Date/Time: 01/22/2019 6:51 PM Performed by: Etter Sjogren, PA-C Authorized by: Etter Sjogren, PA-C   Consent:    Consent obtained:  Verbal   Consent given by:  Patient   Risks discussed:  Bleeding, incomplete drainage, pain and damage to other organs   Alternatives discussed:  No treatment Universal protocol:    Procedure explained and questions answered to patient or proxy's satisfaction: yes     Relevant documents present and verified: yes     Test results available and properly labeled: yes     Imaging studies available: yes     Required blood products, implants, devices, and special equipment available: yes     Site/side marked: yes     Immediately prior to procedure a time out was called: yes     Patient identity confirmed:  Verbally with patient Location:    Type:  Abscess   Size:  3cm   Location:  Head   Head location:  Scalp Pre-procedure details:    Skin preparation:  Betadine Anesthesia (see MAR for exact dosages):    Anesthesia method:  Local infiltration   Local anesthetic:  Lidocaine 2% WITH epi Procedure type:    Complexity:  Complex Procedure  details:    Incision types:  Single straight   Incision depth:  Subcutaneous   Scalpel blade:  11   Wound management:  Probed and deloculated, irrigated with saline and extensive cleaning   Drainage:  Purulent   Drainage amount:  Moderate   Wound treatment:  Wound left open   Packing materials:  None Post-procedure details:    Patient tolerance of procedure:  Tolerated well, no immediate complications   (  including critical care time)  Medications Ordered in ED Medications  lidocaine-EPINEPHrine (XYLOCAINE W/EPI) 2 %-1:100000 (with pres) injection 20 mL (has no administration in time range)     Initial Impression / Assessment and Plan / ED Course  I have reviewed the triage vital signs and the nursing notes.  Pertinent labs & imaging results that were available during my care of the patient were reviewed by me and considered in my medical decision making (see chart for details).        Patient presents with abscess to the posterior scalp.  Abscess is very fluctuant with only minimal surrounding erythema, no induration.  Abscess drained by PA student Martie LeeSabrina.  A lot of pus was obtained.  Wound irrigated and probed.  Will place on antibiotics and NSAIDs.  Encourage close follow-up with primary care for wound check and given strict return precautions.  Patient ready and stable for discharge.   At this time there does not appear to be any evidence of an acute emergency medical condition and the patient appears stable for discharge with appropriate outpatient follow up.Diagnosis was discussed with patient who verbalizes understanding and is agreeable to discharge.  Final Clinical Impressions(s) / ED Diagnoses   Final diagnoses:  Abscess of scalp    ED Discharge Orders         Ordered    naproxen (NAPROSYN) 375 MG tablet  2 times daily     01/22/19 1842    sulfamethoxazole-trimethoprim (BACTRIM DS) 800-160 MG tablet  2 times daily     01/22/19 1842           Clayborne ArtistKendrick,  Gustie Bobb S, PA-C 01/22/19 2103    Loren RacerYelverton, David, MD 01/26/19 2117

## 2019-01-22 NOTE — ED Triage Notes (Signed)
Noticed abscess type area on post head that has increased over last 2 days.

## 2019-01-22 NOTE — Discharge Instructions (Addendum)
Take all Bactrim as prescribed.  Take naproxen as needed for pain.  Follow-up with your primary care provider in 2 days for continued evaluation and a wound recheck.  Return to the ED immediately for new or worsening symptoms or concerns, such as new or worsening pain, swelling or any concerns at all.

## 2019-02-08 DIAGNOSIS — L02811 Cutaneous abscess of head [any part, except face]: Secondary | ICD-10-CM | POA: Insufficient documentation

## 2019-02-13 DIAGNOSIS — M62830 Muscle spasm of back: Secondary | ICD-10-CM | POA: Insufficient documentation

## 2019-03-10 ENCOUNTER — Other Ambulatory Visit: Payer: Self-pay

## 2019-03-10 ENCOUNTER — Encounter: Payer: Self-pay | Admitting: Family Medicine

## 2019-03-10 ENCOUNTER — Ambulatory Visit: Payer: Self-pay | Attending: Family Medicine | Admitting: Family Medicine

## 2019-03-10 VITALS — BP 134/84 | HR 95 | Temp 98.1°F | Resp 18 | Ht 69.0 in | Wt 240.0 lb

## 2019-03-10 DIAGNOSIS — M62830 Muscle spasm of back: Secondary | ICD-10-CM

## 2019-03-10 DIAGNOSIS — R739 Hyperglycemia, unspecified: Secondary | ICD-10-CM

## 2019-03-10 DIAGNOSIS — R202 Paresthesia of skin: Secondary | ICD-10-CM

## 2019-03-10 DIAGNOSIS — L02811 Cutaneous abscess of head [any part, except face]: Secondary | ICD-10-CM

## 2019-03-10 DIAGNOSIS — E119 Type 2 diabetes mellitus without complications: Secondary | ICD-10-CM

## 2019-03-10 LAB — POCT GLYCOSYLATED HEMOGLOBIN (HGB A1C): Hemoglobin A1C: 6.9 % — AB (ref 4.0–5.6)

## 2019-03-10 MED ORDER — CYCLOBENZAPRINE HCL 10 MG PO TABS: 10.0000 mg | ORAL_TABLET | Freq: Two times a day (BID) | ORAL | 2 refills | Status: DC | PRN

## 2019-03-10 MED ORDER — METFORMIN HCL 500 MG PO TABS: 500.0000 mg | ORAL_TABLET | Freq: Two times a day (BID) | ORAL | 3 refills | Status: DC

## 2019-03-10 MED ORDER — AMOXICILLIN-POT CLAVULANATE 875-125 MG PO TABS: 1.0000 | ORAL_TABLET | Freq: Two times a day (BID) | ORAL | 1 refills | Status: DC

## 2019-03-10 MED FILL — CYCLOBENZAPRINE 10 MG TAB: 10 | 15 days supply | Qty: 30 | Fill #0

## 2019-03-10 MED FILL — metFORMIN HCL 500 MG TABS: 500 | 30 days supply | Qty: 60 | Fill #0

## 2019-03-10 MED FILL — AMOX-CLAV 875-125 MG TABLET: 875-125 | 7 days supply | Qty: 14 | Fill #0

## 2019-03-10 NOTE — Progress Notes (Signed)
New Patient Office Visit  Subjective:  Patient ID: JOB HOLTSCLAW, male    DOB: Feb 13, 1976  Age: 43 y.o. MRN: [id number]  CC:  Chief Complaint  Patient presents with  . Establish Care    HPI Martin Crawford, 43 yo male who presents to establish care. He is status post ED visit on 01/22/2019 at [facility] ED for scalp abscess.  Patient and wife reports that patient has had issues with recurrent abscesses on different parts of the body in the past.  He reports that he felt better after the abscess was opened and drained during the emergency department visit.  He is starting to develop some pressure sensation at the site of the prior abscess and believes that he may be getting another abscess.  He did complete antibiotic therapy prescribed at his last ED visit.  Current discomfort ranges from about a 4-6 on a 0-to-10 scale.  Pain is increased if he touches/places pressure on the area of the left posterior scalp where he feels that he is developing another abscess.  He denies any fever or chills, no loss of appetite.  No nausea or vomiting.        He reports medical history significant for asthma but he has had no issues with shortness of breath, cough or wheezing since childhood.  He has had no issues with asthma as an adult.  He does have some issues with recurrent back pain and muscle spasm.  He denies any past diagnosis of diabetes.  He is unaware of elevated blood sugars on prior blood work.  He has some occasional increased thirst but denies urinary frequency or blurred vision. He has had an occasional sensation of numbness in his feet usually just on one side.  Past Medical History:  Diagnosis Date  . Asthma   . Chronic back pain     Past Surgical History:  Procedure Laterality Date  . ABSCESS DRAINAGE     pilonidal abscess  . INCISION AND DRAINAGE ABSCESS N/A 09/23/2016   Procedure: INCISION AND DRAINAGE ABSCESS PILONIDAL ABCESS;  Surgeon: Glenna Fellows, MD;  Location: [facility];   Service: General;  Laterality: N/A;    Family History  Problem Relation Age of Onset  . Stroke Mother   . Hypertension Mother   . Asthma Father   . Hypertension Father     Social History   Socioeconomic History  . Marital status: Married    Spouse name: Not on file  . Number of children: Not on file  . Years of education: Not on file  . Highest education level: Not on file  Occupational History  . Not on file  Social Needs  . Financial resource strain: Not on file  . Food insecurity    Worry: Not on file    Inability: Not on file  . Transportation needs    Medical: Not on file    Non-medical: Not on file  Tobacco Use  . Smoking status: Current Every Day Smoker    Packs/day: 1.00    Years: 28.00    Pack years: 28.00    Types: Cigarettes  . Smokeless tobacco: Never Used  Substance and Sexual Activity  . Alcohol use: No  . Drug use: No  . Sexual activity: Not on file  Lifestyle  . Physical activity    Days per week: Not on file    Minutes per session: Not on file  . Stress: Not on file  Relationships  . Social connections  Talks on phone: Not on file    Gets together: Not on file    Attends religious service: Not on file    Active member of club or organization: Not on file    Attends meetings of clubs or organizations: Not on file    Relationship status: Not on file  . Intimate partner violence    Fear of current or ex partner: Not on file    Emotionally abused: Not on file    Physically abused: Not on file    Forced sexual activity: Not on file  Other Topics Concern  . Not on file  Social History Narrative  . Not on file    ROS Review of Systems  Constitutional: Negative for chills, fatigue and fever.  HENT: Negative for sore throat and trouble swallowing.   Eyes: Negative for photophobia and visual disturbance.  Respiratory: Negative for cough and shortness of breath.   Cardiovascular: Negative for chest pain, palpitations and leg swelling.   Gastrointestinal: Negative for abdominal pain, blood in stool, constipation, diarrhea and nausea.  Endocrine: Negative for cold intolerance, heat intolerance, polydipsia, polyphagia and polyuria.  Genitourinary: Negative for dysuria and frequency.  Musculoskeletal: Positive for back pain.  Skin:       Recurrent skin abscesses  Neurological: Negative for dizziness and headaches.  Hematological: Negative for adenopathy. Does not bruise/bleed easily.    Objective:   Today's Vitals: BP 134/84 (BP Location: Right Arm, Patient Position: Sitting, Cuff Size: Large)   Pulse 95   Temp 98.1 F (36.7 C) (Oral)   Resp 18   Ht  (1.753 m)   Wt 240 lb (108.9 kg)   SpO2 96%   BMI 35.44 kg/m   Physical Exam Vitals and nursing note reviewed.  Constitutional:      General: He is not in acute distress.    Appearance: Normal appearance. He is obese. He is not ill-appearing.  Neck:     Vascular: No carotid bruit.  Cardiovascular:     Rate and Rhythm: Normal rate and regular rhythm.  Pulmonary:     Effort: Pulmonary effort is normal.     Breath sounds: Normal breath sounds.  Abdominal:     Palpations: Abdomen is soft.     Tenderness: There is no abdominal tenderness. There is no right CVA tenderness, left CVA tenderness, guarding or rebound.  Musculoskeletal:        General: Tenderness (Mild lumbosacral discomfort to palpation and mild thoracolumbar paraspinous spasm) present.     Cervical back: Normal range of motion and neck supple. No rigidity or tenderness.     Right lower leg: No edema.     Left lower leg: No edema.  Lymphadenopathy:     Cervical: Cervical adenopathy (left upper posterior cervical chain enlarged, non-tender lymph node) present.  Skin:    Comments: Patient was slightly tender, slightly fluctuant area in the posterior mid to left scalp  Neurological:     General: No focal deficit present.     Mental Status: He is alert and oriented to person, place, and time.   Psychiatric:        Mood and Affect: Mood normal.        Behavior: Behavior normal.     Assessment & Plan:  1. Elevated blood sugar Discussed with patient and wife that patient with elevated blood sugars on past blood work on review of chart.  Patient will have hemoglobin A1c and basic metabolic panel at today's visit as patient may  have increased risk of becoming diabetic or may be diabetic based on prior labs.  He also has diabetic risk factors of obesity and has had some occasional thirst in the past but denies any urinary frequency or blurred vision at this time.  Patient with glucose of 153 on blood work done on 01/14/2017. - [facility] Z6X - Basic Metabolic Panel  2. Paresthesia Patient reports occasional abnormal sensation/numbness in the feet and patient will have hemoglobin A1c and BMP to look for elevated blood sugar or electrolyte abnormality.  Patient also reports some issues with back pain which could cause radicular type paresthesias/numbness.  3. New onset type 2 diabetes mellitus ([place]) Patient with abnormal hemoglobin A1c at today's visit of 6.9 consistent with diabetes diagnosis.  Discussed low carbohydrate diet and the need for regular exercise and weight loss.  Patient will be started on Metformin 500 mg twice daily.  He will be referred to the clinical pharmacist for further information regarding diabetic diet/medications and blood sugar monitoring.  He declines glucometer prescription at today's visit. - Amb Referral to Clinical Pharmacist - metFORMIN (GLUCOPHAGE) 500 MG tablet; Take 1 tablet (500 mg total) by mouth 2 (two) times daily with a meal.  Dispense: 180 tablet; Refill: 3  4. Abscess of scalp Patient appears to be developing a new abscess of the scalp.  Prescription provided for Augmentin 875 mg twice daily.  He is also encouraged to use warm moist compresses to the area.  Discussed with the patient that his insulin resistance/diabetes and elevated blood sugars may be  contributing to his recurrent abscesses.  Patient should call or return sooner if he has worsening of the abscess or go to the emergency department if acute worsening with increased pain. - amoxicillin-clavulanate (AUGMENTIN) 875-125 MG tablet; Take 1 tablet by mouth 2 (two) times daily. For skin infection; take after eating  Dispense: 14 tablet; Refill: 1  5. Muscle spasm of back He reports that he has taken Flexeril in the past to help with muscle spasms as the back and new prescription provided at today's visit. - cyclobenzaprine (FLEXERIL) 10 MG tablet; Take 1 tablet (10 mg total) by mouth 2 (two) times daily as needed for muscle spasms.  Dispense: 30 tablet; Refill: 2   Outpatient Encounter Medications as of 03/10/2019  Medication Sig  . acetaminophen (TYLENOL) 500 MG tablet Take 2,000 mg by mouth every 6 (six) hours as needed for moderate pain.  . cyclobenzaprine (FLEXERIL) 10 MG tablet Take 1 tablet (10 mg total) by mouth 2 (two) times daily as needed for muscle spasms.  . [DISCONTINUED] benzonatate (TESSALON PERLES) 100 MG capsule Take 1 capsule (100 mg total) by mouth 3 (three) times daily as needed for cough.  . [DISCONTINUED] diclofenac (VOLTAREN) 75 MG EC tablet Take 1 tablet (75 mg total) by mouth 2 (two) times daily.  . [DISCONTINUED] guaiFENesin (ROBITUSSIN) 100 MG/5ML SOLN Take 10 mLs (200 mg total) by mouth every 4 (four) hours as needed for cough or to loosen phlegm.  . [DISCONTINUED] HYDROcodone-acetaminophen (NORCO/VICODIN) 5-325 MG tablet Take 1 tablet by mouth every 4 (four) hours as needed. (Patient not taking: Reported on 01/14/2017)  . [DISCONTINUED] naproxen (NAPROSYN) 375 MG tablet Take 1 tablet (375 mg total) by mouth 2 (two) times daily.   No facility-administered encounter medications on file as of 03/10/2019.     An After Visit Summary was printed and given to the patient.   Follow-up: Return for scalp abscess: f/u in 1-2 weeks if not better;  3-4 weeks f/u [facility]  for DM.    Cain Saupe, MD

## 2019-03-10 NOTE — Patient Instructions (Signed)
Living With Diabetes Diabetes (type 1 diabetes mellitus or type 2 diabetes mellitus) is a condition in which the body does not have enough of a hormone called insulin, or the body does not respond properly to insulin. Normally, insulin allows sugars (glucose) to enter cells in the body. The cells use glucose for energy. With diabetes, extra glucose builds up in the blood instead of going into cells, which results in high blood glucose (hyperglycemia). How to manage lifestyle changes Managing diabetes includes medical treatments as well as lifestyle changes. If diabetes is not managed well, serious physical and emotional complications can occur. Taking good care of yourself means that you are responsible for:  Monitoring glucose regularly.  Eating a healthy diet.  Exercising regularly.  Meeting with health care providers.  Taking medicines as directed. Some people may feel a lot of stress about managing their diabetes. This is known as emotional distress, and it is very common. Living with diabetes can place you at risk for emotional distress, depression, or anxiety. These disorders can be confusing and can make diabetes management more difficult. How to recognize stress Emotional distress Symptoms of emotional distress include:  Anger about having a diagnosis of diabetes.  Fear or frustration about your diagnosis and the changes you need to make to manage the condition.  Being overly worried about the care that you need or the cost of the care that you need.  Feeling like you caused your condition by doing something wrong.  Fear of unpredictable situations, like low or high blood glucose.  Feeling judged by your health care providers.  Feeling very alone with the disease.  Getting too tired or worn out with the demands of daily care. Depression Having diabetes means that you are at a higher risk for depression. Having depression also means that you are at a higher risk for  diabetes. Your health care provider may test (screen) you for symptoms of depression. It is important to recognize depression symptoms and to start treatment for depression soon after it is diagnosed. The following are some symptoms of depression:  Loss of interest in things that you used to enjoy.  Trouble sleeping, or often waking up early and not being able to get back to sleep.  A change in appetite.  Feeling tired most of the day.  Feeling nervous and anxious.  Feeling guilty and worrying that you are a burden to others.  Feeling depressed more often than you do not feel that way.  Thoughts of hurting yourself or feeling that you want to die. If you have any of these symptoms for 2 weeks or longer, reach out to a health care provider. Follow these instructions at home: Managing emotional distress The following are some ways to manage emotional distress:  Talk with your health care provider or certified diabetes educator. Consider working with a counselor or therapist.  Learn as much as you can about diabetes and its treatment. Meet with a certified diabetes educator or take a class to learn how to manage your condition.  Keep a journal of your thoughts and concerns.  Accept that some things are out of your control.  Talk with other people who have diabetes. It can help to talk with others about the emotional distress that you feel.  Find ways to manage stress that work for you. These may include art or music therapy, exercise, meditation, and hobbies.  Seek support from spiritual leaders, family, and friends. General instructions  Follow your diabetes management plan.    Keep all follow-up visits as told by your health care provider. This is important. Where to find support   Ask your health care provider to recommend a therapist who understands both depression and diabetes.  Search for information and support from the American Diabetes Association:  www.diabetes.org  Find a certified diabetes educator and make an appointment through [place] [facility] Diabetes Educators: www.diabeteseducator.org Get help right away if:  You have thoughts about hurting yourself or others. If you ever feel like you may hurt yourself or others, or have thoughts about taking your own life, get help right away. You can go to your nearest emergency department or call:  Your local emergency services (911 in the [place]  A suicide crisis helpline, such as the [place] at 302-529-4814. This is open 24 hours a day. Summary  Diabetes (type 1 diabetes mellitus or type 2 diabetes mellitus) is a condition in which the body does not have enough of a hormone called insulin, or the body does not respond properly to insulin.  Living with diabetes puts you at risk for medical issues, and it also puts you at risk for emotional issues such as emotional distress, depression, and anxiety.  Recognizing the symptoms of emotional distress and depression may help you avoid problems with your diabetes control. It is important to start treatment for emotional distress and depression soon after they are diagnosed.  Having diabetes means that you are at a higher risk for depression. Ask your health care provider to recommend a therapist who understands both depression and diabetes.  If you experience symptoms of emotional distress or depression, it is important to discuss this with your health care provider, certified diabetes educator, or therapist. This information is not intended to replace advice given to you by your health care provider. Make sure you discuss any questions you have with your health care provider. Document Released: 08/21/2016 Document Revised: 04/19/2018 Document Reviewed: 08/21/2016 Elsevier Patient Education  2020 [employer] for Diabetes Mellitus, Adult  Carbohydrate counting is a method of  keeping track of how many carbohydrates you eat. Eating carbohydrates naturally increases the amount of sugar (glucose) in the blood. Counting how many carbohydrates you eat helps keep your blood glucose within normal limits, which helps you manage your diabetes (diabetes mellitus). It is important to know how many carbohydrates you can safely have in each meal. This is different for every person. A diet and nutrition specialist (registered dietitian) can help you make a meal plan and calculate how many carbohydrates you should have at each meal and snack. Carbohydrates are found in the following foods:  Grains, such as breads and cereals.  Dried beans and soy products.  Starchy vegetables, such as potatoes, peas, and corn.  Fruit and fruit juices.  Milk and yogurt.  Sweets and snack foods, such as cake, cookies, candy, chips, and soft drinks. How do I count carbohydrates? There are two ways to count carbohydrates in food. You can use either of the methods or a combination of both. Reading "Nutrition Facts" on packaged food The "Nutrition Facts" list is included on the labels of almost all packaged foods and beverages in the [place] It includes:  The serving size.  Information about nutrients in each serving, including the grams (g) of carbohydrate per serving. To use the "Nutrition Facts":  Decide how many servings you will have.  Multiply the number of servings by the number of carbohydrates per serving.  The resulting number is  the total amount of carbohydrates that you will be having. Learning standard serving sizes of other foods When you eat carbohydrate foods that are not packaged or do not include "Nutrition Facts" on the label, you need to measure the servings in order to count the amount of carbohydrates:  Measure the foods that you will eat with a food scale or measuring cup, if needed.  Decide how many standard-size servings you will eat.  Multiply the number of servings  by 15. Most carbohydrate-rich foods have about 15 g of carbohydrates per serving. ? For example, if you eat 8 oz (170 g) of strawberries, you will have eaten 2 servings and 30 g of carbohydrates (2 servings x 15 g = 30 g).  For foods that have more than one food mixed, such as soups and casseroles, you must count the carbohydrates in each food that is included. The following list contains standard serving sizes of common carbohydrate-rich foods. Each of these servings has about 15 g of carbohydrates:   hamburger bun or  English muffin.   oz (15 mL) syrup.   oz (14 g) jelly.  1 slice of bread.  1 six-inch tortilla.  3 oz (85 g) cooked rice or pasta.  4 oz (113 g) cooked dried beans.  4 oz (113 g) starchy vegetable, such as peas, corn, or potatoes.  4 oz (113 g) hot cereal.  4 oz (113 g) mashed potatoes or  of a large baked potato.  4 oz (113 g) canned or frozen fruit.  4 oz (120 mL) fruit juice.  4-6 crackers.  6 chicken nuggets.  6 oz (170 g) unsweetened dry cereal.  6 oz (170 g) plain fat-free yogurt or yogurt sweetened with artificial sweeteners.  8 oz (240 mL) milk.  8 oz (170 g) fresh fruit or one small piece of fruit.  24 oz (680 g) popped popcorn. Example of carbohydrate counting Sample meal  3 oz (85 g) chicken breast.  6 oz (170 g) brown rice.  4 oz (113 g) corn.  8 oz (240 mL) milk.  8 oz (170 g) strawberries with sugar-free whipped topping. Carbohydrate calculation 1. Identify the foods that contain carbohydrates: ? Rice. ? Corn. ? Milk. ? Strawberries. 2. Calculate how many servings you have of each food: ? 2 servings rice. ? 1 serving corn. ? 1 serving milk. ? 1 serving strawberries. 3. Multiply each number of servings by 15 g: ? 2 servings rice x 15 g = 30 g. ? 1 serving corn x 15 g = 15 g. ? 1 serving milk x 15 g = 15 g. ? 1 serving strawberries x 15 g = 15 g. 4. Add together all of the amounts to find the total grams of  carbohydrates eaten: ? 30 g + 15 g + 15 g + 15 g = 75 g of carbohydrates total. Summary  Carbohydrate counting is a method of keeping track of how many carbohydrates you eat.  Eating carbohydrates naturally increases the amount of sugar (glucose) in the blood.  Counting how many carbohydrates you eat helps keep your blood glucose within normal limits, which helps you manage your diabetes.  A diet and nutrition specialist (registered dietitian) can help you make a meal plan and calculate how many carbohydrates you should have at each meal and snack. This information is not intended to replace advice given to you by your health care provider. Make sure you discuss any questions you have with your health care provider. Document  Released: 04/07/2005 Document Revised: 10/30/2016 Document Reviewed: 09/19/2015 Elsevier Patient Education  2020 [employer].

## 2019-03-10 NOTE — Progress Notes (Signed)
Patient complains of intermittent numbing in the right thigh for the past few years. Patient states the sensation comes at random time and he could be sitting or standing. Patient states there is no "pain" nor does the sensation radiates. He feels the sensation in the right side groin and thigh. Patient denies injury.

## 2019-03-11 LAB — BASIC METABOLIC PANEL WITH GFR
BUN/Creatinine Ratio: 11 (ref 9–20)
BUN: 11 mg/dL (ref 6–24)
CO2: 20 mmol/L (ref 20–29)
Calcium: 10 mg/dL (ref 8.7–10.2)
Chloride: 105 mmol/L (ref 96–106)
Creatinine, Ser: 0.96 mg/dL (ref 0.76–1.27)
GFR calc Af Amer: 112 mL/min/1.73
GFR calc non Af Amer: 97 mL/min/1.73
Glucose: 101 mg/dL — ABNORMAL HIGH (ref 65–99)
Potassium: 4.7 mmol/L (ref 3.5–5.2)
Sodium: 141 mmol/L (ref 134–144)

## 2019-03-14 ENCOUNTER — Telehealth: Payer: Self-pay | Admitting: *Deleted

## 2019-03-14 NOTE — Telephone Encounter (Signed)
Medical Assistant left message on patient's home and cell voicemail. Voicemail states to give a call back to Martin Crawford with [facility] at [phone].  

## 2019-03-25 ENCOUNTER — Encounter (HOSPITAL_COMMUNITY): Payer: Self-pay

## 2019-03-25 ENCOUNTER — Other Ambulatory Visit: Payer: Self-pay

## 2019-03-25 ENCOUNTER — Emergency Department (HOSPITAL_COMMUNITY)
Admission: EM | Admit: 2019-03-25 | Discharge: 2019-03-25 | Disposition: A | Discharge status: Home / Self Care | Payer: Self-pay | Attending: Emergency Medicine | Admitting: Emergency Medicine

## 2019-03-25 ENCOUNTER — Emergency Department (HOSPITAL_COMMUNITY): Payer: Self-pay

## 2019-03-25 DIAGNOSIS — J45909 Unspecified asthma, uncomplicated: Secondary | ICD-10-CM | POA: Insufficient documentation

## 2019-03-25 DIAGNOSIS — F17210 Nicotine dependence, cigarettes, uncomplicated: Secondary | ICD-10-CM | POA: Insufficient documentation

## 2019-03-25 DIAGNOSIS — Y998 Other external cause status: Secondary | ICD-10-CM | POA: Insufficient documentation

## 2019-03-25 DIAGNOSIS — W01190A Fall on same level from slipping, tripping and stumbling with subsequent striking against furniture, initial encounter: Secondary | ICD-10-CM | POA: Insufficient documentation

## 2019-03-25 DIAGNOSIS — R55 Syncope and collapse: Secondary | ICD-10-CM | POA: Insufficient documentation

## 2019-03-25 DIAGNOSIS — Y9389 Activity, other specified: Secondary | ICD-10-CM | POA: Insufficient documentation

## 2019-03-25 DIAGNOSIS — Y92013 Bedroom of single-family (private) house as the place of occurrence of the external cause: Secondary | ICD-10-CM | POA: Insufficient documentation

## 2019-03-25 DIAGNOSIS — S0990XA Unspecified injury of head, initial encounter: Secondary | ICD-10-CM | POA: Insufficient documentation

## 2019-03-25 DIAGNOSIS — Z7984 Long term (current) use of oral hypoglycemic drugs: Secondary | ICD-10-CM | POA: Insufficient documentation

## 2019-03-25 LAB — CBC WITH DIFFERENTIAL/PLATELET
Abs Immature Granulocytes: 0.05 10*3/uL (ref 0.00–0.07)
Basophils Absolute: 0.1 10*3/uL (ref 0.0–0.1)
Basophils Relative: 1 %
Eosinophils Absolute: 0.3 10*3/uL (ref 0.0–0.5)
Eosinophils Relative: 3 %
HCT: 48.3 % (ref 39.0–52.0)
Hemoglobin: 15.6 g/dL (ref 13.0–17.0)
Immature Granulocytes: 1 %
Lymphocytes Relative: 17 %
Lymphs Abs: 1.8 10*3/uL (ref 0.7–4.0)
MCH: 20.9 pg — ABNORMAL LOW (ref 26.0–34.0)
MCHC: 32.3 g/dL (ref 30.0–36.0)
MCV: 64.7 fL — ABNORMAL LOW (ref 80.0–100.0)
Monocytes Absolute: 1.3 10*3/uL — ABNORMAL HIGH (ref 0.1–1.0)
Monocytes Relative: 12 %
Neutro Abs: 7.1 10*3/uL (ref 1.7–7.7)
Neutrophils Relative %: 66 %
Platelets: 203 10*3/uL (ref 150–400)
RBC: 7.47 MIL/uL — ABNORMAL HIGH (ref 4.22–5.81)
RDW: 19.3 % — ABNORMAL HIGH (ref 11.5–15.5)
WBC: 10.6 10*3/uL — ABNORMAL HIGH (ref 4.0–10.5)
nRBC: 0 % (ref 0.0–0.2)

## 2019-03-25 LAB — BASIC METABOLIC PANEL
Anion gap: 9 (ref 5–15)
BUN: 8 mg/dL (ref 6–20)
CO2: 23 mmol/L (ref 22–32)
Calcium: 8.8 mg/dL — ABNORMAL LOW (ref 8.9–10.3)
Chloride: 106 mmol/L (ref 98–111)
Creatinine, Ser: 0.75 mg/dL (ref 0.61–1.24)
GFR calc Af Amer: >60 mL/min (ref >60)
GFR calc non Af Amer: >60 mL/min (ref >60)
Glucose, Bld: 167 mg/dL — ABNORMAL HIGH (ref 70–99)
Potassium: 3.9 mmol/L (ref 3.5–5.1)
Sodium: 138 mmol/L (ref 135–145)

## 2019-03-25 LAB — CBG MONITORING, ED: Glucose-Capillary: 116 mg/dL — ABNORMAL HIGH (ref 70–99)

## 2019-03-25 MED ORDER — HYDROCODONE-ACETAMINOPHEN 5-325 MG PO TABS
1.0000 | ORAL_TABLET | Freq: Once | ORAL | Status: AC
  Administered 2019-03-25: 10:00:00 1 via ORAL
  Filled 2019-03-25: qty 1

## 2019-03-25 MED ORDER — NAPROXEN 500 MG PO TABS: 500.0000 mg | ORAL_TABLET | Freq: Two times a day (BID) | ORAL | 0 refills | Status: DC | PRN

## 2019-03-25 NOTE — Discharge Instructions (Signed)
Please follow-up with your PCP regarding today's visit.  You may need to begin monitoring your blood sugars regularly in the event that this was a hypoglycemic event.  Please use more caution standing up from seated position.  Please take your medications, as prescribed.  Please discontinue if develop any dark black stools or abdominal discomfort.  Return to the ED or seek medical attention for any new or worsening symptoms.

## 2019-03-25 NOTE — ED Triage Notes (Signed)
Patient states he got out of bed and was stretching. Patient not sure if he passed out or not,but hit the back of his head on the foot of the bed. Patient states his wife had to help him off of the floor.

## 2019-03-25 NOTE — ED Provider Notes (Signed)
[facility]-EMERGENCY DEPT Provider Note   CSN: [id number] Arrival date & time: 03/25/19  7829     History   Chief Complaint Chief Complaint  Patient presents with  . Fall  . Head Injury    HPI Martin Crawford is a 43 y.o. male with no significant PMH presents to the ED after sustaining a head injury at home.  Patient reportedly was stretching immediately after standing up out of bed when he passed out backwards striking his head on the wooden foot of the bed.  Patient complains of 10 out of 10 worse headache of his life.  He has a 3 to 4 cm linear gash on posterior aspect of head, immediately inferior to an abscess that he has had repeatedly drained.  Patient is accompanied by his wife who tells me that he is fallen 3 times before, all from standing up too quickly from seated position.  He denies any palpitations, chest discomfort, or shortness of breath symptoms immediately prior to or following his fall.  His wife reports that his fall was significant and "shook the bed".  He was initially confused, but regained normal mental status after couple minutes.  She denies any extremity movements concerning for seizure and there was no incontinence or tongue biting.  He denies any current chest pain, difficulty breathing, neck discomfort, presyncopal prodrome, blurred vision, numbness, tingling, or other neurologic deficits.     HPI  Past Medical History:  Diagnosis Date  . Asthma   . Chronic back pain     Patient Active Problem List   Diagnosis Date Noted  . Pilonidal abscess 09/22/2016  . Abscess and cellulitis of gluteal region 09/17/2016  . Sepsis (HCC) 09/17/2016  . Tobacco abuse 09/17/2016  . Chronic back pain   . Asthma     Past Surgical History:  Procedure Laterality Date  . ABSCESS DRAINAGE     pilonidal abscess  . INCISION AND DRAINAGE ABSCESS N/A 09/23/2016   Procedure: INCISION AND DRAINAGE ABSCESS PILONIDAL ABCESS;  Surgeon: Glenna Fellows,  MD;  Location: [facility] ORS;  Service: General;  Laterality: N/A;        Home Medications    Prior to Admission medications   Medication Sig Start Date End Date Taking? Authorizing Provider  acetaminophen (TYLENOL) 500 MG tablet Take 2,000 mg by mouth every 6 (six) hours as needed for moderate pain.    [provider]  amoxicillin-clavulanate (AUGMENTIN) 875-125 MG tablet Take 1 tablet by mouth 2 (two) times daily. For skin infection; take after eating 03/10/19   Fulp, Cammie, MD  cyclobenzaprine (FLEXERIL) 10 MG tablet Take 1 tablet (10 mg total) by mouth 2 (two) times daily as needed for muscle spasms. 03/10/19   Fulp, Cammie, MD  metFORMIN (GLUCOPHAGE) 500 MG tablet Take 1 tablet (500 mg total) by mouth 2 (two) times daily with a meal. 03/10/19   Fulp, Cammie, MD  naproxen (NAPROSYN) 500 MG tablet Take 1 tablet (500 mg total) by mouth 2 (two) times daily between meals as needed for moderate pain or headache. 03/25/19   Lorelee New, PA-C    Family History Family History  Problem Relation Age of Onset  . Stroke Mother   . Hypertension Mother   . Asthma Father   . Hypertension Father     Social History Social History   Tobacco Use  . Smoking status: Current Every Day Smoker    Packs/day: 1.00    Years: 28.00    Pack years:  28.00    Types: Cigarettes  . Smokeless tobacco: Never Used  Substance Use Topics  . Alcohol use: No  . Drug use: No     Allergies   Patient has no known allergies.   Review of Systems Review of Systems  Respiratory: Negative for shortness of breath.   Cardiovascular: Negative for chest pain.  Musculoskeletal: Negative for gait problem and neck pain.  Skin: Positive for wound.  Neurological: Positive for headaches.     Physical Exam Updated Vital Signs BP 128/87   Pulse 70   Temp 98 F (36.7 C) (Oral)   Resp (!) 35   Ht 5\' 9"  (1.753 m)   Wt 108.9 kg   SpO2 100%   BMI 35.44 kg/m   Physical Exam Vitals signs and nursing  note reviewed. Exam conducted with a chaperone present.  Constitutional:      Appearance: Normal appearance.  HENT:     Head: Normocephalic and atraumatic.     Mouth/Throat:     Pharynx: Oropharynx is clear.  Eyes:     General: No scleral icterus.    Extraocular Movements: Extraocular movements intact.     Conjunctiva/sclera: Conjunctivae normal.     Pupils: Pupils are equal, round, and reactive to light.  Neck:     Musculoskeletal: Normal range of motion and neck supple. No neck rigidity.     Comments: No C-spine tenderness to palpation. Cardiovascular:     Rate and Rhythm: Normal rate and regular rhythm.     Pulses: Normal pulses.     Heart sounds: Normal heart sounds.  Pulmonary:     Effort: Pulmonary effort is normal. No respiratory distress.     Breath sounds: Normal breath sounds.  Skin:    General: Skin is dry.     Capillary Refill: Capillary refill takes less than 2 seconds.  Neurological:     Mental Status: He is alert.     GCS: GCS eye subscore is 4. GCS verbal subscore is 5. GCS motor subscore is 6.     Gait: Gait normal.     Comments: CN II through XII grossly intact.  ROM and sensation fully intact.  Smiles symmetrically.  PERRL and EOM intact.  No ataxic gait.  Psychiatric:        Mood and Affect: Mood normal.        Behavior: Behavior normal.        Thought Content: Thought content normal.        ED Treatments / Results  Labs (all labs ordered are listed, but only abnormal results are displayed) Labs Reviewed  BASIC METABOLIC PANEL - Abnormal; Notable for the following components:      Result Value   Glucose, Bld 167 (*)    Calcium 8.8 (*)    All other components within normal limits  CBC WITH DIFFERENTIAL/PLATELET - Abnormal; Notable for the following components:   WBC 10.6 (*)    RBC 7.47 (*)    MCV 64.7 (*)    MCH 20.9 (*)    RDW 19.3 (*)    Monocytes Absolute 1.3 (*)    All other components within normal limits  CBG MONITORING, ED -  Abnormal; Notable for the following components:   Glucose-Capillary 116 (*)    All other components within normal limits    EKG EKG Interpretation  Date/Time:  Friday March 25 2019 08:20:53 EST Ventricular Rate:  77 PR Interval:    QRS Duration: 97 QT Interval:  418 QTC Calculation:  474 R Axis:   91 Text Interpretation: Sinus rhythm Probable left atrial enlargement Borderline right axis deviation Although rate has decreased from last tracing 16 Sep 2016 Confirmed by Margarita Grizzleay, Danielle [phone]([id number]) on 03/25/2019 8:26:15 AM   Radiology Ct Head Wo Contrast  Result Date: 03/25/2019 CLINICAL DATA:  Trauma this morning hit back the head. EXAM: CT HEAD WITHOUT CONTRAST TECHNIQUE: Contiguous axial images were obtained from the base of the skull through the vertex without intravenous contrast. COMPARISON:  None. FINDINGS: Brain: No evidence of acute infarction, hemorrhage, hydrocephalus, extra-axial collection or mass lesion/mass effect. Vascular: No hyperdense vessel noted. Skull: Normal. Negative for fracture or focal lesion. Sinuses/Orbits: Orbits are normal. Left sphenoid sinus retention cyst is noted. Other: Occipital scalp swelling and hematoma are noted. IMPRESSION: No focal acute intracranial abnormality identified. Occipital scalp swelling and hematoma are noted. Electronically Signed   By: Sherian ReinWei-Chen  Lin M.D.   On: 03/25/2019 09:00    Procedures Procedures (including critical care time)  Medications Ordered in ED Medications  HYDROcodone-acetaminophen (NORCO/VICODIN) 5-325 MG per tablet 1 tablet (has no administration in time range)     Initial Impression / Assessment and Plan / ED Course  I have reviewed the triage vital signs and the nursing notes.  Pertinent labs & imaging results that were available during my care of the patient were reviewed by me and considered in my medical decision making (see chart for details).        Obtained noncontrast head CT to rule out intracranial  hemorrhage as he reported denied time worse headache of his life resulting from his fall.  Did not obtain C-spine CT given lack of bony tenderness to palpation and full ROM intact and without discomfort.  Results of head CT demonstrated no acute intracranial abnormalities.  Discussed case with Dr. Rosalia Hammersay who personally evaluated the patient and given proximity of wound to his scalp abscess, will not close with sutures.  Instead will irrigate thoroughly.  Given uncertainty surrounding what precipitated his fall, obtained EKG and basic labs.  He denies any presyncopal prodrome.  He was accompanied by his wife denied any seizure activity or postictal confusion.  Obtained orthostatic vital signs which were within normal limits.  No significant electrolyte abnormalities.  By hyperextending his neck during discharge, patient may have temporarily kinked carotid arteries and subsequently led to temporary cerebral insufficiency.  EKG is reviewed and similar to prior tracing.  No arrhythmia or ischemia noted.  Patient recently started Metformin for new diabetes and does not take his blood sugars regularly.  Encouraged him to do so moving forward.  Encouraged him to follow-up with PCP for ongoing evaluation and continued management.  Will prescribe naproxen outpatient for symptomatic relief of his head discomfort.  Encouraged him to follow-up with his PCP regarding today's visit.  All of the evaluation and work-up results were discussed with the patient and any family at bedside. They were provided opportunity to ask any additional questions and have none at this time. They have expressed understanding of verbal discharge instructions as well as return precautions and are agreeable to the plan.    Final Clinical Impressions(s) / ED Diagnoses   Final diagnoses:  Injury of head, initial encounter    ED Discharge Orders         Ordered    naproxen (NAPROSYN) 500 MG tablet  2 times daily between meals PRN     03/25/19  1000           Lorelee NewGreen, Andrya Roppolo L,  PA-C 03/25/19 1001    Margarita Grizzle, MD 03/25/19 [phone]

## 2019-03-31 ENCOUNTER — Ambulatory Visit: Payer: 59 | Attending: Family Medicine | Admitting: Family Medicine

## 2019-03-31 ENCOUNTER — Other Ambulatory Visit: Payer: Self-pay

## 2019-03-31 ENCOUNTER — Ambulatory Visit: Payer: 59 | Admitting: Pharmacist

## 2019-03-31 IMAGING — CT CT PELVIS W/ CM
2 of 6 series · 14 of 46 positions shown, 18 images · IV contrast (iopamidol)
Comparison: 08/02/2013 CT

CLINICAL DATA: Worsening pain in gluteal fold pain and swelling,
onset 1 week ago.

EXAM:
CT PELVIS WITH CONTRAST
TECHNIQUE: Multidetector CT imaging of the pelvis was performed using the
standard protocol following the bolus administration of intravenous
contrast.
CONTRAST:  75mL 6DLFXM-DXX IOPAMIDOL (6DLFXM-DXX) INJECTION 61%

[Series 2: pelvis with · axial · 0.74mm/px · z∈[+838,+1098]mm · 11 of 60 slices shown, 15 images]
[im 5/60  soft-tissue]
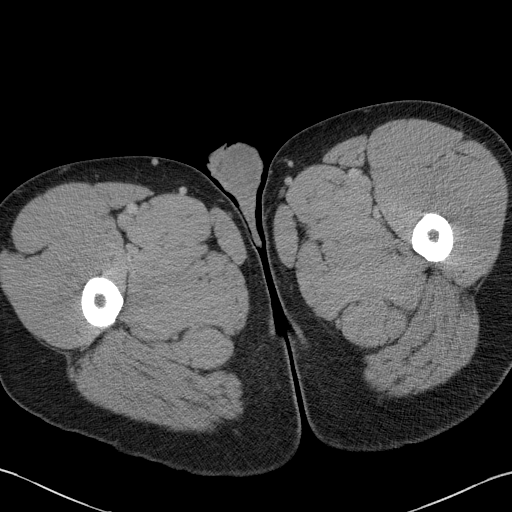
[im 5/60  bone]
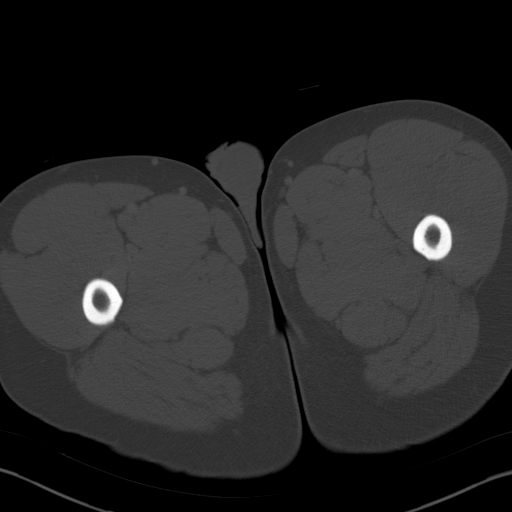
[im 12/60  soft-tissue]
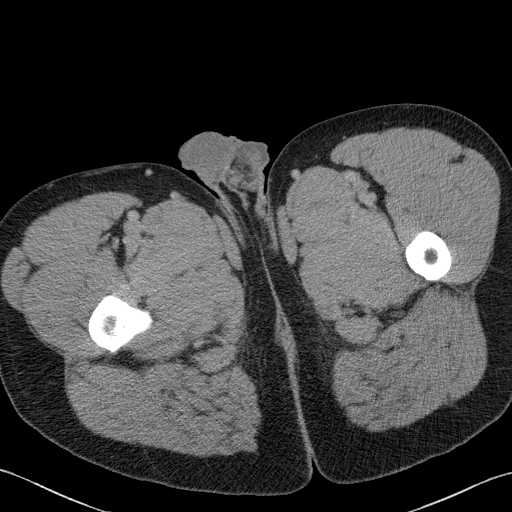
[im 17/60  soft-tissue]
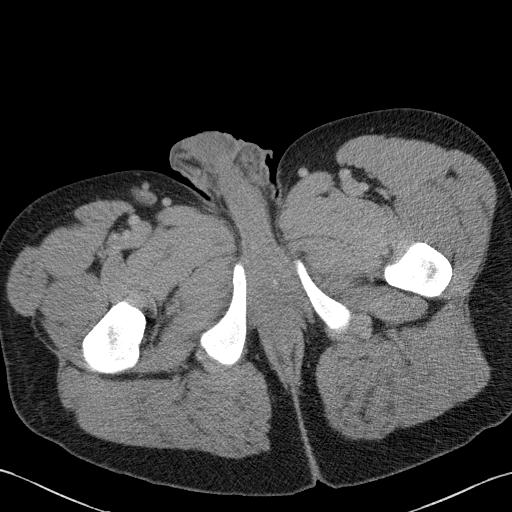
[im 24/60  soft-tissue]
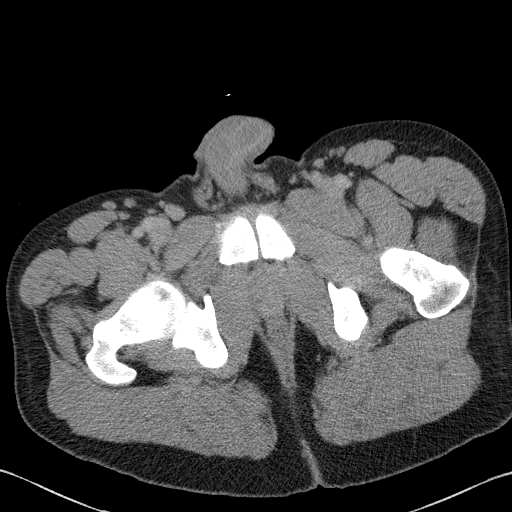
[im 31/60  soft-tissue]
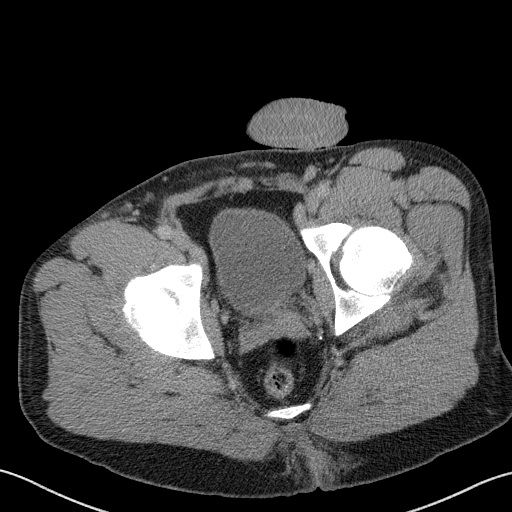
[im 36/60  soft-tissue]
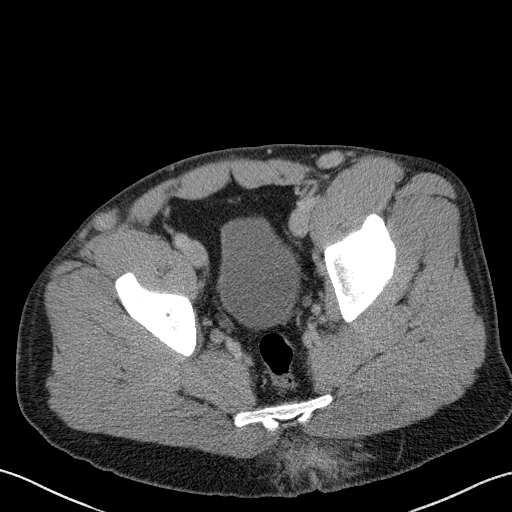
[im 43/60  soft-tissue]
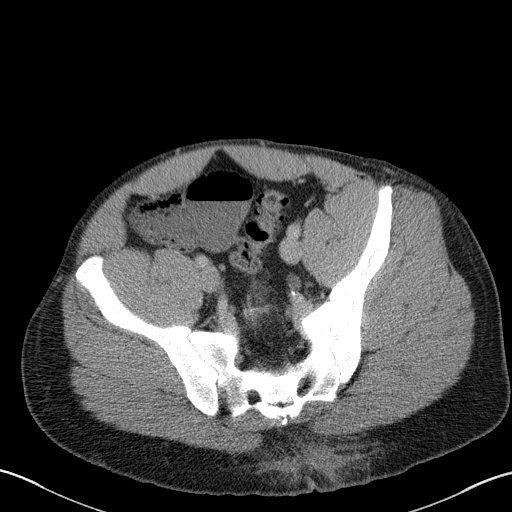
[im 50/60  soft-tissue]
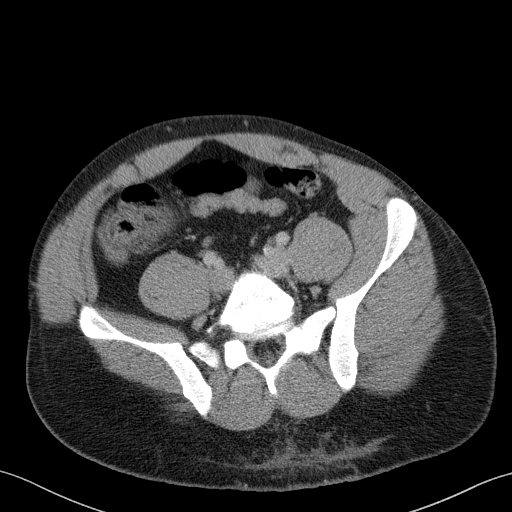
[im 50/60  lung]
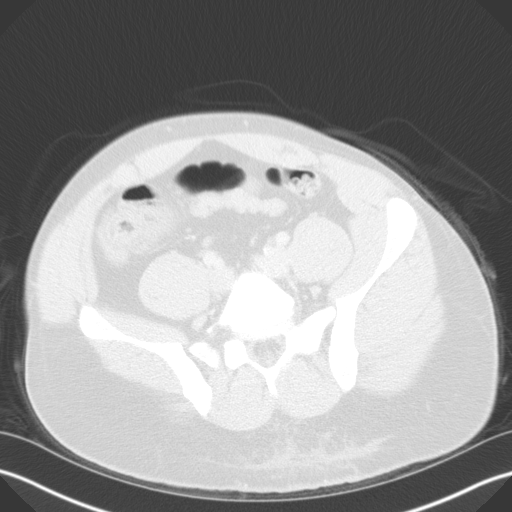
[im 52/60  lung]
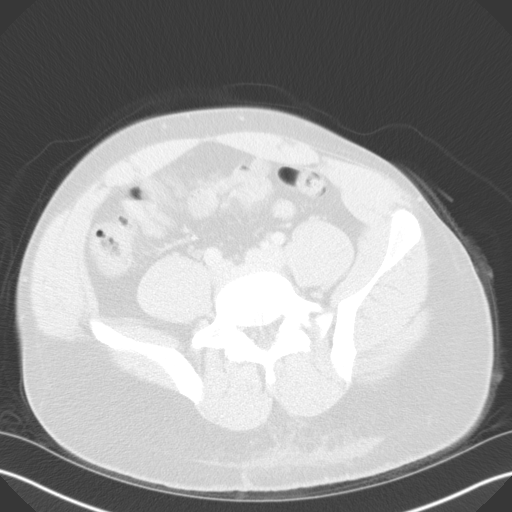
[im 55/60  soft-tissue]
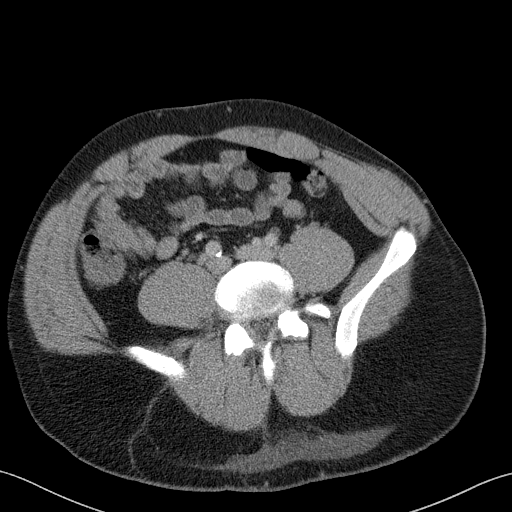
[im 55/60  lung]
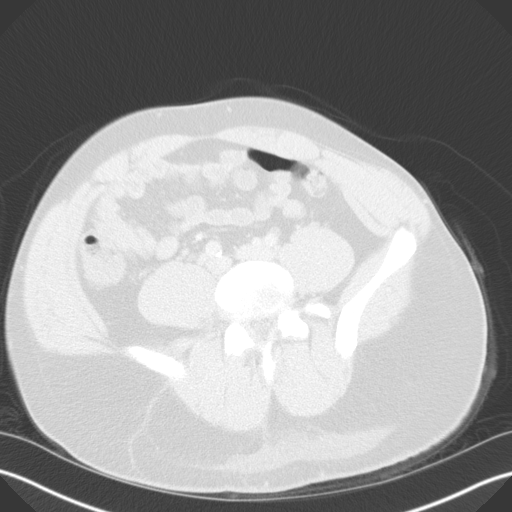
[im 55/60  bone]
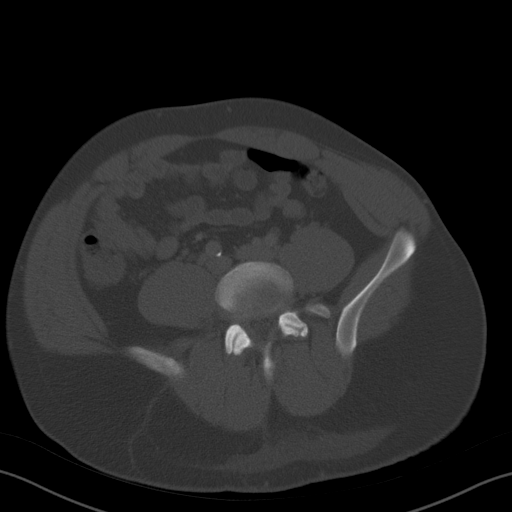
[im 57/60  lung]
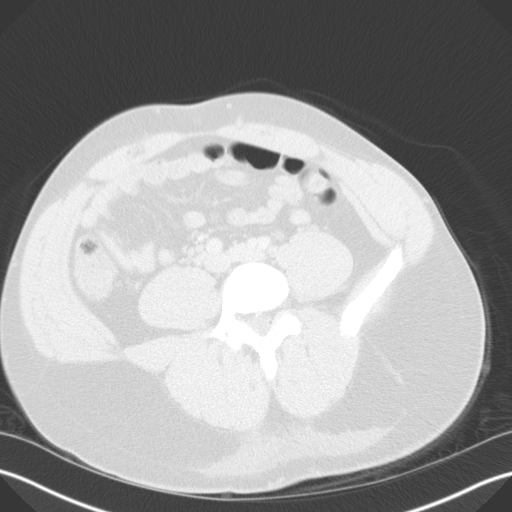

[Series 6: coronal images · coronal · 0.58mm/px · 3 of 146 slices shown]
[im 37/146  soft-tissue]
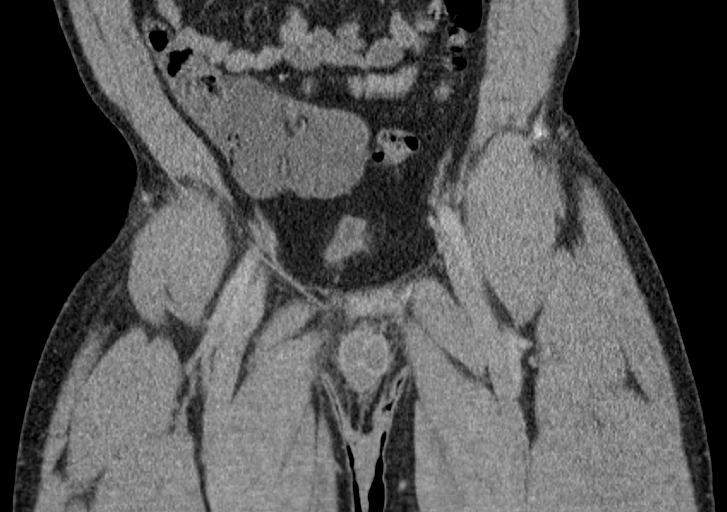
[im 73/146  soft-tissue]
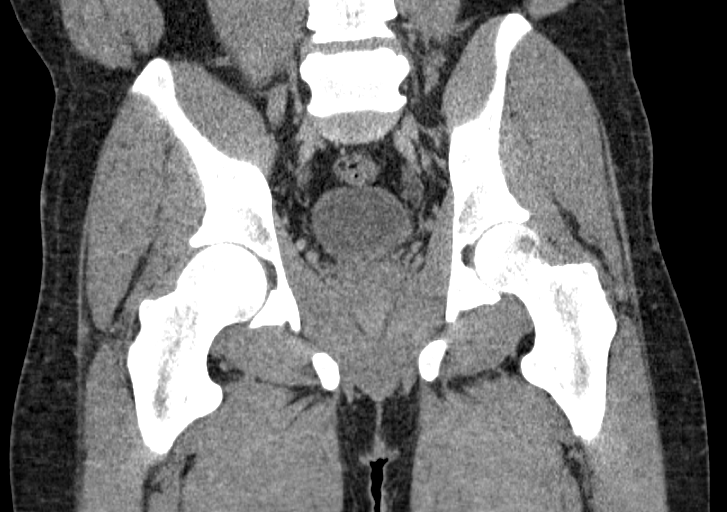
[im 109/146  soft-tissue]
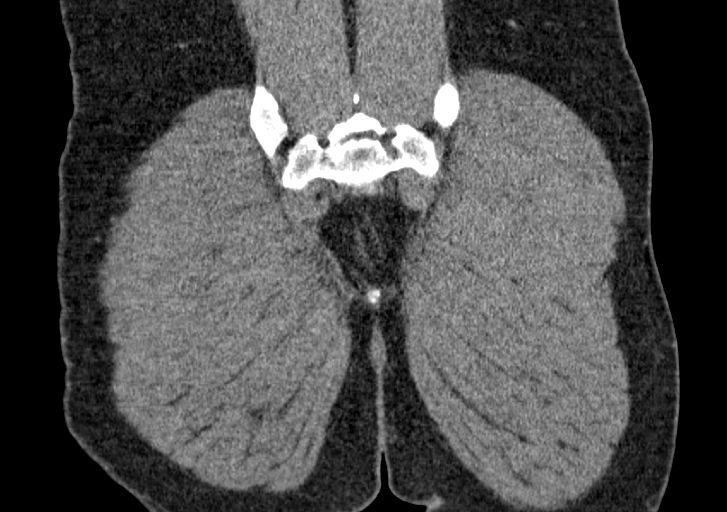

[14 of 46 positions shown; findings below may reference images not displayed]

FINDINGS: Urinary Tract: Mild ectasia of the distal left ureter without
obstructing mass or calculus. The urinary bladder is physiologically
distended.

Bowel: No acute bowel inflammation or obstruction. Normal-appearing
appendix.

Vascular/Lymphatic: Mild atherosclerotic vascular calcifications
along the visualized common iliac arteries and branch vessels. No
aneurysmal dilatation. No lymphadenopathy

Reproductive: Fat noted within the right inguinal canal. Normal size
prostate.

Other: Similar to prior exam, there is an inflammatory phlegmonous
mass within the subcutaneous soft tissues just deep to the [place]illano
cleft extending caudally along the left medial buttock and cephalad
to the small pullback, overlying the erector spinae muscles.
Potential small abscesses to the right and left of the [place]ano cleft
approximately 3 cm from the superior margin of the cleft measuring 5
x 1.1 cm.

Musculoskeletal: No acute fracture nor bone destruction.
IMPRESSION: Subcutaneous inflammatory phlegmonous masslike abnormality with
possible small drainable abscesses deep to the upper margin of the
C[place] cleft measuring 5 x 1.1 cm with edema along the small of the
back extending and overlying the visualized erector spinae muscles.

## 2019-04-06 IMAGING — CT CT PELVIS W/ CM
2 series · 16 of 42 positions shown, 19 images · IV contrast (iopamidol)
Comparison: 09/16/2016

CLINICAL DATA: Worsening pilonidal abscess.

EXAM:
CT PELVIS WITH CONTRAST
TECHNIQUE: Multidetector CT imaging of the pelvis was performed using the
standard protocol following the bolus administration of intravenous
contrast.
CONTRAST:  100mL ZASZP6-3ZZ IOPAMIDOL (ZASZP6-3ZZ) INJECTION 61%

[Series 2: pelvis with · axial · 0.81mm/px · z∈[-485,-225]mm · 13 of 58 slices shown, 16 images]
[im 4/58  soft-tissue]
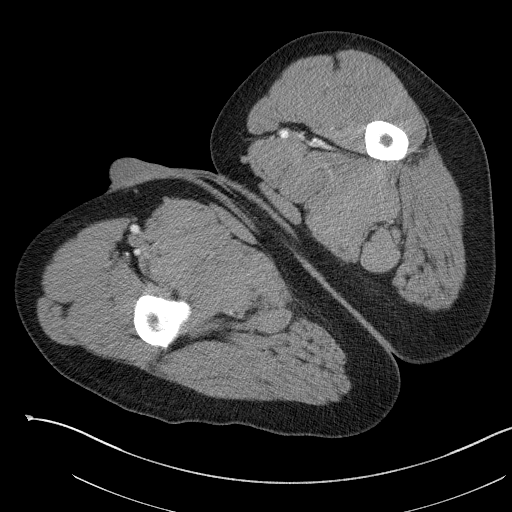
[im 4/58  bone]
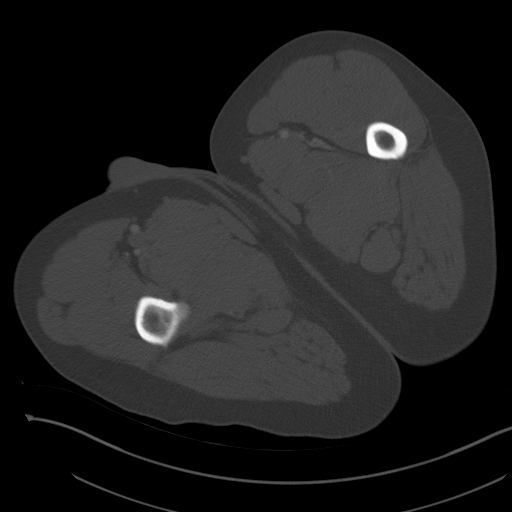
[im 10/58  soft-tissue]
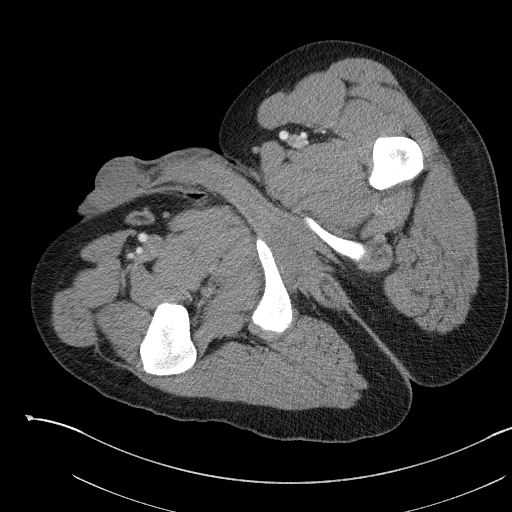
[im 15/58  soft-tissue]
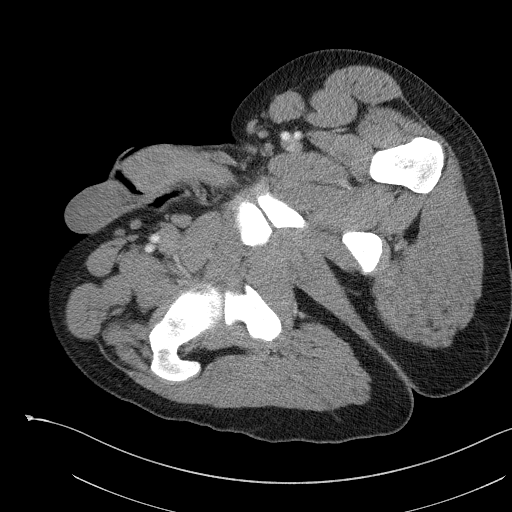
[im 21/58  soft-tissue]
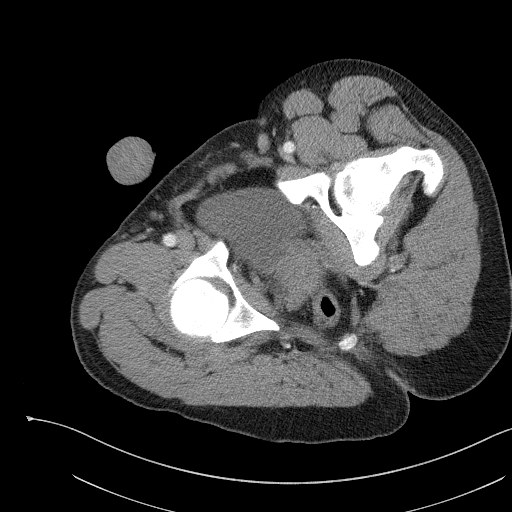
[im 26/58  soft-tissue]
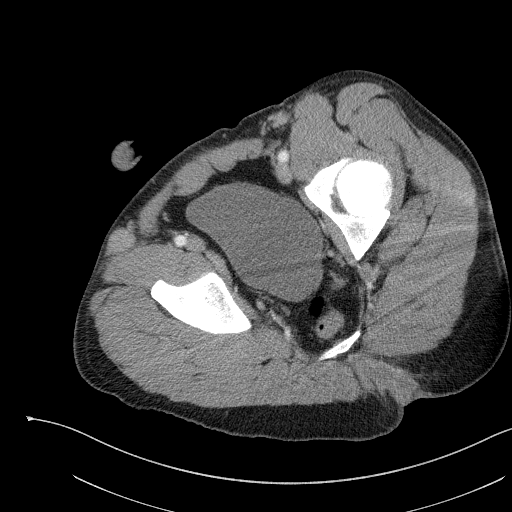
[im 32/58  soft-tissue]
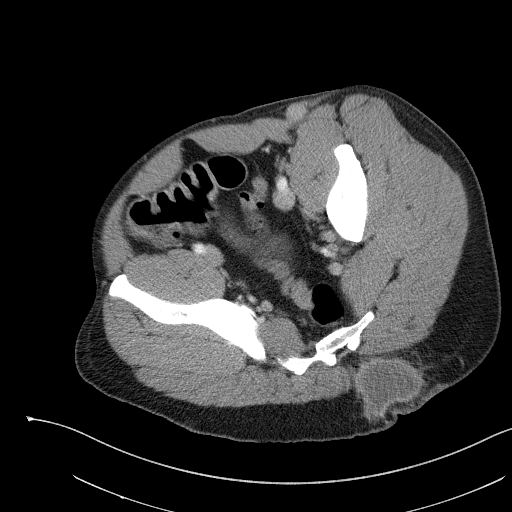
[im 37/58  soft-tissue]
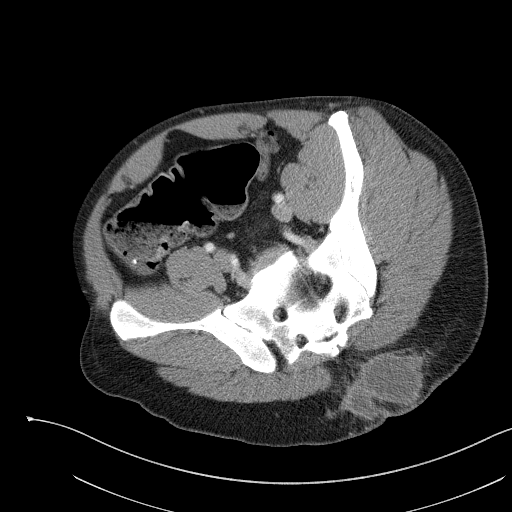
[im 43/58  soft-tissue]
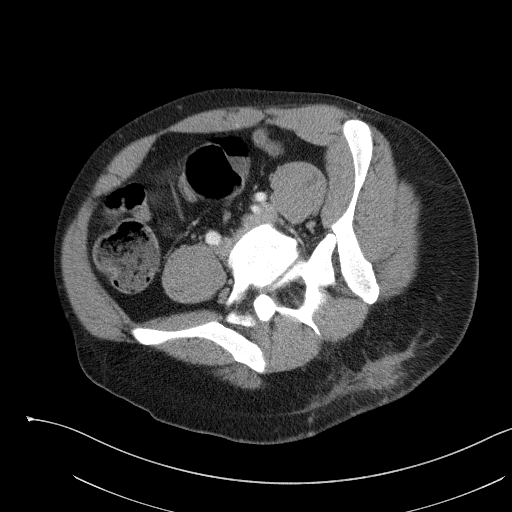
[im 48/58  soft-tissue]
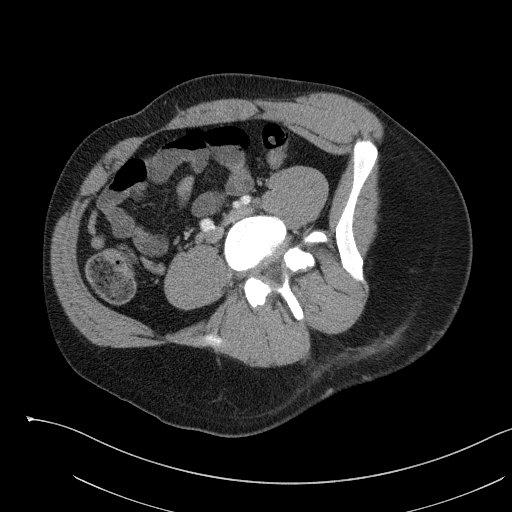
[im 48/58  bone]
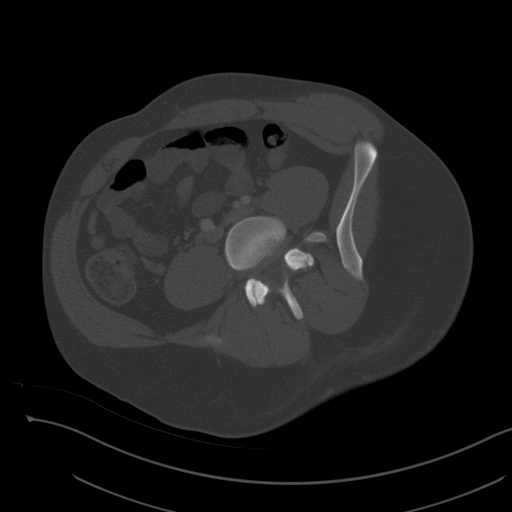
[im 50/58  lung]
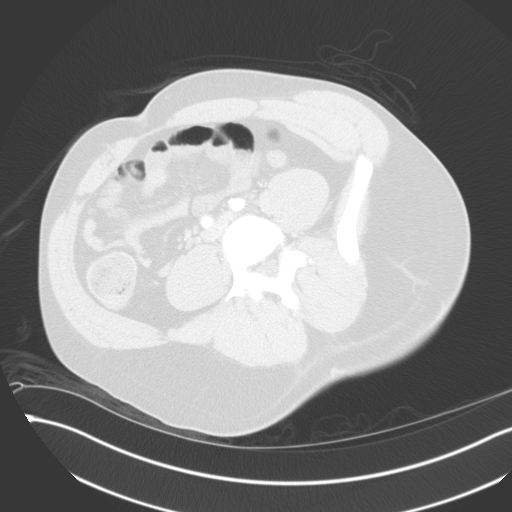
[im 52/58  lung]
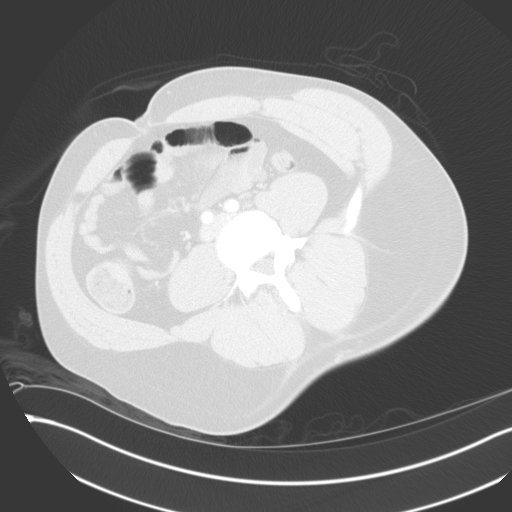
[im 54/58  soft-tissue]
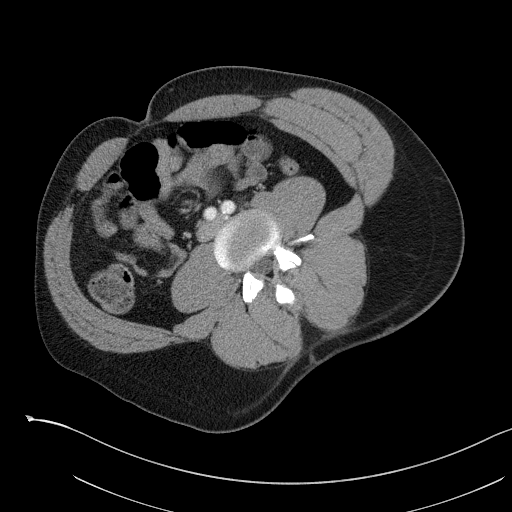
[im 54/58  lung]
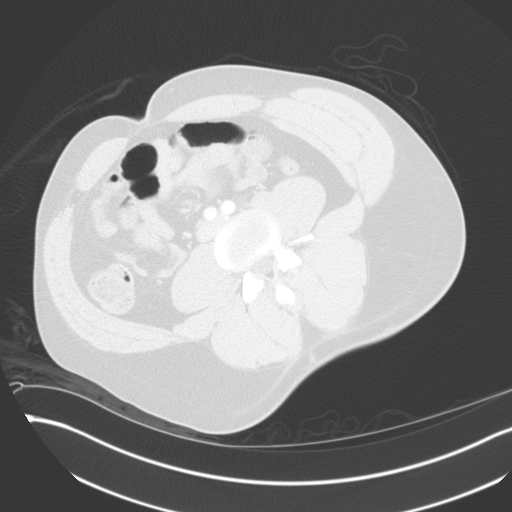
[im 56/58  lung]
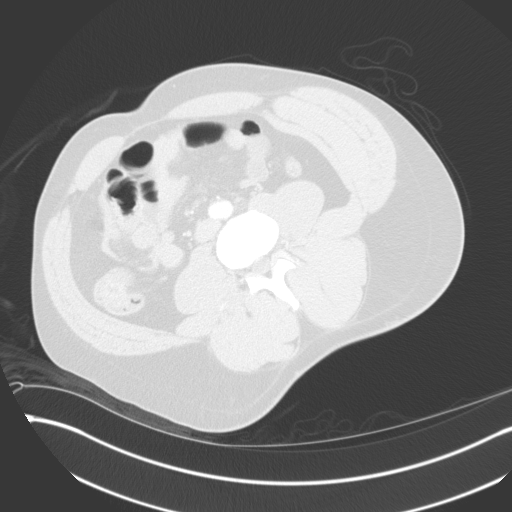

[Series 4: coronal images · coronal · 0.62mm/px · 3 of 170 slices shown]
[im 57/170  soft-tissue]
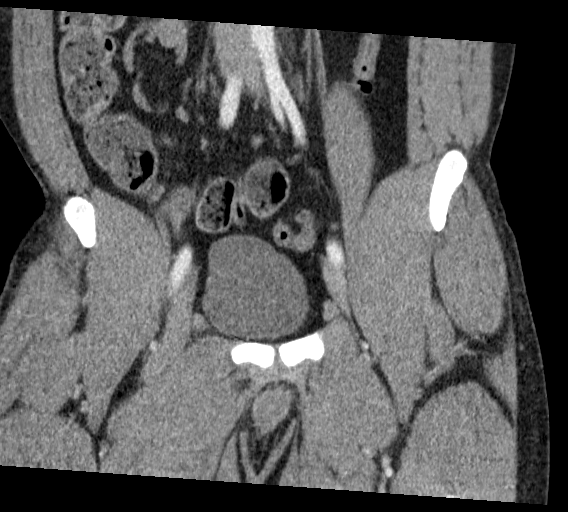
[im 76/170  soft-tissue]
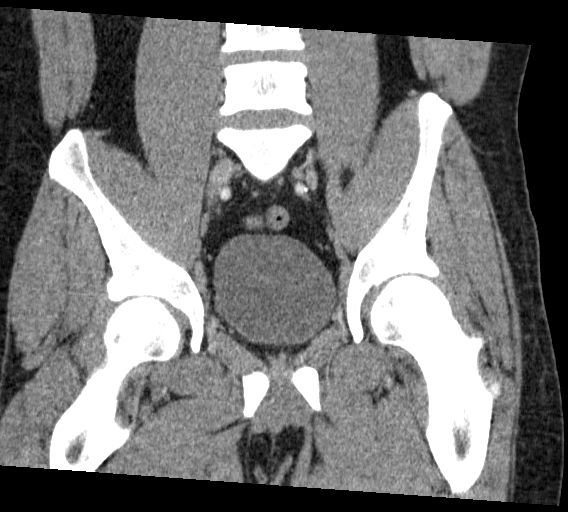
[im 94/170  soft-tissue]
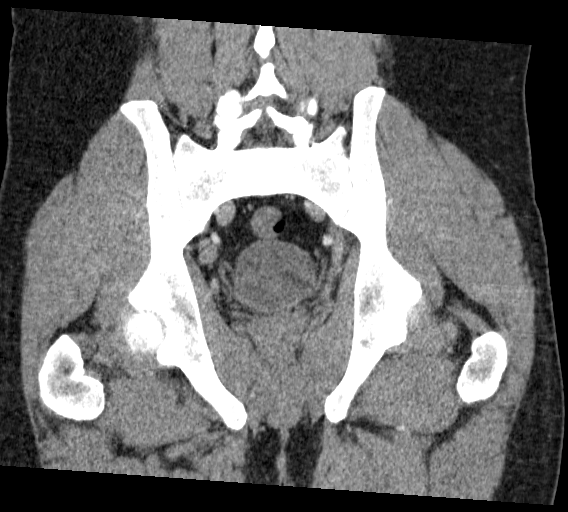

[16 of 42 positions shown; findings below may reference images not displayed]

FINDINGS: Urinary Tract:  No abnormality visualized.

Bowel:  Unremarkable visualized pelvic bowel loops.

Vascular/Lymphatic: No pathologically enlarged lymph nodes. No
significant vascular abnormality seen.

Reproductive:  No mass or other significant abnormality

Other: There has been interval increase of the size of the known
pilonidal abscess which now measures 5.4 by 4.1 by 10 cm. The
abscess is centered in the subcutaneous tissues with a preserved fat
plane to the muscular fascia. It however abuts the inferior-most
portion of the posterior coccyx. No evidence of lytic bony changes.

Musculoskeletal: No suspicious bone lesions identified.
IMPRESSION: Increase of the size of known pilonidal abscess which now measures
up to 10 cm in craniocaudal dimension.

No evidence of involvement of the underlying musculature, however no
fat plane is identified between the anterior portion of the abscess
and the tip of the coccyx. Attention on future follow-up is
recommended.

## 2019-04-21 ENCOUNTER — Encounter: Payer: Self-pay | Admitting: Family Medicine

## 2020-07-12 ENCOUNTER — Other Ambulatory Visit: Payer: Self-pay

## 2020-07-12 ENCOUNTER — Inpatient Hospital Stay (HOSPITAL_COMMUNITY)
Admission: EM | Admit: 2020-07-12 | Discharge: 2020-07-14 | DRG: 872 | Disposition: A | Discharge status: Home / Self Care | Payer: 59 | Attending: Internal Medicine | Admitting: Internal Medicine

## 2020-07-12 ENCOUNTER — Encounter (HOSPITAL_COMMUNITY): Payer: Self-pay

## 2020-07-12 DIAGNOSIS — A419 Sepsis, unspecified organism: Principal | ICD-10-CM | POA: Diagnosis present

## 2020-07-12 DIAGNOSIS — Z7984 Long term (current) use of oral hypoglycemic drugs: Secondary | ICD-10-CM

## 2020-07-12 DIAGNOSIS — Z720 Tobacco use: Secondary | ICD-10-CM | POA: Diagnosis present

## 2020-07-12 DIAGNOSIS — Z79899 Other long term (current) drug therapy: Secondary | ICD-10-CM

## 2020-07-12 DIAGNOSIS — L02215 Cutaneous abscess of perineum: Secondary | ICD-10-CM | POA: Diagnosis present

## 2020-07-12 DIAGNOSIS — Z6834 Body mass index (BMI) 34.0-34.9, adult: Secondary | ICD-10-CM

## 2020-07-12 DIAGNOSIS — E119 Type 2 diabetes mellitus without complications: Secondary | ICD-10-CM

## 2020-07-12 DIAGNOSIS — F17210 Nicotine dependence, cigarettes, uncomplicated: Secondary | ICD-10-CM | POA: Diagnosis present

## 2020-07-12 DIAGNOSIS — K921 Melena: Secondary | ICD-10-CM | POA: Diagnosis present

## 2020-07-12 DIAGNOSIS — Z872 Personal history of diseases of the skin and subcutaneous tissue: Secondary | ICD-10-CM

## 2020-07-12 DIAGNOSIS — Z20822 Contact with and (suspected) exposure to covid-19: Secondary | ICD-10-CM | POA: Diagnosis present

## 2020-07-12 DIAGNOSIS — E669 Obesity, unspecified: Secondary | ICD-10-CM

## 2020-07-12 DIAGNOSIS — J45909 Unspecified asthma, uncomplicated: Secondary | ICD-10-CM | POA: Diagnosis present

## 2020-07-12 DIAGNOSIS — G8929 Other chronic pain: Secondary | ICD-10-CM | POA: Diagnosis present

## 2020-07-12 DIAGNOSIS — L0231 Cutaneous abscess of buttock: Secondary | ICD-10-CM | POA: Diagnosis present

## 2020-07-12 DIAGNOSIS — K611 Rectal abscess: Secondary | ICD-10-CM | POA: Diagnosis present

## 2020-07-12 LAB — CBC WITH DIFFERENTIAL/PLATELET
Abs Immature Granulocytes: 0.12 10*3/uL — ABNORMAL HIGH (ref 0.00–0.07)
Basophils Absolute: 0.1 10*3/uL (ref 0.0–0.1)
Basophils Relative: 0 %
Eosinophils Absolute: 0.3 10*3/uL (ref 0.0–0.5)
Eosinophils Relative: 1 %
HCT: 47.7 % (ref 39.0–52.0)
Hemoglobin: 15.9 g/dL (ref 13.0–17.0)
Immature Granulocytes: 1 %
Lymphocytes Relative: 13 %
Lymphs Abs: 3 10*3/uL (ref 0.7–4.0)
MCH: 21.2 pg — ABNORMAL LOW (ref 26.0–34.0)
MCHC: 33.3 g/dL (ref 30.0–36.0)
MCV: 63.5 fL — ABNORMAL LOW (ref 80.0–100.0)
Monocytes Absolute: 1.5 10*3/uL — ABNORMAL HIGH (ref 0.1–1.0)
Monocytes Relative: 6 %
Neutro Abs: 18.1 10*3/uL — ABNORMAL HIGH (ref 1.7–7.7)
Neutrophils Relative %: 79 %
Platelets: 222 10*3/uL (ref 150–400)
RBC: 7.51 MIL/uL — ABNORMAL HIGH (ref 4.22–5.81)
RDW: 18.7 % — ABNORMAL HIGH (ref 11.5–15.5)
WBC: 23.1 10*3/uL — ABNORMAL HIGH (ref 4.0–10.5)
nRBC: 0 % (ref 0.0–0.2)

## 2020-07-12 LAB — COMPREHENSIVE METABOLIC PANEL
ALT: 14 U/L (ref 0–44)
AST: 15 U/L (ref 15–41)
Albumin: 3.7 g/dL (ref 3.5–5.0)
Alkaline Phosphatase: 89 U/L (ref 38–126)
Anion gap: 7 (ref 5–15)
BUN: 7 mg/dL (ref 6–20)
CO2: 25 mmol/L (ref 22–32)
Calcium: 9.2 mg/dL (ref 8.9–10.3)
Chloride: 105 mmol/L (ref 98–111)
Creatinine, Ser: 0.81 mg/dL (ref 0.61–1.24)
GFR, Estimated: >60 mL/min (ref >60)
Glucose, Bld: 101 mg/dL — ABNORMAL HIGH (ref 70–99)
Potassium: 3.5 mmol/L (ref 3.5–5.1)
Sodium: 137 mmol/L (ref 135–145)
Total Bilirubin: 0.8 mg/dL (ref 0.3–1.2)
Total Protein: 7.9 g/dL (ref 6.5–8.1)

## 2020-07-12 LAB — APTT: aPTT: 28 seconds (ref 24–36)

## 2020-07-12 LAB — PROTIME-INR
INR: 1.1 (ref 0.8–1.2)
Prothrombin Time: 13.9 seconds (ref 11.4–15.2)

## 2020-07-12 LAB — LACTIC ACID, PLASMA: Lactic Acid, Venous: 1.7 mmol/L (ref 0.5–1.9)

## 2020-07-12 MED ORDER — LACTATED RINGERS IV SOLN: INTRAVENOUS | Status: DC

## 2020-07-12 MED ORDER — MORPHINE SULFATE (PF) 4 MG/ML IV SOLN
4.0000 mg | Freq: Once | INTRAVENOUS | Status: AC
Start: 2020-07-12 — End: 2020-07-12
  Administered 2020-07-12: 4 mg via INTRAVENOUS
  Filled 2020-07-12: qty 1

## 2020-07-12 NOTE — ED Triage Notes (Signed)
Pt c/o perirectal abcess that he noticed on Sunday, reports increasing pain. States he feels like it popped when he got here and has had some drainage. Denies fevers. Hx same

## 2020-07-12 NOTE — ED Provider Notes (Signed)
[facility]-EMERGENCY DEPT Provider Note   CSN: [id number] Arrival date & time: 07/12/20  2057     History Chief Complaint  Patient presents with   Cyst    Martin Crawford is a 45 y.o. male with a hx of pilonidal abscess and sepsis requiring surgical drainage approximately 1.5 years ago presents to the Emergency Department complaining of gradual, persistent, progressively worsening perirectal abscess onset 5 days ago. Associated symptoms include significant pain, swelling in the perirectal region.  Nothing makes it better and sitting and walking makes it worse.  Pt denies fever, chills, headache, neck pain, chest pain, shortness of breath, abdominal pain, nausea, vomiting, diarrhea, weakness, dizziness, syncope.  Patient reports after arrival here in the emergency department he believes the abscess opened while he was in the waiting room and is now draining.  Records reviewed: Patient was admitted on 09/22/2016 with enlarging, recurrent pilonidal abscess which required incision and drainage by Dr. Jeanella FlatteryHawksworth.  He was previously admitted for sepsis secondary to the pilonidal abscess previously.   The history is provided by the patient and medical records. No language interpreter was used.       Past Medical History:  Diagnosis Date   Asthma    Chronic back pain     Patient Active Problem List   Diagnosis Date Noted   Pilonidal abscess 09/22/2016   Abscess and cellulitis of gluteal region 09/17/2016   Sepsis (HCC) 09/17/2016   Tobacco abuse 09/17/2016   Chronic back pain    Asthma     Past Surgical History:  Procedure Laterality Date   ABSCESS DRAINAGE     pilonidal abscess   INCISION AND DRAINAGE ABSCESS N/A 09/23/2016   Procedure: INCISION AND DRAINAGE ABSCESS PILONIDAL ABCESS;  Surgeon: Glenna FellowsHoxworth, Benjamin, MD;  Location: [facility] ORS;  Service: General;  Laterality: N/A;       Family History  Problem Relation Age of Onset   Stroke Mother     Hypertension Mother    Asthma Father    Hypertension Father     Social History   Tobacco Use   Smoking status: Current Every Day Smoker    Packs/day: 1.00    Years: 28.00    Pack years: 28.00    Types: Cigarettes   Smokeless tobacco: Never Used  Building services engineerVaping Use   Vaping Use: Former  Substance Use Topics   Alcohol use: No   Drug use: No    Home Medications Prior to Admission medications   Medication Sig Start Date End Date Taking? Authorizing Provider  acetaminophen (TYLENOL) 500 MG tablet Take 2,000 mg by mouth every 6 (six) hours as needed for moderate pain.    [provider]  amoxicillin-clavulanate (AUGMENTIN) 875-125 MG tablet Take 1 tablet by mouth 2 (two) times daily. For skin infection; take after eating 03/10/19   Fulp, Cammie, MD  cyclobenzaprine (FLEXERIL) 10 MG tablet Take 1 tablet (10 mg total) by mouth 2 (two) times daily as needed for muscle spasms. 03/10/19   Fulp, Cammie, MD  metFORMIN (GLUCOPHAGE) 500 MG tablet Take 1 tablet (500 mg total) by mouth 2 (two) times daily with a meal. 03/10/19   Fulp, Cammie, MD  naproxen (NAPROSYN) 500 MG tablet Take 1 tablet (500 mg total) by mouth 2 (two) times daily between meals as needed for moderate pain or headache. 03/25/19   Lorelee NewGreen, Garrett L, PA-C    Allergies    Patient has no known allergies.  Review of Systems   Review of Systems  Constitutional: Negative for appetite change, diaphoresis, fatigue, fever and unexpected weight change.  HENT: Negative for mouth sores.   Eyes: Negative for visual disturbance.  Respiratory: Negative for cough, chest tightness, shortness of breath and wheezing.   Cardiovascular: Negative for chest pain.  Gastrointestinal: Negative for abdominal pain, constipation, diarrhea, nausea and vomiting.  Endocrine: Negative for polydipsia, polyphagia and polyuria.  Genitourinary: Negative for dysuria, frequency, hematuria and urgency.       Perirectal abscess  Musculoskeletal:  Negative for back pain and neck stiffness.  Skin: Positive for wound. Negative for rash.  Allergic/Immunologic: Negative for immunocompromised state.  Neurological: Negative for syncope, light-headedness and headaches.  Hematological: Does not bruise/bleed easily.  Psychiatric/Behavioral: Negative for sleep disturbance. The patient is not nervous/anxious.   All other systems reviewed and are negative.   Physical Exam Updated Vital Signs BP (!) 149/136 (BP Location: Left Arm)    Pulse (!) 104    Temp 98.6 F (37 C) (Oral)    Resp 16    Ht 5\' 9"  (1.753 m)    Wt 117.9 kg    SpO2 98%    BMI 38.40 kg/m   Physical Exam Vitals and nursing note reviewed. Exam conducted with a chaperone present.  Constitutional:      General: He is not in acute distress.    Appearance: He is not diaphoretic.  HENT:     Head: Normocephalic.  Eyes:     General: No scleral icterus.    Conjunctiva/sclera: Conjunctivae normal.  Cardiovascular:     Rate and Rhythm: Regular rhythm. Tachycardia present.     Pulses: Normal pulses.          Radial pulses are 2+ on the right side and 2+ on the left side.  Pulmonary:     Effort: Pulmonary effort is normal. No tachypnea, accessory muscle usage, prolonged expiration, respiratory distress or retractions.     Breath sounds: No stridor.     Comments: Equal chest rise. No increased work of breathing. Abdominal:     General: There is no distension.     Palpations: Abdomen is soft.     Tenderness: There is no abdominal tenderness. There is no guarding or rebound.  Genitourinary:    Rectum: Mass and tenderness present.    Musculoskeletal:        General: Normal range of motion.     Cervical back: Normal range of motion.     Comments: Moves all extremities equally and without difficulty.  Skin:    General: Skin is warm and dry.     Capillary Refill: Capillary refill takes less than 2 seconds.  Neurological:     Mental Status: He is alert.     GCS: GCS eye  subscore is 4. GCS verbal subscore is 5. GCS motor subscore is 6.     Comments: Speech is clear and goal oriented.  Psychiatric:        Mood and Affect: Mood normal.     ED Results / Procedures / Treatments   Labs (all labs ordered are listed, but only abnormal results are displayed) Labs Reviewed  COMPREHENSIVE METABOLIC PANEL - Abnormal; Notable for the following components:      Result Value   Glucose, Bld 101 (*)    All other components within normal limits  CBC WITH DIFFERENTIAL/PLATELET - Abnormal; Notable for the following components:   WBC 23.1 (*)    RBC 7.51 (*)    MCV 63.5 (*)    MCH 21.2 (*)  RDW 18.7 (*)    Neutro Abs 18.1 (*)    Monocytes Absolute 1.5 (*)    Abs Immature Granulocytes 0.12 (*)    All other components within normal limits  CULTURE, BLOOD (SINGLE)  URINE CULTURE  SARS CORONAVIRUS 2 (TAT 6-24 HRS)  CULTURE, BLOOD (SINGLE)  LACTIC ACID, PLASMA  PROTIME-INR  APTT  LACTIC ACID, PLASMA  URINALYSIS, ROUTINE W REFLEX MICROSCOPIC    EKG EKG Interpretation  Date/Time:  Thursday July 12 2020 22:32:33 EDT Ventricular Rate:  84 PR Interval:    QRS Duration: 97 QT Interval:  394 QTC Calculation: 466 R Axis:   91 Text Interpretation: Sinus rhythm Left atrial enlargement Borderline right axis deviation RSR' in V1 or V2, probably normal variant Left ventricular hypertrophy ST elev, probable normal early repol pattern Poor data quality Confirmed by Linwood Dibbles [phone]) on 07/12/2020 11:04:37 PM   Radiology CT PELVIS W CONTRAST  Result Date: 07/13/2020 CLINICAL DATA:  Perirectal abscess EXAM: CT PELVIS WITH CONTRAST TECHNIQUE: Multidetector CT imaging of the pelvis was performed using the standard protocol following the bolus administration of intravenous contrast. CONTRAST:  OMNIPAQUE IOHEXOL 300 MG/ML  SOLN COMPARISON:  Sep 17, 2016 FINDINGS: Urinary Tract: The visualized distal ureters and bladder appear unremarkable. Bowel: No bowel wall  thickening, distention or surrounding inflammation identified within the pelvis. Vascular/Lymphatic: No enlarged pelvic lymph nodes identified. No significant vascular findings. Reproductive: The prostate is unremarkable. Other: Within the right posterior perineal soft tissues there is a small multilocular fluid collection measuring 4.1 x 1.1 by 3.2 cm surrounding skin thickening and subcutaneous edema is noted. No subcutaneous emphysema is seen. A right inguinal fat containing hernia is seen. There is scattered right inguinal lymph nodes, the largest measuring 1.7 cm Musculoskeletal: No acute or significant osseous findings. IMPRESSION: Soft tissue abscess within the posterior right perineal soft tissues measuring 4.1 x 1.1 x 3.2 cm. No subcutaneous emphysema is seen. Right inguinal reactive lymphadenopathy Electronically Signed   By: Jonna Clark M.D.   On: 07/13/2020 00:55    Procedures .Critical Care Performed by: Dierdre Forth, PA-C Authorized by: Dierdre Forth, PA-C   Critical care provider statement:    Critical care time (minutes):  45   Critical care time was exclusive of:  Separately billable procedures and treating other patients and teaching time   Critical care was necessary to treat or prevent imminent or life-threatening deterioration of the following conditions:  Sepsis   Critical care was time spent personally by me on the following activities:  Discussions with consultants, evaluation of patient's response to treatment, examination of patient, ordering and performing treatments and interventions, ordering and review of laboratory studies, ordering and review of radiographic studies, pulse oximetry, re-evaluation of patient's condition, obtaining history from patient or surrogate and review of old charts   I assumed direction of critical care for this patient from another provider in my specialty: no     Care discussed with: admitting provider       Medications  Ordered in ED Medications  lactated ringers infusion ( Intravenous New Bag/Given 07/12/20 2301)  lactated ringers infusion (has no administration in time range)  lactated ringers bolus 1,000 mL (has no administration in time range)    And  lactated ringers bolus 1,000 mL (has no administration in time range)    And  lactated ringers bolus 200 mL (has no administration in time range)  cefTRIAXone (ROCEPHIN) 2 g in sodium chloride 0.9 % 100 mL IVPB (has no administration  in time range)  vancomycin (VANCOREADY) IVPB 2000 mg/400 mL (has no administration in time range)  morphine 4 MG/ML injection 4 mg (4 mg Intravenous Given 07/12/20 2355)  iohexol (OMNIPAQUE) 300 MG/ML solution 100 mL (100 mLs Intravenous Contrast Given 07/13/20 0035)    ED Course  I have reviewed the triage vital signs and the nursing notes.  Pertinent labs & imaging results that were available during my care of the patient were reviewed by me and considered in my medical decision making (see chart for details).    MDM Rules/Calculators/A&P                           Patient presents with recurrent pilonidal abscess with history of sepsis and surgical drainage of same.  Considering perirectal abscess, pilonidal abscess, superficial abscess, sepsis.  Additional history obtained:  Additional history obtained from wife and record. Previous records obtained and reviewed.    Lab Tests:  I Ordered, reviewed, and interpreted labs, which included:  Sepsis screening - patient with significant leukocytosis with left shift.  Normal lactic acid however given patient's initial tachycardia, tachypnea and significant leukocytosis with a source of infection will treat as sepsis.  ECG:  I personally viewed and interpreted the ECG obtained.  This rhythm without ischemia   Imaging Studies ordered:  I have placed order for imaging including CT pelvis. I personally reviewed and interpreted imaging. Soft tissue abscess within the  posterior right perineal soft tissues measuring 4.1 x 1.1 x 3.2 cm.  Correlates to the site that is actively draining and tender.   ED Course:  Patient presents with complaints of pilonidal abscess.  He is tachycardic on arrival.  Afebrile orally.  Unable to obtain rectal temperature as that is the site of his abscess.  Abscess is currently draining large volumes of purulent fluid.  Will obtain labs and CT pelvis.  2:04 AM Pain with significant leukocytosis remains tachypneic with a site of infection.  Given complicated history of abscess creating sepsis and requiring I&D in the OR will admit, treat for sepsis and consult with general surgery.  2:25 AM  Discussed patient's case with hospitalist, Dr. Rachael Darby.  I have recommended admission and patient (and family if present) agree with this plan. Admitting physician will place admission orders.   3:10 AM Dr. Judd Lien Discussed with Dr. Michaell Cowing, general surgery, who will evaluate in the AM.  Patient voiced understanding and agreement wit the current medical evaluation and treatment plan.  Questions were answered to expressed satisfaction.    Portions of this note were generated with Scientist, clinical (histocompatibility and immunogenetics). Dictation errors may occur despite best attempts at proofreading.   Final Clinical Impression(s) / ED Diagnoses Final diagnoses:  Perineal abscess  Sepsis, due to unspecified organism, unspecified whether acute organ dysfunction present [facility])    Rx / DC Orders ED Discharge Orders    None       Melrose Kearse, Boyd Kerbs 07/13/20 1194    Geoffery Lyons, MD 07/13/20 [phone]

## 2020-07-13 ENCOUNTER — Emergency Department (HOSPITAL_COMMUNITY): Payer: 59

## 2020-07-13 ENCOUNTER — Encounter (HOSPITAL_COMMUNITY): Payer: Self-pay

## 2020-07-13 ENCOUNTER — Inpatient Hospital Stay (HOSPITAL_COMMUNITY): Payer: 59 | Admitting: Anesthesiology

## 2020-07-13 ENCOUNTER — Other Ambulatory Visit: Payer: Self-pay

## 2020-07-13 ENCOUNTER — Encounter (HOSPITAL_COMMUNITY)
Admission: EM | Disposition: A | Discharge status: Home / Self Care | Payer: Self-pay | Source: Home / Self Care | Attending: Internal Medicine

## 2020-07-13 DIAGNOSIS — Z7984 Long term (current) use of oral hypoglycemic drugs: Secondary | ICD-10-CM | POA: Diagnosis not present

## 2020-07-13 DIAGNOSIS — L0231 Cutaneous abscess of buttock: Secondary | ICD-10-CM | POA: Diagnosis present

## 2020-07-13 DIAGNOSIS — E119 Type 2 diabetes mellitus without complications: Secondary | ICD-10-CM | POA: Diagnosis present

## 2020-07-13 DIAGNOSIS — L02215 Cutaneous abscess of perineum: Secondary | ICD-10-CM | POA: Diagnosis present

## 2020-07-13 DIAGNOSIS — E669 Obesity, unspecified: Secondary | ICD-10-CM

## 2020-07-13 DIAGNOSIS — F17210 Nicotine dependence, cigarettes, uncomplicated: Secondary | ICD-10-CM | POA: Diagnosis present

## 2020-07-13 DIAGNOSIS — K921 Melena: Secondary | ICD-10-CM | POA: Diagnosis present

## 2020-07-13 DIAGNOSIS — Z20822 Contact with and (suspected) exposure to covid-19: Secondary | ICD-10-CM | POA: Diagnosis present

## 2020-07-13 DIAGNOSIS — J45909 Unspecified asthma, uncomplicated: Secondary | ICD-10-CM | POA: Diagnosis present

## 2020-07-13 DIAGNOSIS — Z79899 Other long term (current) drug therapy: Secondary | ICD-10-CM | POA: Diagnosis not present

## 2020-07-13 DIAGNOSIS — K611 Rectal abscess: Secondary | ICD-10-CM | POA: Diagnosis present

## 2020-07-13 DIAGNOSIS — Z872 Personal history of diseases of the skin and subcutaneous tissue: Secondary | ICD-10-CM | POA: Diagnosis not present

## 2020-07-13 DIAGNOSIS — Z6834 Body mass index (BMI) 34.0-34.9, adult: Secondary | ICD-10-CM | POA: Diagnosis not present

## 2020-07-13 DIAGNOSIS — G8929 Other chronic pain: Secondary | ICD-10-CM | POA: Diagnosis present

## 2020-07-13 DIAGNOSIS — A419 Sepsis, unspecified organism: Secondary | ICD-10-CM | POA: Diagnosis present

## 2020-07-13 DIAGNOSIS — Z720 Tobacco use: Secondary | ICD-10-CM

## 2020-07-13 HISTORY — DX: Personal history of diseases of the skin and subcutaneous tissue: Z87.2

## 2020-07-13 HISTORY — DX: Type 2 diabetes mellitus without complications: E11.9

## 2020-07-13 HISTORY — PX: INCISION AND DRAINAGE ABSCESS: SHX5864

## 2020-07-13 LAB — CBC
HCT: 44.3 % (ref 39.0–52.0)
Hemoglobin: 14.8 g/dL (ref 13.0–17.0)
MCH: 21.3 pg — ABNORMAL LOW (ref 26.0–34.0)
MCHC: 33.4 g/dL (ref 30.0–36.0)
MCV: 63.8 fL — ABNORMAL LOW (ref 80.0–100.0)
Platelets: 212 10*3/uL (ref 150–400)
RBC: 6.94 MIL/uL — ABNORMAL HIGH (ref 4.22–5.81)
RDW: 18.2 % — ABNORMAL HIGH (ref 11.5–15.5)
WBC: 21.4 10*3/uL — ABNORMAL HIGH (ref 4.0–10.5)
nRBC: 0 % (ref 0.0–0.2)

## 2020-07-13 LAB — GLUCOSE, CAPILLARY
Glucose-Capillary: 107 mg/dL — ABNORMAL HIGH (ref 70–99)
Glucose-Capillary: 122 mg/dL — ABNORMAL HIGH (ref 70–99)
Glucose-Capillary: 134 mg/dL — ABNORMAL HIGH (ref 70–99)
Glucose-Capillary: 117 mg/dL — ABNORMAL HIGH (ref 70–99)
Glucose-Capillary: 113 mg/dL — ABNORMAL HIGH (ref 70–99)
Glucose-Capillary: 145 mg/dL — ABNORMAL HIGH (ref 70–99)
Glucose-Capillary: 100 mg/dL — ABNORMAL HIGH (ref 70–99)

## 2020-07-13 LAB — BASIC METABOLIC PANEL
Anion gap: 8 (ref 5–15)
BUN: <5 mg/dL — ABNORMAL LOW (ref 6–20)
CO2: 25 mmol/L (ref 22–32)
Calcium: 8.9 mg/dL (ref 8.9–10.3)
Chloride: 105 mmol/L (ref 98–111)
Creatinine, Ser: 0.72 mg/dL (ref 0.61–1.24)
GFR, Estimated: >60 mL/min (ref >60)
Glucose, Bld: 104 mg/dL — ABNORMAL HIGH (ref 70–99)
Potassium: 3.7 mmol/L (ref 3.5–5.1)
Sodium: 138 mmol/L (ref 135–145)

## 2020-07-13 LAB — URINALYSIS, ROUTINE W REFLEX MICROSCOPIC
Bilirubin Urine: NEGATIVE
Glucose, UA: NEGATIVE mg/dL
Hgb urine dipstick: NEGATIVE
Ketones, ur: NEGATIVE mg/dL
Leukocytes,Ua: NEGATIVE
Nitrite: NEGATIVE
Protein, ur: NEGATIVE mg/dL
Specific Gravity, Urine: 1.043 — ABNORMAL HIGH (ref 1.005–1.030)
pH: 6 (ref 5.0–8.0)

## 2020-07-13 LAB — HIV ANTIBODY (ROUTINE TESTING W REFLEX): HIV Screen 4th Generation wRfx: NONREACTIVE

## 2020-07-13 LAB — MRSA PCR SCREENING: MRSA by PCR: NEGATIVE

## 2020-07-13 LAB — HEMOGLOBIN A1C
Hgb A1c MFr Bld: 6.8 % — ABNORMAL HIGH (ref 4.8–5.6)
Mean Plasma Glucose: 148.46 mg/dL

## 2020-07-13 LAB — LACTIC ACID, PLASMA: Lactic Acid, Venous: 1.2 mmol/L (ref 0.5–1.9)

## 2020-07-13 LAB — SARS CORONAVIRUS 2 (TAT 6-24 HRS): SARS Coronavirus 2: NEGATIVE

## 2020-07-13 SURGERY — INCISION AND DRAINAGE, ABSCESS
Anesthesia: Monitor Anesthesia Care

## 2020-07-13 MED ORDER — CHLORHEXIDINE GLUCONATE 0.12 % MT SOLN
15.0000 mL | OROMUCOSAL | Status: AC
  Administered 2020-07-13: 15 mL via OROMUCOSAL

## 2020-07-13 MED ORDER — ACETAMINOPHEN 500 MG PO TABS
1000.0000 mg | ORAL_TABLET | Freq: Four times a day (QID) | ORAL | Status: DC
  Administered 2020-07-13 – 2020-07-14 (×3): 1000 mg via ORAL
  Filled 2020-07-13 (×4): qty 2

## 2020-07-13 MED ORDER — IOHEXOL 300 MG/ML  SOLN
100.0000 mL | Freq: Once | INTRAMUSCULAR | Status: AC | PRN
  Administered 2020-07-13: 100 mL via INTRAVENOUS

## 2020-07-13 MED ORDER — INSULIN ASPART 100 UNIT/ML ~~LOC~~ SOLN
0.0000 [IU] | SUBCUTANEOUS | Status: DC
  Filled 2020-07-13: qty 0.15

## 2020-07-13 MED ORDER — DIPHENHYDRAMINE HCL 50 MG/ML IJ SOLN: 12.5000 mg | Freq: Four times a day (QID) | INTRAMUSCULAR | Status: DC | PRN

## 2020-07-13 MED ORDER — MIDAZOLAM HCL 2 MG/2ML IJ SOLN
INTRAMUSCULAR | Status: AC
  Filled 2020-07-13: qty 2

## 2020-07-13 MED ORDER — ACETAMINOPHEN 325 MG PO TABS: 650.0000 mg | ORAL_TABLET | Freq: Four times a day (QID) | ORAL | Status: DC | PRN

## 2020-07-13 MED ORDER — LACTATED RINGERS IV BOLUS
1000.0000 mL | Freq: Once | INTRAVENOUS | Status: AC
  Administered 2020-07-13: 1000 mL via INTRAVENOUS

## 2020-07-13 MED ORDER — NICOTINE 21 MG/24HR TD PT24
21.0000 mg | MEDICATED_PATCH | Freq: Every day | TRANSDERMAL | Status: DC
  Administered 2020-07-13 – 2020-07-14 (×2): 21 mg via TRANSDERMAL
  Filled 2020-07-13 (×2): qty 1

## 2020-07-13 MED ORDER — PSYLLIUM 95 % PO PACK
1.0000 | PACK | Freq: Two times a day (BID) | ORAL | Status: DC
  Administered 2020-07-13 – 2020-07-14 (×2): 1 via ORAL
  Filled 2020-07-13 (×2): qty 1

## 2020-07-13 MED ORDER — ENOXAPARIN SODIUM 40 MG/0.4ML ~~LOC~~ SOLN: 40.0000 mg | SUBCUTANEOUS | Status: DC

## 2020-07-13 MED ORDER — METHOCARBAMOL 500 MG PO TABS: 1000.0000 mg | ORAL_TABLET | Freq: Four times a day (QID) | ORAL | Status: DC | PRN

## 2020-07-13 MED ORDER — ONDANSETRON HCL 4 MG/2ML IJ SOLN: 4.0000 mg | Freq: Four times a day (QID) | INTRAMUSCULAR | Status: DC | PRN

## 2020-07-13 MED ORDER — PIPERACILLIN-TAZOBACTAM 3.375 G IVPB
3.3750 g | Freq: Three times a day (TID) | INTRAVENOUS | Status: DC
  Administered 2020-07-13 – 2020-07-14 (×4): 3.375 g via INTRAVENOUS
  Filled 2020-07-13 (×6): qty 50

## 2020-07-13 MED ORDER — HYDROMORPHONE HCL 1 MG/ML IJ SOLN: 0.5000 mg | INTRAMUSCULAR | Status: DC | PRN

## 2020-07-13 MED ORDER — HYDROMORPHONE HCL 1 MG/ML IJ SOLN
1.0000 mg | INTRAMUSCULAR | Status: DC | PRN
  Administered 2020-07-13 – 2020-07-14 (×2): 1 mg via INTRAVENOUS
  Filled 2020-07-13 (×2): qty 1

## 2020-07-13 MED ORDER — FENTANYL CITRATE (PF) 100 MCG/2ML IJ SOLN
INTRAMUSCULAR | Status: DC | PRN
  Administered 2020-07-13: 25 ug via INTRAVENOUS

## 2020-07-13 MED ORDER — ONDANSETRON HCL 4 MG/2ML IJ SOLN: 4.0000 mg | Freq: Once | INTRAMUSCULAR | Status: DC | PRN

## 2020-07-13 MED ORDER — VANCOMYCIN HCL IN DEXTROSE 1-5 GM/200ML-% IV SOLN: 1000.0000 mg | Freq: Once | INTRAVENOUS | Status: DC

## 2020-07-13 MED ORDER — LIDOCAINE 2% (20 MG/ML) 5 ML SYRINGE
INTRAMUSCULAR | Status: DC | PRN
  Administered 2020-07-13: 100 mg via INTRAVENOUS

## 2020-07-13 MED ORDER — BUPIVACAINE-EPINEPHRINE (PF) 0.25% -1:200000 IJ SOLN
INTRAMUSCULAR | Status: AC
  Filled 2020-07-13: qty 30

## 2020-07-13 MED ORDER — VANCOMYCIN HCL 2000 MG/400ML IV SOLN
2000.0000 mg | Freq: Once | INTRAVENOUS | Status: AC
  Administered 2020-07-13: 2000 mg via INTRAVENOUS
  Filled 2020-07-13: qty 400

## 2020-07-13 MED ORDER — ACETAMINOPHEN 500 MG PO TABS
1000.0000 mg | ORAL_TABLET | ORAL | Status: AC
  Administered 2020-07-13: 1000 mg via ORAL

## 2020-07-13 MED ORDER — INSULIN ASPART 100 UNIT/ML ~~LOC~~ SOLN
0.0000 [IU] | Freq: Three times a day (TID) | SUBCUTANEOUS | Status: DC
  Administered 2020-07-13: 1 [IU] via SUBCUTANEOUS

## 2020-07-13 MED ORDER — LACTATED RINGERS IV BOLUS (SEPSIS)
1000.0000 mL | Freq: Once | INTRAVENOUS | Status: AC
  Administered 2020-07-13: 1000 mL via INTRAVENOUS

## 2020-07-13 MED ORDER — GLUCERNA SHAKE PO LIQD
237.0000 mL | Freq: Two times a day (BID) | ORAL | Status: DC
  Administered 2020-07-14: 237 mL via ORAL
  Filled 2020-07-13 (×4): qty 237

## 2020-07-13 MED ORDER — ACETAMINOPHEN 650 MG RE SUPP: 650.0000 mg | Freq: Four times a day (QID) | RECTAL | Status: DC | PRN

## 2020-07-13 MED ORDER — OXYCODONE HCL 5 MG/5ML PO SOLN: 5.0000 mg | Freq: Once | ORAL | Status: DC | PRN

## 2020-07-13 MED ORDER — LACTATED RINGERS IV SOLN: INTRAVENOUS | Status: DC

## 2020-07-13 MED ORDER — FENTANYL CITRATE (PF) 100 MCG/2ML IJ SOLN
INTRAMUSCULAR | Status: AC
  Filled 2020-07-13: qty 2

## 2020-07-13 MED ORDER — ONDANSETRON HCL 4 MG PO TABS: 4.0000 mg | ORAL_TABLET | Freq: Four times a day (QID) | ORAL | Status: DC | PRN

## 2020-07-13 MED ORDER — PROPOFOL 500 MG/50ML IV EMUL
INTRAVENOUS | Status: DC | PRN
  Administered 2020-07-13: 125 ug/kg/min via INTRAVENOUS

## 2020-07-13 MED ORDER — PROPOFOL 10 MG/ML IV BOLUS
INTRAVENOUS | Status: DC | PRN
  Administered 2020-07-13 (×2): 20 mg via INTRAVENOUS

## 2020-07-13 MED ORDER — OXYCODONE HCL 5 MG PO TABS: 5.0000 mg | ORAL_TABLET | Freq: Four times a day (QID) | ORAL | Status: DC | PRN

## 2020-07-13 MED ORDER — GABAPENTIN 300 MG PO CAPS
300.0000 mg | ORAL_CAPSULE | ORAL | Status: AC
  Administered 2020-07-13: 300 mg via ORAL
  Filled 2020-07-13: qty 1

## 2020-07-13 MED ORDER — HYDROCORT-PRAMOXINE (PERIANAL) 2.5-1 % EX CREA: 1.0000 "application " | TOPICAL_CREAM | Freq: Four times a day (QID) | CUTANEOUS | Status: DC | PRN

## 2020-07-13 MED ORDER — LIP MEDEX EX OINT
1.0000 "application " | TOPICAL_OINTMENT | Freq: Two times a day (BID) | CUTANEOUS | Status: DC
  Administered 2020-07-13 – 2020-07-14 (×3): 1 via TOPICAL
  Filled 2020-07-13 (×2): qty 7

## 2020-07-13 MED ORDER — BUPIVACAINE LIPOSOME 1.3 % IJ SUSP
20.0000 mL | Freq: Once | INTRAMUSCULAR | Status: DC
  Filled 2020-07-13: qty 20

## 2020-07-13 MED ORDER — PROPOFOL 10 MG/ML IV BOLUS
INTRAVENOUS | Status: AC
  Filled 2020-07-13: qty 20

## 2020-07-13 MED ORDER — VANCOMYCIN HCL 1500 MG/300ML IV SOLN
1500.0000 mg | Freq: Two times a day (BID) | INTRAVENOUS | Status: DC
  Filled 2020-07-13: qty 300

## 2020-07-13 MED ORDER — MAGIC MOUTHWASH
15.0000 mL | Freq: Four times a day (QID) | ORAL | Status: DC | PRN
  Filled 2020-07-13: qty 15

## 2020-07-13 MED ORDER — PROPOFOL 500 MG/50ML IV EMUL
INTRAVENOUS | Status: AC
  Filled 2020-07-13: qty 50

## 2020-07-13 MED ORDER — VANCOMYCIN HCL 1250 MG/250ML IV SOLN
1250.0000 mg | Freq: Two times a day (BID) | INTRAVENOUS | Status: DC
  Administered 2020-07-13 – 2020-07-14 (×2): 1250 mg via INTRAVENOUS
  Filled 2020-07-13 (×3): qty 250

## 2020-07-13 MED ORDER — MIDAZOLAM HCL 5 MG/5ML IJ SOLN
INTRAMUSCULAR | Status: DC | PRN
  Administered 2020-07-13: 2 mg via INTRAVENOUS

## 2020-07-13 MED ORDER — SODIUM CHLORIDE 0.9 % IV SOLN
8.0000 mg | Freq: Four times a day (QID) | INTRAVENOUS | Status: DC | PRN
  Filled 2020-07-13: qty 4

## 2020-07-13 MED ORDER — WITCH HAZEL-GLYCERIN EX PADS: 1.0000 "application " | MEDICATED_PAD | CUTANEOUS | Status: DC | PRN

## 2020-07-13 MED ORDER — METHOCARBAMOL 1000 MG/10ML IJ SOLN
1000.0000 mg | Freq: Four times a day (QID) | INTRAVENOUS | Status: DC | PRN
  Filled 2020-07-13: qty 10

## 2020-07-13 MED ORDER — CHLORHEXIDINE GLUCONATE CLOTH 2 % EX PADS
6.0000 | MEDICATED_PAD | Freq: Once | CUTANEOUS | Status: AC
  Administered 2020-07-13: 6 via TOPICAL

## 2020-07-13 MED ORDER — BUPIVACAINE-EPINEPHRINE (PF) 0.25% -1:200000 IJ SOLN
INTRAMUSCULAR | Status: DC | PRN
  Administered 2020-07-13: 5 mL

## 2020-07-13 MED ORDER — LACTATED RINGERS IV BOLUS (SEPSIS): 200.0000 mL | Freq: Once | INTRAVENOUS | Status: DC

## 2020-07-13 MED ORDER — OXYCODONE HCL 5 MG PO TABS: 5.0000 mg | ORAL_TABLET | Freq: Once | ORAL | Status: DC | PRN

## 2020-07-13 MED ORDER — LIDOCAINE 2% (20 MG/ML) 5 ML SYRINGE
INTRAMUSCULAR | Status: AC
  Filled 2020-07-13: qty 5

## 2020-07-13 MED ORDER — OXYCODONE HCL 5 MG PO TABS
5.0000 mg | ORAL_TABLET | ORAL | Status: DC | PRN
  Administered 2020-07-13: 10 mg via ORAL
  Filled 2020-07-13: qty 2

## 2020-07-13 MED ORDER — LACTATED RINGERS IV BOLUS: 1000.0000 mL | Freq: Three times a day (TID) | INTRAVENOUS | Status: DC | PRN

## 2020-07-13 MED ORDER — GLYCOPYRROLATE PF 0.2 MG/ML IJ SOSY
PREFILLED_SYRINGE | INTRAMUSCULAR | Status: AC
  Filled 2020-07-13: qty 1

## 2020-07-13 MED ORDER — SODIUM CHLORIDE 0.9 % IV SOLN: 1.0000 g | INTRAVENOUS | Status: DC

## 2020-07-13 MED ORDER — GLYCOPYRROLATE 0.2 MG/ML IJ SOLN
INTRAMUSCULAR | Status: DC | PRN
  Administered 2020-07-13: .2 mg via INTRAVENOUS

## 2020-07-13 MED ORDER — INSULIN ASPART 100 UNIT/ML ~~LOC~~ SOLN: 0.0000 [IU] | Freq: Every day | SUBCUTANEOUS | Status: DC

## 2020-07-13 MED ORDER — AMISULPRIDE (ANTIEMETIC) 5 MG/2ML IV SOLN: 10.0000 mg | Freq: Once | INTRAVENOUS | Status: DC | PRN

## 2020-07-13 MED ORDER — CELECOXIB 200 MG PO CAPS
200.0000 mg | ORAL_CAPSULE | ORAL | Status: AC
  Administered 2020-07-13: 200 mg via ORAL
  Filled 2020-07-13 (×2): qty 1

## 2020-07-13 MED ORDER — ONDANSETRON HCL 4 MG/2ML IJ SOLN
INTRAMUSCULAR | Status: AC
  Filled 2020-07-13: qty 2

## 2020-07-13 MED ORDER — FENTANYL CITRATE (PF) 100 MCG/2ML IJ SOLN: 25.0000 ug | INTRAMUSCULAR | Status: DC | PRN

## 2020-07-13 MED ORDER — KETAMINE HCL 10 MG/ML IJ SOLN
INTRAMUSCULAR | Status: DC | PRN
  Administered 2020-07-13 (×2): 10 mg via INTRAVENOUS

## 2020-07-13 MED ORDER — SODIUM CHLORIDE 0.9 % IV SOLN
2.0000 g | Freq: Once | INTRAVENOUS | Status: AC
  Administered 2020-07-13: 2 g via INTRAVENOUS
  Filled 2020-07-13: qty 20

## 2020-07-13 SURGICAL SUPPLY — 28 items
BLADE SURG SZ11 CARB STEEL (BLADE) ×2 IMPLANT
CHLORAPREP W/TINT 26 (MISCELLANEOUS) ×2 IMPLANT
COVER SURGICAL LIGHT HANDLE (MISCELLANEOUS) ×2 IMPLANT
COVER WAND RF STERILE (DRAPES) IMPLANT
DECANTER SPIKE VIAL GLASS SM (MISCELLANEOUS) IMPLANT
DERMABOND ADVANCED (GAUZE/BANDAGES/DRESSINGS) ×1
DERMABOND ADVANCED .7 DNX12 (GAUZE/BANDAGES/DRESSINGS) ×1 IMPLANT
DRAPE LAPAROSCOPIC ABDOMINAL (DRAPES) IMPLANT
DRAPE LAPAROTOMY TRNSV 102X78 (DRAPES) IMPLANT
DRSG PAD ABDOMINAL 8X10 ST (GAUZE/BANDAGES/DRESSINGS) IMPLANT
ELECT REM PT RETURN 15FT ADLT (MISCELLANEOUS) ×2 IMPLANT
GAUZE PACKING 1/2X5YD (GAUZE/BANDAGES/DRESSINGS) ×2 IMPLANT
GAUZE SPONGE 4X4 12PLY STRL (GAUZE/BANDAGES/DRESSINGS) ×2 IMPLANT
GLOVE SURG ENC MOIS LTX SZ6 (GLOVE) ×2 IMPLANT
GLOVE SURG POLYISO LF SZ7 (GLOVE) ×2 IMPLANT
GLOVE SURG UNDER LTX SZ6.5 (GLOVE) ×2 IMPLANT
GOWN STRL REUS W/TWL LRG LVL3 (GOWN DISPOSABLE) ×2 IMPLANT
GOWN STRL REUS W/TWL XL LVL3 (GOWN DISPOSABLE) ×2 IMPLANT
KIT BASIN OR (CUSTOM PROCEDURE TRAY) ×2 IMPLANT
KIT TURNOVER KIT A (KITS) ×2 IMPLANT
PACK GENERAL/GYN (CUSTOM PROCEDURE TRAY) ×2 IMPLANT
PENCIL SMOKE EVACUATOR (MISCELLANEOUS) IMPLANT
SURGILUBE 2OZ TUBE FLIPTOP (MISCELLANEOUS) IMPLANT
SUT MNCRL AB 4-0 PS2 18 (SUTURE) IMPLANT
SWAB COLLECTION DEVICE MRSA (MISCELLANEOUS) IMPLANT
SWAB CULTURE ESWAB REG 1ML (MISCELLANEOUS) IMPLANT
TOWEL OR 17X26 10 PK STRL BLUE (TOWEL DISPOSABLE) ×2 IMPLANT
TOWEL OR NON WOVEN STRL DISP B (DISPOSABLE) ×2 IMPLANT

## 2020-07-13 NOTE — Progress Notes (Signed)
Care started prior to Midnight in the Emergency Room and patient was admitted early this AM after midnight by Dr. Trey Paula and I am in current agreement with his Assessment and Plan. Additional changes to the plan of care have been made accordingly. The patient is an obese African-American male with a past medical history significant for but not limited to pilonidal abscess, asthma, diet-controlled diabetes mellitus type 2, chronic back pain, history of frequent abscesses of the skin and subcutaneous tissue and other comorbidities who presented with a boil on his buttocks that was draining.  He states that he developed a boil on Sunday is right lower buttock that enlarged over the past 3 days associated with significant pain and swelling in the perirectal region.  He states that nothing makes it better and sitting and walking makes it worse.  He denied any other complaints reports that the abscess started to spontaneous drainage.  Back in 2018 he was admitted with an enlarging right current pilonidal abscess that required incision and drainage by Dr. Johna Sheriff.  He was previously admitted for sepsis secondary to the pilonidal cyst.  He presented to the ED and was worked up with a CT of the pelvis which showed a soft tissue abscess within the posterior right perineal soft tissues measuring 4.1 x 1.1 x 3.2 cm with no subcutaneous emphysema seen and right inguinal reactive lymphadenopathy.  General surgery was consulted as well as wound care and he has been been placed on IV antibiotics with IV vancomycin and IV Zosyn. Currently he is being admitted and treated for the following but not limited too:   Sepsis secondary to Perineal Abscess, poA -Started on antibiotics with IV Rocephin and IV vancomycin but then his Rocephin has been escalated to IV Zosyn by surgery -CT scan of the pelvis showed a soft tissue abscess within the posterior right perineal soft tissues measuring 4.1 x 1.1 x 3.2 cm with no  subcutaneous emphysema seen and right inguinal reactive lymphadenopathy -Abscess was spontaneously draining and wound care evaluated but are deferring to surgery -General surgery has evaluated and planning on taking the patient to the OR for irrigation and debridement to ensure adequate drainage -Patient was septic on admission and had a WBC of 23,100 and is now trended down to 21,400 and he presented with tachycardia and tachypnea with a source of infection -Sepsis protocol initiated in the ED and he was started on 2 L of lactated Ringer's boluses and then started on maintenance IV fluids -Pain control with oxycodone and IV morphine -He has Rectal Creams with Hydrocortisone-Pramoxine 1 application RC -Continue with acetaminophen for pain as well and Methacarbamol -Antiemetics with 4 mg po/IV Ondansetron q6hprn -Further care per General Surgery  Diabetes mellitus type II, non insulin dependent -Diet controlled. Blood sugar today was 101.  -Monitor blood sugars and Started on Sensitive Novolog SSI AC/HS as needed for glycemic control. -Check HgbA1c and was 6.8 -Continue to Monitor Blood Sugars carefully; CBGs ranging from   Leukocytosis -In the setting of Infection and Abscess -Abx As above -WBC is trending down and went from 23.1 -> 21.4 -Repeat CBC in the AM   Tobacco Abuse -Smoking Cessastion Counseling given  -Nicotine patch provided to prevent withdrawls.  Obesity (BMI 30-39.9) -Complicates overall prognosis and care -Estimated body mass index is 34.51 kg/m as calculated from the following:   Height as of this encounter: 5\' 9"  (1.753 m).   Weight as of this encounter: 106 kg.  -He has History of frequent  abscesses of skin and subcutaneous tissue -Weight Loss and Dietary Counseling given  We will continue to Monitor the patient's clinical response to intervention and follow specialist recommendations and repeat blood work in the AM

## 2020-07-13 NOTE — ED Notes (Signed)
ED TO INPATIENT HANDOFF REPORT  Name/Age/Gender Martin Crawford 45 y.o. male  Code Status Code Status History    Date Active Date Inactive Code Status Order ID Comments User Context   09/23/2016 0142 09/25/2016 1803 Full Code [id number]  Almond Lint, MD Inpatient   09/17/2016 0331 09/18/2016 2033 Full Code [id number]  Lorretta Harp, MD Inpatient   Advance Care Planning Activity    Questions for Most Recent Historical Code Status (Order [id number])       Home/SNF/Other Home  Chief Complaint Perineal abscess [L02.215]  Level of Care/Admitting Diagnosis ED Disposition    ED Disposition Condition Comment   Admit  Hospital Area: [facility] [[id number]]  Level of Care: Med-Surg [16]  May admit patient to [facility] or [facility] if equivalent level of care is available:: Yes  Covid Evaluation: Asymptomatic Screening Protocol (No Symptoms)  Diagnosis: Perineal abscess [102585]  Admitting Physician: Carlton Adam [[id number]]  Attending Physician: Carlton Adam [[id number]]  Estimated length of stay: past midnight tomorrow  Certification:: I certify this patient will need inpatient services for at least 2 midnights       Medical History Past Medical History:  Diagnosis Date  . Asthma   . Chronic back pain     Allergies No Known Allergies  IV Location/Drains/Wounds Patient Lines/Drains/Airways Status    Active Line/Drains/Airways    Name Placement date Placement time Site Days   Peripheral IV 07/12/20 Left Antecubital 07/12/20  2259  Antecubital  1   Peripheral IV 07/13/20 Right Hand 07/13/20  0214  Hand  less than 1   Incision (Closed) 09/17/16 Buttocks 09/17/16  0300  -- 1395   Incision (Closed) 09/23/16 Rectum 09/23/16  1214  -- 1389          Labs/Imaging Results for orders placed or performed during the hospital encounter of 07/12/20 (from the past 48 hour(s))  Lactic acid, plasma     Status: None   Collection Time: 07/12/20 10:55 PM  Result  Value Ref Range   Lactic Acid, Venous 1.7 0.5 - 1.9 mmol/L    Comment: Performed at [facility], [address], [place], [place] [address]  Comprehensive metabolic panel     Status: Abnormal   Collection Time: 07/12/20 10:55 PM  Result Value Ref Range   Sodium 137 135 - 145 mmol/L   Potassium 3.5 3.5 - 5.1 mmol/L   Chloride 105 98 - 111 mmol/L   CO2 25 22 - 32 mmol/L   Glucose, Bld 101 (H) 70 - 99 mg/dL    Comment: Glucose reference range applies only to samples taken after fasting for at least 8 hours.   BUN 7 6 - 20 mg/dL   Creatinine, Ser 0.86 0.61 - 1.24 mg/dL   Calcium 9.2 8.9 - 76.1 mg/dL   Total Protein 7.9 6.5 - 8.1 g/dL   Albumin 3.7 3.5 - 5.0 g/dL   AST 15 15 - 41 U/L   ALT 14 0 - 44 U/L   Alkaline Phosphatase 89 38 - 126 U/L   Total Bilirubin 0.8 0.3 - 1.2 mg/dL   GFR, Estimated >95 >09 mL/min    Comment: (NOTE) Calculated using the CKD-EPI Creatinine Equation (2021)    Anion gap 7 5 - 15    Comment: Performed at [facility], [address] [place], [place] [address]  CBC WITH DIFFERENTIAL     Status: Abnormal   Collection Time: 07/12/20 10:55 PM  Result Value Ref  Range   WBC 23.1 (H) 4.0 - 10.5 K/uL   RBC 7.51 (H) 4.22 - 5.81 MIL/uL   Hemoglobin 15.9 13.0 - 17.0 g/dL   HCT 02.7 25.3 - 66.4 %   MCV 63.5 (L) 80.0 - 100.0 fL   MCH 21.2 (L) 26.0 - 34.0 pg   MCHC 33.3 30.0 - 36.0 g/dL   RDW 40.3 (H) 47.4 - 25.9 %   Platelets 222 150 - 400 K/uL   nRBC 0.0 0.0 - 0.2 %   Neutrophils Relative % 79 %   Neutro Abs 18.1 (H) 1.7 - 7.7 K/uL   Lymphocytes Relative 13 %   Lymphs Abs 3.0 0.7 - 4.0 K/uL   Monocytes Relative 6 %   Monocytes Absolute 1.5 (H) 0.1 - 1.0 K/uL   Eosinophils Relative 1 %   Eosinophils Absolute 0.3 0.0 - 0.5 K/uL   Basophils Relative 0 %   Basophils Absolute 0.1 0.0 - 0.1 K/uL   Immature Granulocytes 1 %   Abs Immature Granulocytes 0.12 (H) 0.00 - 0.07 K/uL    Comment: Performed at [facility], [address] [place], [place] [address]  Protime-INR     Status: None   Collection Time: 07/12/20 10:55 PM  Result Value Ref Range   Prothrombin Time 13.9 11.4 - 15.2 seconds   INR 1.1 0.8 - 1.2    Comment: (NOTE) INR goal varies based on device and disease states. Performed at [facility], [address], [place], [place] [address]   APTT     Status: None   Collection Time: 07/12/20 10:55 PM  Result Value Ref Range   aPTT 28 24 - 36 seconds    Comment: Performed at [facility], [address], [place], [place] [address]  Lactic acid, plasma     Status: None   Collection Time: 07/13/20  1:50 AM  Result Value Ref Range   Lactic Acid, Venous 1.2 0.5 - 1.9 mmol/L    Comment: Performed at [facility], [address] [place], [place] [address]   CT PELVIS W CONTRAST  Result Date: 07/13/2020 CLINICAL DATA:  Perirectal abscess EXAM: CT PELVIS WITH CONTRAST TECHNIQUE: Multidetector CT imaging of the pelvis was performed using the standard protocol following the bolus administration of intravenous contrast. CONTRAST:  OMNIPAQUE IOHEXOL 300 MG/ML  SOLN COMPARISON:  Sep 17, 2016 FINDINGS: Urinary Tract: The visualized distal ureters and bladder appear unremarkable. Bowel: No bowel wall thickening, distention or surrounding inflammation identified within the pelvis. Vascular/Lymphatic: No enlarged pelvic lymph nodes identified. No significant vascular findings. Reproductive: The prostate is unremarkable. Other: Within the right posterior perineal soft tissues there is a small multilocular fluid collection measuring 4.1 x 1.1 by 3.2 cm surrounding skin thickening and subcutaneous edema is noted. No subcutaneous emphysema is seen. A right inguinal fat containing hernia is seen. There is scattered right inguinal lymph nodes, the largest measuring 1.7 cm Musculoskeletal: No acute or significant osseous findings. IMPRESSION: Soft  tissue abscess within the posterior right perineal soft tissues measuring 4.1 x 1.1 x 3.2 cm. No subcutaneous emphysema is seen. Right inguinal reactive lymphadenopathy Electronically Signed   By: Jonna Clark M.D.   On: 07/13/2020 00:55    Pending Labs Unresulted Labs (From admission, onward)          Start     Ordered   07/13/20 0150  Culture, blood (single)  (Undifferentiated -> Now sepsis confirmed (treatment and sepsis specific nursing  orders))  ONCE - STAT,   STAT        07/13/20 0150   07/12/20 2218  SARS CORONAVIRUS 2 (TAT 6-24 HRS) Nasopharyngeal Nasopharyngeal Swab  (Tier 3 - Symptomatic/asymptomatic with Precautions)  ONCE - STAT,   STAT       Question Answer Comment  Is this test for diagnosis or screening Screening   Symptomatic for COVID-19 as defined by CDC No   Hospitalized for COVID-19 No   Admitted to ICU for COVID-19 No   Previously tested for COVID-19 No   Resident in a congregate (group) care setting No   Employed in healthcare setting No   Has patient completed COVID vaccination(s) (2 doses of Pfizer/Moderna 1 dose of [employer]) No      07/12/20 2217   07/12/20 2216  Blood culture (routine single)  (Undifferentiated presentation (screening labs and basic nursing orders))  ONCE - STAT,   STAT        07/12/20 2216   07/12/20 2216  Urinalysis, Routine w reflex microscopic  (Undifferentiated presentation (screening labs and basic nursing orders))  ONCE - STAT,   STAT        07/12/20 2216   07/12/20 2216  Urine culture  (Undifferentiated presentation (screening labs and basic nursing orders))  ONCE - STAT,   STAT        07/12/20 2216          Vitals/Pain Today's Vitals   07/13/20 0130 07/13/20 0143 07/13/20 0200 07/13/20 0300  BP: (!) 142/83  (!) 137/91 132/85  Pulse: 79  75 70  Resp: (!) 22  20 20   Temp:    98.1 F (36.7 C)  TempSrc:    Oral  SpO2: 93%  91% 93%  Weight:      Height:      PainSc:  0-No pain      Isolation Precautions Airborne  and Contact precautions  Medications Medications  lactated ringers infusion ( Intravenous Stopped 07/13/20 0213)  lactated ringers infusion ( Intravenous New Bag/Given 07/13/20 0215)  lactated ringers bolus 1,000 mL (1,000 mLs Intravenous New Bag/Given 07/13/20 0215)    And  lactated ringers bolus 1,000 mL (1,000 mLs Intravenous New Bag/Given 07/13/20 0224)    And  lactated ringers bolus 200 mL (has no administration in time range)  vancomycin (VANCOREADY) IVPB 2000 mg/400 mL (has no administration in time range)  morphine 4 MG/ML injection 4 mg (4 mg Intravenous Given 07/12/20 2355)  iohexol (OMNIPAQUE) 300 MG/ML solution 100 mL (100 mLs Intravenous Contrast Given 07/13/20 0035)  cefTRIAXone (ROCEPHIN) 2 g in sodium chloride 0.9 % 100 mL IVPB (2 g Intravenous New Bag/Given 07/13/20 0220)    Mobility walks with person assist

## 2020-07-13 NOTE — Progress Notes (Signed)
Pharmacy Antibiotic Note  Martin Crawford is a 45 y.o. male admitted on 07/12/2020 with hx of pilonidal abscess and sepsis requiring surgical drainage approximately 1.5 years ago presents to the ED complaining of gradual, persistent, progressively worsening perirectal abscess onset 5 days ago.  Pharmacy has been consulted for vancomycin dosing for cellulitis.  Plan: Vancomycin 2gm IV x1 then 1500mg  q12h (AUC 489.9, Scr 0.81) Follow renal function, cultures and clinical course  Height: 5\' 9"  (175.3 cm) Weight: 117.9 kg (260 lb) IBW/kg (Calculated) : 70.7  Temp (24hrs), Avg:98.6 F (37 C), Min:98.6 F (37 C), Max:98.6 F (37 C)  Recent Labs  Lab 07/12/20 2255  WBC 23.1*  CREATININE 0.81  LATICACIDVEN 1.7    Estimated Creatinine Clearance: 149 mL/min (by C-G formula based on SCr of 0.81 mg/dL).    No Known Allergies  Antimicrobials this admission: 3/25 CTX x1 3/25 vancomycin >>  Dose adjustments this admission:   Microbiology results: 3/24 BCx:  3/24 UCx:  Thank you for allowing pharmacy to be a part of this patient's care.  4/24 RPh 07/13/2020, 2:14 AM

## 2020-07-13 NOTE — Anesthesia Preprocedure Evaluation (Addendum)
Anesthesia Evaluation  Patient identified by MRN, date of birth, ID band Patient awake    Reviewed: Allergy & Precautions, NPO status , Patient's Chart, lab work & pertinent test results  History of Anesthesia Complications Negative for: history of anesthetic complications  Airway Mallampati: II  TM Distance: >3 FB Neck ROM: Full    Dental  (+) Poor Dentition   Pulmonary asthma , Current Smoker and Patient abstained from smoking.,    Pulmonary exam normal        Cardiovascular negative cardio ROS Normal cardiovascular exam     Neuro/Psych negative neurological ROS     GI/Hepatic negative GI ROS, Neg liver ROS,   Endo/Other  diabetes, Type 2, Oral Hypoglycemic Agents  Renal/GU negative Renal ROS  negative genitourinary   Musculoskeletal negative musculoskeletal ROS (+)   Abdominal   Peds  Hematology negative hematology ROS (+)   Anesthesia Other Findings  Perineal abscess  Reproductive/Obstetrics                            Anesthesia Physical Anesthesia Plan  ASA: II  Anesthesia Plan: MAC   Post-op Pain Management:    Induction: Intravenous  PONV Risk Score and Plan: 1 and Propofol infusion, TIVA and Treatment may vary due to age or medical condition  Airway Management Planned: Natural Airway, Nasal Cannula and Simple Face Mask  Additional Equipment: None  Intra-op Plan:   Post-operative Plan:   Informed Consent: I have reviewed the patients History and Physical, chart, labs and discussed the procedure including the risks, benefits and alternatives for the proposed anesthesia with the patient or authorized representative who has indicated his/her understanding and acceptance.       Plan Discussed with:   Anesthesia Plan Comments:        Anesthesia Quick Evaluation

## 2020-07-13 NOTE — Consult Note (Signed)
Consult Note  Martin Crawford 11/01/1975  [id number].    Requesting MD: Dr. Trey Paula Chief Complaint/Reason for Consult: perineal abscess  HPI:  Patient is a 45 year old male who presented to [facility] with R buttock pain and swelling for several days. Reports the area started spontaneously draining on arrival to the ED. Feels slightly improved since it started draining some. Has a hx of pilonidal abscess and other soft tissue abscesses. Denied fever, chills, chest pain, SOB, abdominal pain, nausea, vomiting, diarrhea, urinary symptoms. PMH otherwise significant for pre-diabetes, asthma, chronic back pain and chronic R neck cyst. He is not on any blood thinning medications at home. NKDA. He would like abscess drained in the OR. Of note he also reports occasional bleeding after BMs, he has not seen anyone for this yet.   ROS: Review of Systems  Constitutional: Negative for chills and fever.  Respiratory: Negative for shortness of breath and wheezing.   Cardiovascular: Negative for chest pain and palpitations.  Gastrointestinal: Positive for blood in stool. Negative for abdominal pain, constipation, diarrhea, nausea and vomiting.  Genitourinary: Negative for dysuria, frequency and urgency.       R buttock pain and swelling with drainage  All other systems reviewed and are negative.   Family History  Problem Relation Age of Onset  . Stroke Mother   . Hypertension Mother   . Asthma Father   . Hypertension Father     Past Medical History:  Diagnosis Date  . Asthma   . Chronic back pain   . Diabetes mellitus type II, non insulin dependent (HCC) 07/13/2020  . History of frequent abscesses of skin and subcutaneous tissue 07/13/2020  . Pilonidal abscess 09/22/2016    Past Surgical History:  Procedure Laterality Date  . INCISION AND DRAINAGE ABSCESS N/A 09/23/2016   Procedure: INCISION AND DRAINAGE ABSCESS PILONIDAL ABCESS;  Surgeon: Glenna Fellows, MD;  Location: [facility];   Service: General;  Laterality: N/A;  . INCISION AND DRAINAGE ABSCESS  08/02/2013   buttock abscess - I&D in ED  . PILONIDAL CYST DRAINAGE  09/23/2016   pilonidal abscess    Social History:  reports that he has been smoking cigarettes. He has a 28.00 pack-year smoking history. He has never used smokeless tobacco. He reports that he does not drink alcohol and does not use drugs.  Allergies: No Known Allergies  Medications Prior to Admission  Medication Sig Dispense Refill  . acetaminophen (TYLENOL) 500 MG tablet Take 2,000 mg by mouth every 6 (six) hours as needed for moderate pain.    . metFORMIN (GLUCOPHAGE) 500 MG tablet Take 1 tablet (500 mg total) by mouth 2 (two) times daily with a meal. (Patient not taking: Reported on 07/13/2020) 180 tablet 3    Blood pressure (!) 144/100, pulse 69, temperature 98.9 F (37.2 C), temperature source Oral, resp. rate (!) 24, height 5\' 9"  (1.753 m), weight 106 kg, SpO2 93 %. Physical Exam:  General: pleasant, WD, WN male who is laying in bed in NAD HEENT: head is normocephalic, atraumatic.  Sclera are anicteric.  PERRL.  Ears and nose without any masses or lesions.  Mouth is pink and moist Heart: regular, rate, and rhythm.  Normal s1,s2. No obvious murmurs, gallops, or rubs noted.  Palpable radial and pedal pulses bilaterally Lungs: CTAB, no wheezes, rhonchi, or rales noted.  Respiratory effort nonlabored Abd: soft, NT, ND, +BS, no masses, hernias, or organomegaly GU: induration and erythema of R inferior buttock, unable  to express any drainage on my exam but limited by pain  MS: all 4 extremities are symmetrical with no cyanosis, clubbing, or edema. Skin: warm and dry with no masses, lesions, or rashes Neuro: Cranial nerves 2-12 grossly intact, sensation is normal throughout Psych: A&Ox3 with an appropriate affect.   Results for orders placed or performed during the hospital encounter of 07/12/20 (from the past 48 hour(s))  Urinalysis, Routine w  reflex microscopic     Status: Abnormal   Collection Time: 07/12/20  3:02 AM  Result Value Ref Range   Color, Urine YELLOW YELLOW   APPearance CLEAR CLEAR   Specific Gravity, Urine 1.043 (H) 1.005 - 1.030   pH 6.0 5.0 - 8.0   Glucose, UA NEGATIVE NEGATIVE mg/dL   Hgb urine dipstick NEGATIVE NEGATIVE   Bilirubin Urine NEGATIVE NEGATIVE   Ketones, ur NEGATIVE NEGATIVE mg/dL   Protein, ur NEGATIVE NEGATIVE mg/dL   Nitrite NEGATIVE NEGATIVE   Leukocytes,Ua NEGATIVE NEGATIVE    Comment: Performed at [facility], [address] [place], [place] [address]  SARS CORONAVIRUS 2 (TAT 6-24 HRS) Nasopharyngeal Nasopharyngeal Swab     Status: None   Collection Time: 07/12/20 10:18 PM   Specimen: Nasopharyngeal Swab  Result Value Ref Range   SARS Coronavirus 2 NEGATIVE NEGATIVE    Comment: (NOTE) SARS-CoV-2 target nucleic acids are NOT DETECTED.  The SARS-CoV-2 RNA is generally detectable in upper and lower respiratory specimens during the acute phase of infection. Negative results do not preclude SARS-CoV-2 infection, do not rule out co-infections with other pathogens, and should not be used as the sole basis for treatment or other patient management decisions. Negative results must be combined with clinical observations, patient history, and epidemiological information. The expected result is Negative.  Fact Sheet for Patients: HairSlick.no  Fact Sheet for Healthcare Providers: quierodirigir.com  This test is not yet approved or cleared by the [place] FDA and  has been authorized for detection and/or diagnosis of SARS-CoV-2 by FDA under an Emergency Use Authorization (EUA). This EUA will remain  in effect (meaning this test can be used) for the duration of the COVID-19 declaration under Se ction 564(b)(1) of the Act, 21 U.S.C. section 360bbb-3(b)(1), unless the authorization is terminated or revoked  sooner.  Performed at [facility], [address], [place], [place] [address]   Lactic acid, plasma     Status: None   Collection Time: 07/12/20 10:55 PM  Result Value Ref Range   Lactic Acid, Venous 1.7 0.5 - 1.9 mmol/L    Comment: Performed at [facility], [address] [place], [place] [address]  Comprehensive metabolic panel     Status: Abnormal   Collection Time: 07/12/20 10:55 PM  Result Value Ref Range   Sodium 137 135 - 145 mmol/L   Potassium 3.5 3.5 - 5.1 mmol/L   Chloride 105 98 - 111 mmol/L   CO2 25 22 - 32 mmol/L   Glucose, Bld 101 (H) 70 - 99 mg/dL    Comment: Glucose reference range applies only to samples taken after fasting for at least 8 hours.   BUN 7 6 - 20 mg/dL   Creatinine, Ser 4.17 0.61 - 1.24 mg/dL   Calcium 9.2 8.9 - 40.8 mg/dL   Total Protein 7.9 6.5 - 8.1 g/dL   Albumin 3.7 3.5 - 5.0 g/dL   AST 15 15 - 41 U/L   ALT 14 0 - 44 U/L   Alkaline Phosphatase 89  38 - 126 U/L   Total Bilirubin 0.8 0.3 - 1.2 mg/dL   GFR, Estimated >16>60 >10>60 mL/min    Comment: (NOTE) Calculated using the CKD-EPI Creatinine Equation (2021)    Anion gap 7 5 - 15    Comment: Performed at [facility], [address] [place], [place] [address]  CBC WITH DIFFERENTIAL     Status: Abnormal   Collection Time: 07/12/20 10:55 PM  Result Value Ref Range   WBC 23.1 (H) 4.0 - 10.5 K/uL   RBC 7.51 (H) 4.22 - 5.81 MIL/uL   Hemoglobin 15.9 13.0 - 17.0 g/dL   HCT 40.947.7 81.139.0 - 91.452.0 %   MCV 63.5 (L) 80.0 - 100.0 fL   MCH 21.2 (L) 26.0 - 34.0 pg   MCHC 33.3 30.0 - 36.0 g/dL   RDW 78.218.7 (H) 95.611.5 - 21.315.5 %   Platelets 222 150 - 400 K/uL   nRBC 0.0 0.0 - 0.2 %   Neutrophils Relative % 79 %   Neutro Abs 18.1 (H) 1.7 - 7.7 K/uL   Lymphocytes Relative 13 %   Lymphs Abs 3.0 0.7 - 4.0 K/uL   Monocytes Relative 6 %   Monocytes Absolute 1.5 (H) 0.1 - 1.0 K/uL   Eosinophils Relative 1 %   Eosinophils Absolute 0.3 0.0 - 0.5 K/uL   Basophils Relative 0  %   Basophils Absolute 0.1 0.0 - 0.1 K/uL   Immature Granulocytes 1 %   Abs Immature Granulocytes 0.12 (H) 0.00 - 0.07 K/uL    Comment: Performed at [facility], [address] [place], [place] [address]  Protime-INR     Status: None   Collection Time: 07/12/20 10:55 PM  Result Value Ref Range   Prothrombin Time 13.9 11.4 - 15.2 seconds   INR 1.1 0.8 - 1.2    Comment: (NOTE) INR goal varies based on device and disease states. Performed at [facility], [address] [place], [place] [address]   APTT     Status: None   Collection Time: 07/12/20 10:55 PM  Result Value Ref Range   aPTT 28 24 - 36 seconds    Comment: Performed at [facility], [address] [place], [place] [address]  Lactic acid, plasma     Status: None   Collection Time: 07/13/20  1:50 AM  Result Value Ref Range   Lactic Acid, Venous 1.2 0.5 - 1.9 mmol/L    Comment: Performed at [facility], [address] [place], [place] [address]  MRSA PCR Screening     Status: None   Collection Time: 07/13/20  3:10 AM   Specimen: Nasopharyngeal  Result Value Ref Range   MRSA by PCR NEGATIVE NEGATIVE    Comment:        The GeneXpert MRSA Assay (FDA approved for NASAL specimens only), is one component of a comprehensive MRSA colonization surveillance program. It is not intended to diagnose MRSA infection nor to guide or monitor treatment for MRSA infections. Performed at [facility], [address] [place], [place] [address]   Glucose, capillary     Status: Abnormal   Collection Time: 07/13/20  4:20 AM  Result Value Ref Range   Glucose-Capillary 107 (H) 70 - 99 mg/dL    Comment: Glucose reference range applies only to samples taken after fasting for at least 8 hours.  CBC     Status: Abnormal   Collection Time: 07/13/20  6:01 AM  Result Value Ref Range   WBC 21.4 (H) 4.0 - 10.5 K/uL   RBC 6.94 (H) 4.22 - 5.81  MIL/uL   Hemoglobin 14.8 13.0 - 17.0 g/dL   HCT 40.9 81.1 - 91.4 %   MCV 63.8 (L) 80.0 - 100.0 fL   MCH 21.3 (L) 26.0 - 34.0 pg   MCHC 33.4 30.0 - 36.0 g/dL   RDW 78.2 (H) 95.6 - 21.3 %   Platelets 212 150 - 400 K/uL   nRBC 0.0 0.0 - 0.2 %    Comment: Performed at [facility], [address] [place], [place] [address]  Basic metabolic panel     Status: Abnormal   Collection Time: 07/13/20  6:01 AM  Result Value Ref Range   Sodium 138 135 - 145 mmol/L   Potassium 3.7 3.5 - 5.1 mmol/L   Chloride 105 98 - 111 mmol/L   CO2 25 22 - 32 mmol/L   Glucose, Bld 104 (H) 70 - 99 mg/dL    Comment: Glucose reference range applies only to samples taken after fasting for at least 8 hours.   BUN <5 (L) 6 - 20 mg/dL   Creatinine, Ser 8.46 0.61 - 1.24 mg/dL   Calcium 8.9 8.9 - 96.2 mg/dL   GFR, Estimated >95 >28 mL/min    Comment: (NOTE) Calculated using the CKD-EPI Creatinine Equation (2021)    Anion gap 8 5 - 15    Comment: Performed at [facility], [address], [place], [place] [address]  Glucose, capillary     Status: Abnormal   Collection Time: 07/13/20  7:51 AM  Result Value Ref Range   Glucose-Capillary 122 (H) 70 - 99 mg/dL    Comment: Glucose reference range applies only to samples taken after fasting for at least 8 hours.   CT PELVIS W CONTRAST  Result Date: 07/13/2020 CLINICAL DATA:  Perirectal abscess EXAM: CT PELVIS WITH CONTRAST TECHNIQUE: Multidetector CT imaging of the pelvis was performed using the standard protocol following the bolus administration of intravenous contrast. CONTRAST:  OMNIPAQUE IOHEXOL 300 MG/ML  SOLN COMPARISON:  Sep 17, 2016 FINDINGS: Urinary Tract: The visualized distal ureters and bladder appear unremarkable. Bowel: No bowel wall thickening, distention or surrounding inflammation identified within the pelvis. Vascular/Lymphatic: No enlarged pelvic lymph nodes identified. No significant vascular findings.  Reproductive: The prostate is unremarkable. Other: Within the right posterior perineal soft tissues there is a small multilocular fluid collection measuring 4.1 x 1.1 by 3.2 cm surrounding skin thickening and subcutaneous edema is noted. No subcutaneous emphysema is seen. A right inguinal fat containing hernia is seen. There is scattered right inguinal lymph nodes, the largest measuring 1.7 cm Musculoskeletal: No acute or significant osseous findings. IMPRESSION: Soft tissue abscess within the posterior right perineal soft tissues measuring 4.1 x 1.1 x 3.2 cm. No subcutaneous emphysema is seen. Right inguinal reactive lymphadenopathy Electronically Signed   By: Jonna Clark M.D.   On: 07/13/2020 00:55      Assessment/Plan Chronic back pain Hx of pre-diabetes Asthma R neck cyst - OP follow up, can have removed in the office if desired Blood in stools - sounds hemorrhoidal in nature, hgb 14.8, can follow up with GI as an OP  Perineal abscess - small and fairly superficial, spontaneously draining some - however patient will not tolerate bedside procedure secondary to pain - WBC 21.4 - to OR today for I&D  FEN: NPO, IVF VTE: lovenox ID: [email or web address]  Juliet Rude, PA-C  [facility] 07/13/2020, 9:39 AM Please see Amion for pager number during day hours 7:00am-4:30pm

## 2020-07-13 NOTE — Transfer of Care (Signed)
Immediate Anesthesia Transfer of Care Note  Patient: BILL YOHN  Procedure(s) Performed: Jasmine December UNDER ANESTHESIA/EXCISION AND DRAINAGE PERINEAL ABSCESS (N/A )  Patient Location: PACU  Anesthesia Type:MAC  Level of Consciousness: awake and oriented  Airway & Oxygen Therapy: Patient Spontanous Breathing and Patient connected to face mask oxygen  Post-op Assessment: Report given to RN and Post -op Vital signs reviewed and stable  Post vital signs: Reviewed and stable  Last Vitals:  Vitals Value Taken Time  BP 136/91 07/13/20 1330  Temp    Pulse 82 07/13/20 1332  Resp 44 07/13/20 1332  SpO2 96 % 07/13/20 1332  Vitals shown include unvalidated device data.  Last Pain:  Vitals:   07/13/20 1137  TempSrc: Oral  PainSc: 0-No pain         Complications: No complications documented.

## 2020-07-13 NOTE — Op Note (Signed)
Date: 07/13/20  Patient: Martin Crawford MRN: [id number]  Preoperative Diagnosis: Right gluteal abscess Postoperative Diagnosis: Same  Procedure: Incision and drainage of right gluteal abscess  Surgeon: Michaelle Birks, MD  EBL: Minimal  Anesthesia: MAC  Specimens: Culture of right gluteal abscess  Indications: Mr. Cheek is a 45 yo male who presented to the ED with right buttock pain and swelling for several days. CT scan showed an abscess. He agreed to undergo operative incision and drainage today.  Findings: Right gluteal abscess, much of which had already spontaneously drained.  Procedure details: Informed consent was obtained in the preoperative area prior to the procedure. The patient was brought to the operating room and placed on the table in the lithotomy. Sedation was administered and perioperative antibiotics were administered per SCIP guidelines. The patient was prepped and draped in the usual sterile fashion. A pre-procedure timeout was taken verifying patient identity, surgical site and procedure to be performed.  There was an area of fluctuance on the right gluteus that had some spontaneous purulent drainage. This was incised with an 11-blade scalpel and the underlying cavity was probed. More purulent drainage was expressed, but much of it had spontaneously drained. Cultures were sent. The cavity did not track toward the rectum. The wound was packed with 1-inch packing strips and a clean dressing was applied.  The patient tolerated the procedure with no apparent complications. All counts were correct x2 at the end of the procedure. The patient was taken to PACU in stable condition.  Michaelle Birks, MD 07/13/20 2:02 PM

## 2020-07-13 NOTE — Progress Notes (Signed)
Sepsis tracking by eLINK 

## 2020-07-13 NOTE — H&P (Signed)
History and Physical    [name] KCL:[id number] DOB: June 10, 1975 DOA: 07/12/2020  PCP: Cain Saupe, MD (Inactive)   Patient coming from: Home  Chief Complaint: Boil on buttocks that is draining  HPI: Martin Crawford is a 45 y.o. male with medical history significant for pilonidal abscess, asthma, Diet controlled DM. He reports he had sepsis with a perirectal abscess that required surgical drainage approximately 1.5 years ago. He presents complaining of gradual, persistent, progressively worsening perirectal abscess that started 5 days ago. States he developed a boil on right lower buttock that enlarged over past few days and is associated with significant pain and swelling in the perirectal region.  Nothing makes it better and sitting and walking makes it worse.  Pt denies fever, chills, headache, neck pain, chest pain, shortness of breath, abdominal pain, nausea, vomiting, diarrhea, weakness, dizziness, syncope. He reports that abscess started to spontaneously drain.  Records reviewed: Patient was admitted on 09/22/2016 with enlarging, recurrent pilonidal abscess which required incision and drainage by Dr. Jeanella Flattery.  He was previously admitted for sepsis secondary to the pilonidal in the    ED Course: CT scan shows abscess in the right posterior gluteal soft tissue.  He has elevated WBC of 23,000  Review of Systems:  General: Denies fever, chills, weight loss, night sweats.  Denies dizziness.  HENT: Denies head trauma, headache, denies change in hearing, tinnitus.  Denies nasal  bleeding.  Denies sore throat, sores in mouth.  Denies difficulty swallowing Eyes: Denies blurry vision, pain in eye, drainage.  Denies discoloration of eyes. Neck: Denies pain.  Denies swelling.  Denies pain with movement. Cardiovascular: Denies chest pain, palpitations.  Denies edema.  Denies orthopnea Respiratory: Denies shortness of breath, cough.  Denies wheezing.  Denies sputum  production Gastrointestinal: Denies abdominal pain, swelling.  Denies nausea, vomiting, diarrhea.  Denies melena.  Denies hematemesis. Musculoskeletal: Denies limitation of movement.  Denies deformity or swelling.  Denies pain.  Denies arthralgias or myalgias. Genitourinary: Has boil on right perirectal region with drainage Denies urinary frequency or hesitancy.  Denies dysuria.  Skin: Denies rash.  Denies petechiae, purpura, ecchymosis. Neurological: Denies headache. Denies syncope. Denies seizure activity.  Denies slurred speech, drooping face.  Denies visual change. Psychiatric: Denies depression, anxiety. Denies hallucinations.  Past Medical History:  Diagnosis Date  . Asthma   . Chronic back pain   . Diabetes mellitus type II, non insulin dependent (HCC) 07/13/2020  . History of frequent abscesses of skin and subcutaneous tissue 07/13/2020  . Pilonidal abscess 09/22/2016    Past Surgical History:  Procedure Laterality Date  . INCISION AND DRAINAGE ABSCESS N/A 09/23/2016   Procedure: INCISION AND DRAINAGE ABSCESS PILONIDAL ABCESS;  Surgeon: Glenna Fellows, MD;  Location: [facility];  Service: General;  Laterality: N/A;  . INCISION AND DRAINAGE ABSCESS  08/02/2013   buttock abscess - I&D in ED  . PILONIDAL CYST DRAINAGE  09/23/2016   pilonidal abscess    Social History  reports that he has been smoking cigarettes. He has a 28.00 pack-year smoking history. He has never used smokeless tobacco. He reports that he does not drink alcohol and does not use drugs.  No Known Allergies  Family History  Problem Relation Age of Onset  . Stroke Mother   . Hypertension Mother   . Asthma Father   . Hypertension Father      Prior to Admission medications   Medication Sig Start Date End Date Taking? Authorizing Provider  acetaminophen (TYLENOL) 500 MG tablet  Take 2,000 mg by mouth every 6 (six) hours as needed for moderate pain.   Yes [provider]  metFORMIN (GLUCOPHAGE) 500 MG  tablet Take 1 tablet (500 mg total) by mouth 2 (two) times daily with a meal. Patient not taking: Reported on 07/13/2020 03/10/19   Cain Saupe, MD    Physical Exam: Vitals:   07/13/20 0057 07/13/20 0130 07/13/20 0200 07/13/20 0300  BP: (!) 140/97 (!) 142/83 (!) 137/91 132/85  Pulse: 79 79 75 70  Resp: (!) 22 (!) 22 20 20   Temp:    98.1 F (36.7 C)  TempSrc:    Oral  SpO2: 93% 93% 91% 93%  Weight:      Height:        Constitutional: NAD, calm, comfortable Vitals:   07/13/20 0057 07/13/20 0130 07/13/20 0200 07/13/20 0300  BP: (!) 140/97 (!) 142/83 (!) 137/91 132/85  Pulse: 79 79 75 70  Resp: (!) 22 (!) 22 20 20   Temp:    98.1 F (36.7 C)  TempSrc:    Oral  SpO2: 93% 93% 91% 93%  Weight:      Height:       General: WDWN, Alert and oriented x3.  Eyes: EOMI, PERRL, conjunctivae normal.  Sclera nonicteric HENT:  [place]/AT, external ears normal.  Nares patent without epistasis.  Mucous membranes are moist. Neck: Soft, normal range of motion, supple, no masses, no thyromegaly. Trachea midline Respiratory: clear to auscultation bilaterally, no wheezing, no crackles. Normal respiratory effort. No accessory muscle use.  Cardiovascular: Regular rate and rhythm, no murmurs / rubs / gallops. No extremity edema.  Abdomen: Soft, no tenderness, nondistended, no rebound or guarding.  No masses palpated. Bowel sounds normoactive. GU:  Right posterior perineal region with tender indurated area with purulent drainage. Musculoskeletal: FROM. no cyanosis. Normal muscle tone.  Skin: Warm, dry, intact no rashes, lesions, ulcers. No induration Neurologic: CN 2-12 grossly intact.  Normal speech.  Sensation intact. Strength 5/5 in all extremities.   Psychiatric: Normal judgment and insight.  Normal mood.    Labs on Admission: I have personally reviewed following labs and imaging studies  CBC: Recent Labs  Lab 07/12/20 2255  WBC 23.1*  NEUTROABS 18.1*  HGB 15.9  HCT 47.7  MCV 63.5*  PLT 222     Basic Metabolic Panel: Recent Labs  Lab 07/12/20 2255  NA 137  K 3.5  CL 105  CO2 25  GLUCOSE 101*  BUN 7  CREATININE 0.81  CALCIUM 9.2    GFR: Estimated Creatinine Clearance: 149 mL/min (by C-G formula based on SCr of 0.81 mg/dL).  Liver Function Tests: Recent Labs  Lab 07/12/20 2255  AST 15  ALT 14  ALKPHOS 89  BILITOT 0.8  PROT 7.9  ALBUMIN 3.7    Urine analysis:    Component Value Date/Time   COLORURINE YELLOW 07/12/2020 0302   APPEARANCEUR CLEAR 07/12/2020 0302   LABSPEC 1.043 (H) 07/12/2020 0302   PHURINE 6.0 07/12/2020 0302   GLUCOSEU NEGATIVE 07/12/2020 0302   HGBUR NEGATIVE 07/12/2020 0302   BILIRUBINUR NEGATIVE 07/12/2020 0302   KETONESUR NEGATIVE 07/12/2020 0302   PROTEINUR NEGATIVE 07/12/2020 0302   UROBILINOGEN 2.0 (H) 08/02/2013 0045   NITRITE NEGATIVE 07/12/2020 0302   LEUKOCYTESUR NEGATIVE 07/12/2020 0302    Radiological Exams on Admission: CT PELVIS W CONTRAST  Result Date: 07/13/2020 CLINICAL DATA:  Perirectal abscess EXAM: CT PELVIS WITH CONTRAST TECHNIQUE: Multidetector CT imaging of the pelvis was performed using the standard protocol following the  bolus administration of intravenous contrast. CONTRAST:  OMNIPAQUE IOHEXOL 300 MG/ML  SOLN COMPARISON:  Sep 17, 2016 FINDINGS: Urinary Tract: The visualized distal ureters and bladder appear unremarkable. Bowel: No bowel wall thickening, distention or surrounding inflammation identified within the pelvis. Vascular/Lymphatic: No enlarged pelvic lymph nodes identified. No significant vascular findings. Reproductive: The prostate is unremarkable. Other: Within the right posterior perineal soft tissues there is a small multilocular fluid collection measuring 4.1 x 1.1 by 3.2 cm surrounding skin thickening and subcutaneous edema is noted. No subcutaneous emphysema is seen. A right inguinal fat containing hernia is seen. There is scattered right inguinal lymph nodes, the largest measuring 1.7 cm  Musculoskeletal: No acute or significant osseous findings. IMPRESSION: Soft tissue abscess within the posterior right perineal soft tissues measuring 4.1 x 1.1 x 3.2 cm. No subcutaneous emphysema is seen. Right inguinal reactive lymphadenopathy Electronically Signed   By: Jonna Clark M.D.   On: 07/13/2020 00:55    EKG: Independently reviewed.  EKG shows normal sinus rhythm with LVH.  No acute ST elevation or depression.  QTc 466  Assessment/Plan Principal Problem:   Perineal abscess Mr. Cefalu is started on antibiotics with Rocephin and vancomycin.  Abscess is spontaneously draining purulent fluid.  We will have wound care evaluate in the morning.  Surgery was consulted and will evaluate if patient will need to go to the operating room for further debridement and I&D.  CT scan shows soft tissue abscess in the right posterior perineal region that is approximately 4 x 1 x 3 cm.   Active Problems:   Diabetes mellitus type II, non insulin dependent Diet controlled. Blood sugar today was 101. Monitor blood sugars and SSI as needed for glycemic control. Check HgbA1c.     Leukocytosis Check CBC in am    Tobacco abuse Nicotine patch provided to prevent withdrawls.    Obesity (BMI 30-39.9)   History of frequent abscesses of skin and subcutaneous tissue     DVT prophylaxis: Lovenox for DVT prophylaxis Code Status:   Full code Family Communication:  Tightness when held patient and his wife is at bedside.  Verbalized understanding and agree with plan.  Further recommendations to follow as clinically indicated Disposition Plan:   Patient is from:  Home  Anticipated DC to:  Home  Anticipated DC date:  Anticipate more than 2 midnight stay in the hospital to treat acute condition  Anticipated DC barriers: No barriers to discharge identified at this time  Consults called:  Surgery consulted by ER PA Admission status:  Inpatient   Claudean Severance Evangelos Paulino MD [facility]  How to contact  the [facility] Attending or Consulting provider 7A - 7P or covering provider during after hours 7P -7A, for this patient?   1. Check the care team in [facility] and look for a) attending/consulting [facility] provider listed and b) the [facility] team listed 2. Log into [email or web address] and use [place]'s universal password to access. If you do not have the password, please contact the hospital operator. 3. Locate the [facility] provider you are looking for under [facility] and page to a number that you can be directly reached. 4. If you still have difficulty reaching the provider, please page the [facility] (Director on Call) for the Hospitalists listed on amion for assistance.  07/13/2020, 3:28 AM

## 2020-07-13 NOTE — Discharge Instructions (Signed)
Anorectal Abscess An abscess is an infected area that contains a collection of pus. An anorectal abscess is an abscess that is near the opening of the anus or around the rectum. Without treatment, an anorectal abscess can become larger and cause other problems, such as a more serious body-wide infection or pain, especially during bowel movements. What are the causes? This condition is caused by plugged glands or an infection in one of these areas:  The anus.  The area between the anus and the scrotum in males or between the anus and the vagina in females (perineum). What increases the risk? The following factors may make you more likely to develop this condition:  Diabetes or inflammatory bowel disease.  Having a body defense system (immune system) that is weak.  Engaging in anal sex.  Having a sexually transmitted infection (STI).  Certain kinds of cancer, such as rectal carcinoma, leukemia, or lymphoma. What are the signs or symptoms? The main symptom of this condition is pain. The pain may be a throbbing pain that gets worse during bowel movements. Other symptoms include:  Swelling and redness in the area of the abscess. The redness may go beyond the abscess and appear as a red streak on the skin.  A visible, painful lump, or a lump that can be felt when touched.  Bleeding or pus-like discharge from the area.  Fever.  General weakness.  Constipation.  Diarrhea. How is this diagnosed? This condition is diagnosed based on your medical history and a physical exam of the affected area.  This may involve examining the rectal area with a gloved hand (digital rectal exam).  Sometimes, the health care provider needs to look into the rectum using a probe, scope, or imaging test.  For women, it may require a careful vaginal exam. How is this treated? Treatment for this condition may include:  Incision and drainage surgery. This involves making an incision over the abscess to  drain the pus.  Medicines, including antibiotic medicine, pain medicine, stool softeners, or laxatives. Follow these instructions at home: Medicines  Take over-the-counter and prescription medicines only as told by your health care provider.  If you were prescribed an antibiotic medicine, use it as told by your health care provider. Do not stop using the antibiotic even if you start to feel better.  Do not drive or use heavy machinery while taking prescription pain medicine. Wound care  If gauze was used in the abscess, follow instructions from your health care provider about removing or changing the gauze. It can usually be removed in 2-3 days.  Wash your hands with soap and water before you remove or change your gauze. If soap and water are not available, use hand sanitizer.  If one or more drains were placed in the abscess cavity, be careful not to pull at them. Your health care provider will tell you how long they need to remain in place.  Check your incision area every day for signs of infection. Check for: ? More redness, swelling, or pain. ? More fluid or blood. ? Warmth. ? Pus or a bad smell.   Managing pain, stiffness, and swelling  Take a sitz bath 3-4 times a day and after bowel movements. This will help reduce pain and swelling.  To relieve pain, try sitting: ? On a heating pad with the setting on low. ? On an inflatable donut-shaped cushion.  If directed, put ice on the affected area: ? Put ice in a plastic bag. ? Place  a towel between your skin and the bag. ? Leave the ice on for 20 minutes, 2-3 times a day.   General instructions  Follow any diet instructions given by your health care provider.  Keep all follow-up visits as told by your health care provider. This is important. Contact a health care provider if you have:  Bleeding from your incision.  Pain, swelling, or redness that does not improve or gets worse.  Trouble passing stool or  urine.  Symptoms that return after treatment. Get help right away if you:  Have problems moving or using your legs.  Have severe or increasing pain.  Have swelling in the affected area that suddenly gets worse.  Have a large increase in bleeding or passing of pus.  Develop chills or a fever. Summary  An anorectal abscess is an abscess that is near the opening of the anus or around the rectum. An abscess is an infected area that contains a collection of pus.  The main symptom of this condition is pain. It may be a throbbing pain that gets worse during bowel movements.  Treatment for an anorectal abscess may include surgery to drain the pus from the abscess. Medicines and sitz baths may also be a part of your treatment plan. This information is not intended to replace advice given to you by your health care provider. Make sure you discuss any questions you have with your health care provider. Document Revised: 05/14/2017 Document Reviewed: 05/14/2017 Elsevier Patient Education  2021 Elsevier Inc.   How to Take a Sitz Bath A sitz bath is a warm water bath that may be used to care for your rectum, genital area, or the area between your rectum and genitals (perineum). In a sitz bath, the water only comes up to your hips and covers your buttocks. A sitz bath may be done in a bathtub or with a portable sitz bath that fits over the toilet. Your health care provider may recommend a sitz bath to help:  Relieve pain and discomfort after delivering a baby.  Relieve pain and itching from hemorrhoids or anal fissures.  Relieve pain after certain surgeries.  Relax muscles that are sore or tight. How to take a sitz bath Take 3-4 sitz baths a day, or as many as told by your health care provider. Bathtub sitz bath To take a sitz bath in a bathtub: 1. Partially fill a bathtub with warm water. The water should be deep enough to cover your hips and buttocks when you are sitting in the  tub. 2. Follow your health care provider's instructions if you are told to put medicine in the water. 3. Sit in the water. Open the tub drain a little, and leave it open during your bath. 4. Turn on the warm water again, enough to replace the water that is draining out. Keep the water running throughout your bath. This helps keep the water at the right level and temperature. 5. Soak in the water for 15-20 minutes, or as long as told by your health care provider. 6. When you are done, be careful when you stand up. You may feel dizzy. 7. After the sitz bath, pat yourself dry. Do not rub your skin to dry it.   Over-the-toilet sitz bath To take a sitz bath with an over-the-toilet basin: 1. Follow the manufacturer's instructions. 2. Fill the basin with warm water. 3. Follow your health care provider's instructions if you were told to put medicine in the water. 4. Sit   on the seat. Make sure the water covers your buttocks and perineum. 5. Soak in the water for 15-20 minutes, or as long as told by your health care provider. 6. After the sitz bath, pat yourself dry. Do not rub your skin to dry it. 7. Clean and dry the basin between uses. 8. Discard the basin if it cracks, or according to the manufacturer's instructions.   Contact a health care provider if:  Your pain or itching gets worse. Do not continue with sitz baths if your symptoms get worse.  You have new symptoms. Do not continue with sitz baths until you talk with your health care provider. Summary  A sitz bath is a warm water bath in which the water only comes up to your hips and covers your buttocks.  A sitz bath may help relieve pain and discomfort after delivering a baby. It also may help with pain and itching from hemorrhoids or anal fissures, or pain after certain surgeries. It can also help to relax muscles that are sore or tight.  Take 3-4 sitz baths a day, or as many as told by your health care provider. Soak in the water for  15-20 minutes.  Do not continue with sitz baths if your symptoms get worse. This information is not intended to replace advice given to you by your health care provider. Make sure you discuss any questions you have with your health care provider. Document Revised: 12/22/2019 Document Reviewed: 12/22/2019 Elsevier Patient Education  2021 [employer].

## 2020-07-13 NOTE — ED Notes (Signed)
SBAR in chart, vitals up to date. Burt Ek, RN notified.

## 2020-07-13 NOTE — Consult Note (Signed)
WOC Nurse Consult Note: Reason for Consult:perineal abscess Wound type:infectious Pressure Injury POA: N/A Measurement: Per Admitting MD note, 4cm x 1cm x 3cm  Surgery has been consulted and is to evaluate for operative intervention this morning. Topical care guidance will be per that team. In the interim, NS wet-to-dry dressings, performed two to three times daily and PRN are indicated.  WOC nursing team will not follow, but will remain available to this patient, the nursing and medical teams.  Please re-consult if needed. Thanks, Ladona Mow, MSN, RN, GNP, [place]  Pager# [phone]

## 2020-07-13 NOTE — Anesthesia Postprocedure Evaluation (Signed)
Anesthesia Post Note  Patient: Martin Crawford  Procedure(s) Performed: Jasmine December UNDER ANESTHESIA/EXCISION AND DRAINAGE PERINEAL ABSCESS (N/A )     Patient location during evaluation: PACU Anesthesia Type: MAC Level of consciousness: awake and alert Pain management: pain level controlled Vital Signs Assessment: post-procedure vital signs reviewed and stable Respiratory status: spontaneous breathing, nonlabored ventilation, respiratory function stable and patient connected to nasal cannula oxygen Cardiovascular status: blood pressure returned to baseline and stable Postop Assessment: no apparent nausea or vomiting Anesthetic complications: no   No complications documented.  Last Vitals:  Vitals:   07/13/20 1330 07/13/20 1345  BP: (!) 136/91 127/80  Pulse: 81 85  Resp: 10 17  Temp: 36.5 C   SpO2: 97% 93%    Last Pain:  Vitals:   07/13/20 1345  TempSrc:   PainSc: 0-No pain                 Lidia Collum

## 2020-07-13 NOTE — Progress Notes (Signed)
Pharmacy Antibiotic Note  Martin Crawford is a 45 y.o. male with hx DM presented to the ED on 07/12/2020 with perirectal abscess. Plan is to take him to the OR on 3/25 for I&D.  He's currently on zosyn and vancomycin for infection.   Plan: - adjust vancomycin to 1250 mg IV q12h for est AUC 457 - continue zosyn 3.375 gm IV q8h (infuse over 4 hrs) - monitor renal function colsely  ________________________________________________  Height: 5\' 9"  (175.3 cm) Weight: 106 kg (233 lb 11 oz) IBW/kg (Calculated) : 70.7  Temp (24hrs), Avg:98.6 F (37 C), Min:98.1 F (36.7 C), Max:98.9 F (37.2 C)  Recent Labs  Lab 07/12/20 2255 07/13/20 0150 07/13/20 0601  WBC 23.1*  --  21.4*  CREATININE 0.81  --  0.72  LATICACIDVEN 1.7 1.2  --     Estimated Creatinine Clearance: 142.8 mL/min (by C-G formula based on SCr of 0.72 mg/dL).    No Known Allergies   Thank you for allowing pharmacy to be a part of this patient's care.  07/15/20 07/13/2020 10:30 AM

## 2020-07-14 ENCOUNTER — Encounter (HOSPITAL_COMMUNITY): Payer: Self-pay | Admitting: Surgery

## 2020-07-14 LAB — COMPREHENSIVE METABOLIC PANEL
ALT: 11 U/L (ref 0–44)
AST: 12 U/L — ABNORMAL LOW (ref 15–41)
Albumin: 3.4 g/dL — ABNORMAL LOW (ref 3.5–5.0)
Alkaline Phosphatase: 81 U/L (ref 38–126)
Anion gap: 9 (ref 5–15)
BUN: 11 mg/dL (ref 6–20)
CO2: 25 mmol/L (ref 22–32)
Calcium: 9 mg/dL (ref 8.9–10.3)
Chloride: 105 mmol/L (ref 98–111)
Creatinine, Ser: 0.9 mg/dL (ref 0.61–1.24)
GFR, Estimated: >60 mL/min (ref >60)
Glucose, Bld: 112 mg/dL — ABNORMAL HIGH (ref 70–99)
Potassium: 3.6 mmol/L (ref 3.5–5.1)
Sodium: 139 mmol/L (ref 135–145)
Total Bilirubin: 0.9 mg/dL (ref 0.3–1.2)
Total Protein: 7 g/dL (ref 6.5–8.1)

## 2020-07-14 LAB — CBC WITH DIFFERENTIAL/PLATELET
Abs Immature Granulocytes: 0.07 10*3/uL (ref 0.00–0.07)
Basophils Absolute: 0.1 10*3/uL (ref 0.0–0.1)
Basophils Relative: 0 %
Eosinophils Absolute: 0.5 10*3/uL (ref 0.0–0.5)
Eosinophils Relative: 3 %
HCT: 45.2 % (ref 39.0–52.0)
Hemoglobin: 14.9 g/dL (ref 13.0–17.0)
Immature Granulocytes: 0 %
Lymphocytes Relative: 19 %
Lymphs Abs: 2.9 10*3/uL (ref 0.7–4.0)
MCH: 21.1 pg — ABNORMAL LOW (ref 26.0–34.0)
MCHC: 33 g/dL (ref 30.0–36.0)
MCV: 63.9 fL — ABNORMAL LOW (ref 80.0–100.0)
Monocytes Absolute: 1.2 10*3/uL — ABNORMAL HIGH (ref 0.1–1.0)
Monocytes Relative: 8 %
Neutro Abs: 10.9 10*3/uL — ABNORMAL HIGH (ref 1.7–7.7)
Neutrophils Relative %: 70 %
Platelets: 217 10*3/uL (ref 150–400)
RBC: 7.07 MIL/uL — ABNORMAL HIGH (ref 4.22–5.81)
RDW: 18.2 % — ABNORMAL HIGH (ref 11.5–15.5)
WBC: 15.7 10*3/uL — ABNORMAL HIGH (ref 4.0–10.5)
nRBC: 0 % (ref 0.0–0.2)

## 2020-07-14 LAB — GLUCOSE, CAPILLARY
Glucose-Capillary: 113 mg/dL — ABNORMAL HIGH (ref 70–99)
Glucose-Capillary: 188 mg/dL — ABNORMAL HIGH (ref 70–99)

## 2020-07-14 LAB — PHOSPHORUS: Phosphorus: 4.4 mg/dL (ref 2.5–4.6)

## 2020-07-14 LAB — URINE CULTURE: Culture: NO GROWTH

## 2020-07-14 LAB — MAGNESIUM: Magnesium: 1.9 mg/dL (ref 1.7–2.4)

## 2020-07-14 MED ORDER — METHOCARBAMOL 500 MG PO TABS: 500.0000 mg | ORAL_TABLET | Freq: Four times a day (QID) | ORAL | 0 refills | Status: DC | PRN

## 2020-07-14 MED ORDER — ACETAMINOPHEN 500 MG PO TABS: 1000.0000 mg | ORAL_TABLET | Freq: Four times a day (QID) | ORAL | 0 refills | Status: DC

## 2020-07-14 MED ORDER — HYDROCORT-PRAMOXINE (PERIANAL) 2.5-1 % EX CREA: 1.0000 "application " | TOPICAL_CREAM | Freq: Four times a day (QID) | CUTANEOUS | 0 refills | Status: DC | PRN

## 2020-07-14 MED ORDER — ONDANSETRON HCL 4 MG PO TABS: 4.0000 mg | ORAL_TABLET | Freq: Four times a day (QID) | ORAL | 0 refills | Status: DC | PRN

## 2020-07-14 MED ORDER — GLUCERNA SHAKE PO LIQD: 237.0000 mL | Freq: Two times a day (BID) | ORAL | 0 refills | Status: DC

## 2020-07-14 MED ORDER — WITCH HAZEL-GLYCERIN EX PADS: 1.0000 "application " | MEDICATED_PAD | CUTANEOUS | 12 refills | Status: DC | PRN

## 2020-07-14 MED ORDER — NICOTINE 21 MG/24HR TD PT24: 21.0000 mg | MEDICATED_PATCH | Freq: Every day | TRANSDERMAL | 0 refills | Status: DC

## 2020-07-14 MED ORDER — PSYLLIUM 95 % PO PACK: 1.0000 | PACK | Freq: Two times a day (BID) | ORAL | 0 refills | Status: DC

## 2020-07-14 MED ORDER — OXYCODONE HCL 5 MG PO TABS: 5.0000 mg | ORAL_TABLET | Freq: Four times a day (QID) | ORAL | 0 refills | Status: DC | PRN

## 2020-07-14 MED ORDER — AMOXICILLIN-POT CLAVULANATE 875-125 MG PO TABS: 1.0000 | ORAL_TABLET | Freq: Two times a day (BID) | ORAL | 0 refills | Status: AC

## 2020-07-14 MED ORDER — DOXYCYCLINE HYCLATE 100 MG PO TBEC
100.0000 mg | DELAYED_RELEASE_TABLET | Freq: Two times a day (BID) | ORAL | 0 refills | Status: AC
Start: 2020-07-14 — End: 2020-07-21

## 2020-07-14 MED ORDER — LIP MEDEX EX OINT: 1.0000 "application " | TOPICAL_OINTMENT | Freq: Two times a day (BID) | CUTANEOUS | 0 refills | Status: DC

## 2020-07-14 NOTE — Progress Notes (Signed)
1 Day Post-Op   Subjective/Chief Complaint: Pt feels better Much less pain   Objective: Vital signs in last 24 hours: Temp:  [97.5 F (36.4 C)-98.1 F (36.7 C)] 97.6 F (36.4 C) (03/26 0519) Pulse Rate:  [62-85] 67 (03/26 0519) Resp:  [10-20] 20 (03/26 0519) BP: (125-147)/(80-105) 133/84 (03/26 0519) SpO2:  [93 %-97 %] 93 % (03/26 0519) Weight:  [105.5 kg-106 kg] 105.5 kg (03/26 0519) Last BM Date: 07/12/20  Intake/Output from previous day: 03/25 0701 - 03/26 0700 In: 840 [P.O.:240; I.V.:600] Out: -  Intake/Output this shift: No intake/output data recorded.  Exam: Awake and alert Abscess cavity packing removed, left open  Lab Results:  Recent Labs    07/13/20 0601 07/14/20 0636  WBC 21.4* 15.7*  HGB 14.8 14.9  HCT 44.3 45.2  PLT 212 217   BMET Recent Labs    07/13/20 0601 07/14/20 0636  NA 138 139  K 3.7 3.6  CL 105 105  CO2 25 25  GLUCOSE 104* 112*  BUN <5* 11  CREATININE 0.72 0.90  CALCIUM 8.9 9.0   PT/INR Recent Labs    07/12/20 2255  LABPROT 13.9  INR 1.1   ABG No results for input(s): PHART, HCO3 in the last 72 hours.  Invalid input(s): PCO2, PO2  Studies/Results: CT PELVIS W CONTRAST  Result Date: 07/13/2020 CLINICAL DATA:  Perirectal abscess EXAM: CT PELVIS WITH CONTRAST TECHNIQUE: Multidetector CT imaging of the pelvis was performed using the standard protocol following the bolus administration of intravenous contrast. CONTRAST:  OMNIPAQUE IOHEXOL 300 MG/ML  SOLN COMPARISON:  Sep 17, 2016 FINDINGS: Urinary Tract: The visualized distal ureters and bladder appear unremarkable. Bowel: No bowel wall thickening, distention or surrounding inflammation identified within the pelvis. Vascular/Lymphatic: No enlarged pelvic lymph nodes identified. No significant vascular findings. Reproductive: The prostate is unremarkable. Other: Within the right posterior perineal soft tissues there is a small multilocular fluid collection measuring 4.1 x 1.1  by 3.2 cm surrounding skin thickening and subcutaneous edema is noted. No subcutaneous emphysema is seen. A right inguinal fat containing hernia is seen. There is scattered right inguinal lymph nodes, the largest measuring 1.7 cm Musculoskeletal: No acute or significant osseous findings. IMPRESSION: Soft tissue abscess within the posterior right perineal soft tissues measuring 4.1 x 1.1 x 3.2 cm. No subcutaneous emphysema is seen. Right inguinal reactive lymphadenopathy Electronically Signed   By: Jonna Clark M.D.   On: 07/13/2020 00:55    Anti-infectives: Anti-infectives (From admission, onward)   Start     Dose/Rate Route Frequency Ordered Stop   07/13/20 1600  vancomycin (VANCOREADY) IVPB 1500 mg/300 mL  Status:  Discontinued        1,500 mg 150 mL/hr over 120 Minutes Intravenous Every 12 hours 07/13/20 0345 07/13/20 1038   07/13/20 1600  vancomycin (VANCOREADY) IVPB 1250 mg/250 mL        1,250 mg 166.7 mL/hr over 90 Minutes Intravenous Every 12 hours 07/13/20 1038     07/13/20 0500  cefTRIAXone (ROCEPHIN) 1 g in sodium chloride 0.9 % 100 mL IVPB  Status:  Discontinued        1 g 200 mL/hr over 30 Minutes Intravenous Every 24 hours 07/13/20 0406 07/13/20 0420   07/13/20 0400  piperacillin-tazobactam (ZOSYN) IVPB 3.375 g        3.375 g 12.5 mL/hr over 240 Minutes Intravenous Every 8 hours 07/13/20 0304     07/13/20 0200  vancomycin (VANCOCIN) IVPB 1000 mg/200 mL premix  Status:  Discontinued  1,000 mg 200 mL/hr over 60 Minutes Intravenous  Once 07/13/20 0150 07/13/20 0152   07/13/20 0200  cefTRIAXone (ROCEPHIN) 2 g in sodium chloride 0.9 % 100 mL IVPB        2 g 200 mL/hr over 30 Minutes Intravenous  Once 07/13/20 0150 07/13/20 0320   07/13/20 0200  vancomycin (VANCOREADY) IVPB 2000 mg/400 mL        2,000 mg 200 mL/hr over 120 Minutes Intravenous  Once 07/13/20 0152 07/13/20 0523      Assessment/Plan: s/p Procedure(s): EXAM UNDER ANESTHESIA/EXCISION AND DRAINAGE PERINEAL  ABSCESS (N/A)  Tolerated packing removal Only needs to cover with dry gauze as needed Ok to discharge from surgery standpoint.  F/u arranged. Would do antibiotics for 7 days.  ? augmentin   LOS: 1 day    Abigail Miyamoto 07/14/2020

## 2020-07-14 NOTE — Discharge Summary (Signed)
Physician Discharge Summary  Martin Crawford ASN:[id number] DOB: 1975-11-22 DOA: 07/12/2020  PCP: Cain Saupe, MD (Inactive)  Admit date: 07/12/2020 Discharge date: 07/14/2020  Admitted From: Home Disposition: Home  Recommendations for Outpatient Follow-up:  1. Follow up with PCP in 1-2 weeks 2. Follow up with General Surgery within 1-2 weeks 3. Please obtain CMP/CBC, Mag, Phos in one week 4. Please follow up on the following pending results: Abscess Cx Results  Home Health: No Equipment/Devices: None    Discharge Condition: Stable CODE STATUS: FULL CODE Diet recommendation: Heart Healthy Carb Modified Diet   Brief/Interim Summary: The patient is an obese African-American male with a past medical history significant for but not limited to pilonidal abscess, asthma, diet-controlled diabetes mellitus type 2, chronic back pain, history of frequent abscesses of the skin and subcutaneous tissue and other comorbidities who presented with a boil on his buttocks that was draining.  He states that he developed a boil on Sunday is right lower buttock that enlarged over the past 3 days associated with significant pain and swelling in the perirectal region.  He states that nothing makes it better and sitting and walking makes it worse.  He denied any other complaints reports that the abscess started to spontaneous drainage.  Back in 2018 he was admitted with an enlarging right current pilonidal abscess that required incision and drainage by Dr. Johna Sheriff.  He was previously admitted for sepsis secondary to the pilonidal cyst.  He presented to the ED and was worked up with a CT of the pelvis which showed a soft tissue abscess within the posterior right perineal soft tissues measuring 4.1 x 1.1 x 3.2 cm with no subcutaneous emphysema seen and right inguinal reactive lymphadenopathy.  General surgery was consulted as well as wound care and he has been been placed on IV antibiotics with IV vancomycin and IV  Zosyn.  He underwent operative intervention by Dr. Sophronia Simas and drainage of his right gluteal abscess that was sent for culture with culture sensitivities and specificity still pending.  He was changed to p.o. doxycycline and Augmentin at discharge at the ID recommendations and was deemed stable for discharge from surgery perspective.  Patient felt well and was ready to go home he will need to follow-up with PCP, general surgery and follow-up on his abscess culture results and continue antibiotics for 7 days.  Discharge Diagnoses:  Principal Problem:   Perineal abscess Active Problems:   Tobacco abuse   Obesity (BMI 30-39.9)   Diabetes mellitus type II, non insulin dependent (HCC)   History of frequent abscesses of skin and subcutaneous tissue  Sepsis secondary to Perineal Abscess, poA -Started on antibiotics with IV Rocephin and IV vancomycin but then his Rocephin has been escalated to IV Zosyn by surgery; continued on IV vancomycin and IV Zosyn while hospitalized and then changed to p.o. doxycycline and Augmentin at discharge -CT scan of the pelvis showed a soft tissue abscess within the posterior right perineal soft tissues measuring 4.1 x 1.1 x 3.2 cm with no subcutaneous emphysema seen and right inguinal reactive lymphadenopathy -Abscess was spontaneously draining and wound care evaluated but are deferring to surgery -General surgery has evaluated and planning on taking the patient to the OR for irrigation and debridement to ensure adequate drainage; cultures were sent -Patient was septic on admission and had a WBC of 23,100 and is now trended down to 21,400 and he presented with tachycardia and tachypnea with a source of infection; WBC further trended down to  15.7 -Sepsis protocol initiated in the ED and he was started on 2 L of lactated Ringer's boluses and then started on maintenance IV fluids -Pain control with oxycodone and IV morphine his pain was adequately controlled; will be  discharged on a short course of oxycodone -He has Rectal Creams with Hydrocortisone-Pramoxine 1 application RC; will need to follow-up with general surgery -Continue with acetaminophen for pain as well and Methacarbamol -Antiemetics with 4 mg po/IV Ondansetron q6hprn -Further care per General Surgery and he was deemed stable for discharge and packing was removed  Diabetes mellitus type II, non insulin dependent -Diet controlled. Blood sugar today was 101.  -Monitor blood sugars and Started on Sensitive Novolog SSI AC/HS as needed for glycemic control. -Check HgbA1c and was 6.8 -Continue to Monitor Blood Sugars carefully as an outpatient; G from 100-1 88  Leukocytosis -In the setting of Infection and Abscess -Abx As above -WBC is trending down and went from 23.1 -> 21.4 -> 15.7 -Repeat CBC in the AM   Tobacco Abuse -Smoking Cessastion Counseling given  -Nicotine patch provided to prevent withdrawls.  Obesity (BMI 30-39.9) -Complicates overall prognosis and care -Estimated body mass index is 34.33 kg/m as calculated from the following:   Height as of this encounter:  (1.753 m).   Weight as of this encounter: 105.5 kg. -He has History of frequent abscesses of skin and subcutaneous tissue -Weight Loss and Dietary Counseling given  Discharge Instructions  Discharge Instructions    Call MD for:  difficulty breathing, headache or visual disturbances   Complete by: As directed    Call MD for:  extreme fatigue   Complete by: As directed    Call MD for:  hives   Complete by: As directed    Call MD for:  persistant dizziness or light-headedness   Complete by: As directed    Call MD for:  persistant nausea and vomiting   Complete by: As directed    Call MD for:  redness, tenderness, or signs of infection (pain, swelling, redness, odor or green/yellow discharge around incision site)   Complete by: As directed    Call MD for:  severe uncontrolled pain   Complete by: As  directed    Call MD for:  temperature >100.4   Complete by: As directed    Diet - low sodium heart healthy   Complete by: As directed    Diet Carb Modified   Complete by: As directed    Discharge instructions   Complete by: As directed    You were cared for by a hospitalist during your hospital stay. If you have any questions about your discharge medications or the care you received while you were in the hospital after you are discharged, you can call the unit and ask to speak with the hospitalist on call if the hospitalist that took care of you is not available. Once you are discharged, your primary care physician will handle any further medical issues. Please note that NO REFILLS for any discharge medications will be authorized once you are discharged, as it is imperative that you return to your primary care physician (or establish a relationship with a primary care physician if you do not have one) for your aftercare needs so that they can reassess your need for medications and monitor your lab values.  Follow up with PCP and General Surgery within 1-2 weeks. Take all medications as prescribed. If symptoms change or worsen please return to the ED for evaluation  Discharge wound care:   Complete by: As directed    Per Surgery   Increase activity slowly   Complete by: As directed      Allergies as of 07/14/2020   No Known Allergies     Medication List    TAKE these medications   acetaminophen 500 MG tablet Commonly known as: TYLENOL Take 2 tablets (1,000 mg total) by mouth every 6 (six) hours. What changed:   how much to take  when to take this  reasons to take this   amoxicillin-clavulanate 875-125 MG tablet Commonly known as: Augmentin Take 1 tablet by mouth every 12 (twelve) hours for 7 days.   doxycycline 100 MG EC tablet Commonly known as: DORYX Take 1 tablet (100 mg total) by mouth 2 (two) times daily for 7 days.   feeding supplement (GLUCERNA SHAKE) Liqd Take  237 mLs by mouth 2 (two) times daily between meals.   hydrocortisone-pramoxine 2.5-1 % rectal cream Commonly known as: ANALPRAM-HC Place 1 application rectally every 6 (six) hours as needed for hemorrhoids.   lip balm ointment Apply 1 application topically 2 (two) times daily.   metFORMIN 500 MG tablet Commonly known as: GLUCOPHAGE Take 1 tablet (500 mg total) by mouth 2 (two) times daily with a meal.   methocarbamol 500 MG tablet Commonly known as: ROBAXIN Take 1 tablet (500 mg total) by mouth every 6 (six) hours as needed for muscle spasms.   nicotine 21 mg/24hr patch Commonly known as: NICODERM CQ - dosed in mg/24 hours Place 1 patch (21 mg total) onto the skin daily. Start taking on: July 15, 2020   ondansetron 4 MG tablet Commonly known as: ZOFRAN Take 1 tablet (4 mg total) by mouth every 6 (six) hours as needed for nausea.   oxyCODONE 5 MG immediate release tablet Commonly known as: Oxy IR/ROXICODONE Take 1 tablet (5 mg total) by mouth every 6 (six) hours as needed for moderate pain.   psyllium 95 % Pack Commonly known as: HYDROCIL/METAMUCIL Take 1 packet by mouth 2 (two) times daily.   witch hazel-glycerin pad Commonly known as: TUCKS Apply 1 application topically as needed for irritation or hemorrhoids.            Discharge Care Instructions  (From admission, onward)         Start     Ordered   07/14/20 0000  Discharge wound care:       Comments: Per Surgery   07/14/20 1155          Follow-up Information    Surgery, [facility]. Go on 07/26/2020.   Specialty: General Surgery Why: 3:15 PM. Please arrive 30 min prior to appointment time. Bring photo ID and insurance information. Contact information: [address] [place] [address] [phone]        Cain Saupe, MD. Call.   Specialty: Family Medicine Why: Follow up within 1-2 weeks Contact information: [address] [place] [address] [phone]               No Known Allergies  Consultations:  General Surgery  Discussed the case with infectious diseases Dr. Judyann Munson   Procedures/Studies: CT PELVIS W CONTRAST  Result Date: 07/13/2020 CLINICAL DATA:  Perirectal abscess EXAM: CT PELVIS WITH CONTRAST TECHNIQUE: Multidetector CT imaging of the pelvis was performed using the standard protocol following the bolus administration of intravenous contrast. CONTRAST:  OMNIPAQUE IOHEXOL 300 MG/ML  SOLN COMPARISON:  Sep 17, 2016 FINDINGS: Urinary  Tract: The visualized distal ureters and bladder appear unremarkable. Bowel: No bowel wall thickening, distention or surrounding inflammation identified within the pelvis. Vascular/Lymphatic: No enlarged pelvic lymph nodes identified. No significant vascular findings. Reproductive: The prostate is unremarkable. Other: Within the right posterior perineal soft tissues there is a small multilocular fluid collection measuring 4.1 x 1.1 by 3.2 cm surrounding skin thickening and subcutaneous edema is noted. No subcutaneous emphysema is seen. A right inguinal fat containing hernia is seen. There is scattered right inguinal lymph nodes, the largest measuring 1.7 cm Musculoskeletal: No acute or significant osseous findings. IMPRESSION: Soft tissue abscess within the posterior right perineal soft tissues measuring 4.1 x 1.1 x 3.2 cm. No subcutaneous emphysema is seen. Right inguinal reactive lymphadenopathy Electronically Signed   By: Jonna Clark M.D.   On: 07/13/2020 00:55     Subjective: And examined at bedside and he is doing much better and felt well.  Stable for discharge from a surgical perspective and his antibiotics were changed to p.o. at the recommendations of ID  Discharge Exam: Vitals:   07/13/20 1944 07/14/20 0519  BP: 131/87 133/84  Pulse: 62 67  Resp:  20  Temp:  97.6 F (36.4 C)  SpO2:  93%   Vitals:   07/13/20 1630 07/13/20 1941 07/13/20 1944 07/14/20 0519  BP: 132/81 (!) 143/102  131/87 133/84  Pulse: 83 67 62 67  Resp: 16 20  20   Temp: 98.1 F (36.7 C) (!) 97.5 F (36.4 C)  97.6 F (36.4 C)  TempSrc: Oral Oral  Oral  SpO2:  95%  93%  Weight:    105.5 kg  Height:       General: Pt is alert, awake, not in acute distress Cardiovascular: RRR, S1/S2 +, no rubs, no gallops Respiratory: CTA bilaterally, no wheezing, no rhonchi Abdominal: Soft, NT, distended secondary to body habitus, bowel sounds + Extremities: no edema, no cyanosis  The results of significant diagnostics from this hospitalization (including imaging, microbiology, ancillary and laboratory) are listed below for reference.    Microbiology: Recent Results (from the past 240 hour(s))  Urine culture     Status: None   Collection Time: 07/12/20  3:02 AM   Specimen: In/Out Cath Urine  Result Value Ref Range Status   Specimen Description   Final    IN/OUT CATH URINE Performed at [facility], [address] [place], [place]    Special Requests   Final    NONE Performed at [facility], [address] [place], [place]    Culture   Final    NO GROWTH Performed at [facility], [address] [place], [place]    Report Status 07/14/2020 FINAL  Final  SARS CORONAVIRUS 2 (TAT 6-24 HRS) Nasopharyngeal Nasopharyngeal Swab     Status: None   Collection Time: 07/12/20 10:18 PM   Specimen: Nasopharyngeal Swab  Result Value Ref Range Status   SARS Coronavirus 2 NEGATIVE NEGATIVE Final    Comment: (NOTE) SARS-CoV-2 target nucleic acids are NOT DETECTED.  The SARS-CoV-2 RNA is generally detectable in upper and lower respiratory specimens during the acute phase of infection. Negative results do not preclude SARS-CoV-2 infection, do not rule out co-infections with other pathogens, and should not be used as the sole basis for treatment or other patient management decisions. Negative results must be combined with clinical  observations, patient history, and epidemiological information. The expected result is Negative.  Fact Sheet for  Patients: HairSlick.no  Fact Sheet for Healthcare Providers: quierodirigir.com  This test is not yet approved or cleared by the [place] FDA and  has been authorized for detection and/or diagnosis of SARS-CoV-2 by FDA under an Emergency Use Authorization (EUA). This EUA will remain  in effect (meaning this test can be used) for the duration of the COVID-19 declaration under Se ction 564(b)(1) of the Act, 21 U.S.C. section 360bbb-3(b)(1), unless the authorization is terminated or revoked sooner.  Performed at [facility], [address] [place], [place] [address]   Blood culture (routine single)     Status: None (Preliminary result)   Collection Time: 07/12/20 10:55 PM   Specimen: BLOOD  Result Value Ref Range Status   Specimen Description   Final    BLOOD LEFT ANTECUBITAL Performed at [facility], [address] [place], [place] [address]    Special Requests   Final    BOTTLES DRAWN AEROBIC AND ANAEROBIC Blood Culture results may not be optimal due to an inadequate volume of blood received in culture bottles Performed at [facility], [address], [place], [place] [address]    Culture   Final    NO GROWTH 1 DAY Performed at [facility], [address], [place], [place] [address]    Report Status PENDING  Incomplete  Culture, blood (single)     Status: None (Preliminary result)   Collection Time: 07/13/20  2:15 AM   Specimen: BLOOD RIGHT HAND  Result Value Ref Range Status   Specimen Description   Final    BLOOD RIGHT HAND Performed at [facility], [address], [place], [place] [address]    Special Requests   Final    BOTTLES DRAWN AEROBIC AND ANAEROBIC Blood Culture adequate volume Performed at [facility], [address], [place], [place] [address]    Culture   Final    NO GROWTH 1 DAY Performed at [facility], [address], [place], [place] [address]    Report Status PENDING  Incomplete  MRSA PCR Screening     Status: None   Collection Time: 07/13/20  3:10 AM   Specimen: Nasopharyngeal  Result Value Ref Range Status   MRSA by PCR NEGATIVE NEGATIVE Final    Comment:        The GeneXpert MRSA Assay (FDA approved for NASAL specimens only), is one component of a comprehensive MRSA colonization surveillance program. It is not intended to diagnose MRSA infection nor to guide or monitor treatment for MRSA infections. Performed at [facility], [address], [place], [place] [address]   Aerobic/Anaerobic Culture w Gram Stain (surgical/deep wound)     Status: None (Preliminary result)   Collection Time: 07/13/20  1:15 PM   Specimen: Wound; Abscess  Result Value Ref Range Status   Specimen Description   Final    ABSCESS RIGHT GLUTEAL Performed at [facility], [address], [place], [place] [address]    Special Requests   Final    NONE Performed at [facility], [address], [place], [place] [address]    Gram Stain   Final    FEW WBC PRESENT, PREDOMINANTLY PMN NO ORGANISMS SEEN    Culture   Final    NO GROWTH < 24 HOURS Performed at [facility], [address] [place], [place] [address]    Report Status PENDING  Incomplete  Labs: BNP (last 3 results) No results for input(s): BNP in the last 8760 hours. Basic Metabolic Panel: Recent Labs  Lab 07/12/20 2255 07/13/20 0601 07/14/20 0636  NA 137 138 139  K 3.5 3.7 3.6  CL 105 105 105  CO2 GLUCOSE 101* 104* 112*  BUN 7 <5* 11  CREATININE 0.81 0.72 0.90  CALCIUM 9.2 8.9 9.0  MG  --   --  1.9  PHOS  --   --  4.4   Liver Function Tests: Recent Labs  Lab 07/12/20 2255 07/14/20 0636  AST 15 12*  ALT 14 11  ALKPHOS 89 81   BILITOT 0.8 0.9  PROT 7.9 7.0  ALBUMIN 3.7 3.4*   No results for input(s): LIPASE, AMYLASE in the last 168 hours. No results for input(s): AMMONIA in the last 168 hours. CBC: Recent Labs  Lab 07/12/20 2255 07/13/20 0601 07/14/20 0636  WBC 23.1* 21.4* 15.7*  NEUTROABS 18.1*  --  10.9*  HGB 15.9 14.8 14.9  HCT 47.7 44.3 45.2  MCV 63.5* 63.8* 63.9*  PLT 222 212 217   Cardiac Enzymes: No results for input(s): CKTOTAL, CKMB, CKMBINDEX, TROPONINI in the last 168 hours. BNP: Invalid input(s): POCBNP CBG: Recent Labs  Lab 07/13/20 1545 07/13/20 1618 07/13/20 2118 07/14/20 0732 07/14/20 1130  GLUCAP 113* 145* 100* 113* 188*   D-Dimer No results for input(s): DDIMER in the last 72 hours. Hgb A1c Recent Labs    07/13/20 0601  HGBA1C 6.8*   Lipid Profile No results for input(s): CHOL, HDL, LDLCALC, TRIG, CHOLHDL, LDLDIRECT in the last 72 hours. Thyroid function studies No results for input(s): TSH, T4TOTAL, T3FREE, THYROIDAB in the last 72 hours.  Invalid input(s): FREET3 Anemia work up No results for input(s): VITAMINB12, FOLATE, FERRITIN, TIBC, IRON, RETICCTPCT in the last 72 hours. Urinalysis    Component Value Date/Time   COLORURINE YELLOW 07/12/2020 0302   APPEARANCEUR CLEAR 07/12/2020 0302   LABSPEC 1.043 (H) 07/12/2020 0302   PHURINE 6.0 07/12/2020 0302   GLUCOSEU NEGATIVE 07/12/2020 0302   HGBUR NEGATIVE 07/12/2020 0302   BILIRUBINUR NEGATIVE 07/12/2020 0302   KETONESUR NEGATIVE 07/12/2020 0302   PROTEINUR NEGATIVE 07/12/2020 0302   UROBILINOGEN 2.0 (H) 08/02/2013 0045   NITRITE NEGATIVE 07/12/2020 0302   LEUKOCYTESUR NEGATIVE 07/12/2020 0302   Sepsis Labs Invalid input(s): PROCALCITONIN,  WBC,  LACTICIDVEN Microbiology Recent Results (from the past 240 hour(s))  Urine culture     Status: None   Collection Time: 07/12/20  3:02 AM   Specimen: In/Out Cath Urine  Result Value Ref Range Status   Specimen Description   Final    IN/OUT CATH  URINE Performed at [facility], [address] [place], [place] [address]    Special Requests   Final    NONE Performed at [facility], [address] [place], [place] [address]    Culture   Final    NO GROWTH Performed at [facility], [address], [place], [place] [address]    Report Status 07/14/2020 FINAL  Final  SARS CORONAVIRUS 2 (TAT 6-24 HRS) Nasopharyngeal Nasopharyngeal Swab     Status: None   Collection Time: 07/12/20 10:18 PM   Specimen: Nasopharyngeal Swab  Result Value Ref Range Status   SARS Coronavirus 2 NEGATIVE NEGATIVE Final    Comment: (NOTE) SARS-CoV-2 target nucleic acids are NOT DETECTED.  The SARS-CoV-2 RNA is generally detectable in upper and lower respiratory specimens during the acute  phase of infection. Negative results do not preclude SARS-CoV-2 infection, do not rule out co-infections with other pathogens, and should not be used as the sole basis for treatment or other patient management decisions. Negative results must be combined with clinical observations, patient history, and epidemiological information. The expected result is Negative.  Fact Sheet for Patients: HairSlick.no  Fact Sheet for Healthcare Providers: quierodirigir.com  This test is not yet approved or cleared by the [place] FDA and  has been authorized for detection and/or diagnosis of SARS-CoV-2 by FDA under an Emergency Use Authorization (EUA). This EUA will remain  in effect (meaning this test can be used) for the duration of the COVID-19 declaration under Se ction 564(b)(1) of the Act, 21 U.S.C. section 360bbb-3(b)(1), unless the authorization is terminated or revoked sooner.  Performed at [facility], [address] [place], [place] [address]   Blood culture (routine single)     Status: None (Preliminary result)   Collection Time: 07/12/20 10:55 PM    Specimen: BLOOD  Result Value Ref Range Status   Specimen Description   Final    BLOOD LEFT ANTECUBITAL Performed at [facility], [address], [place], [place] [address]    Special Requests   Final    BOTTLES DRAWN AEROBIC AND ANAEROBIC Blood Culture results may not be optimal due to an inadequate volume of blood received in culture bottles Performed at [facility], [address] [place], [place] [address]    Culture   Final    NO GROWTH 1 DAY Performed at [facility], [address] [place], [place] [address]    Report Status PENDING  Incomplete  Culture, blood (single)     Status: None (Preliminary result)   Collection Time: 07/13/20  2:15 AM   Specimen: BLOOD RIGHT HAND  Result Value Ref Range Status   Specimen Description   Final    BLOOD RIGHT HAND Performed at [facility], [address], [place], [place] [address]    Special Requests   Final    BOTTLES DRAWN AEROBIC AND ANAEROBIC Blood Culture adequate volume Performed at [facility], [address], [place], [place] [address]    Culture   Final    NO GROWTH 1 DAY Performed at [facility], [address], [place], [place] [address]    Report Status PENDING  Incomplete  MRSA PCR Screening     Status: None   Collection Time: 07/13/20  3:10 AM   Specimen: Nasopharyngeal  Result Value Ref Range Status   MRSA by PCR NEGATIVE NEGATIVE Final    Comment:        The GeneXpert MRSA Assay (FDA approved for NASAL specimens only), is one component of a comprehensive MRSA colonization surveillance program. It is not intended to diagnose MRSA infection nor to guide or monitor treatment for MRSA infections. Performed at [facility], [address], [place], [place] [address]   Aerobic/Anaerobic Culture w Gram Stain (surgical/deep wound)     Status: None (Preliminary result)   Collection Time: 07/13/20  1:15  PM   Specimen: Wound; Abscess  Result Value Ref Range Status   Specimen Description   Final    ABSCESS RIGHT GLUTEAL Performed at [facility], [address] [place], [place] [address]    Special Requests   Final    NONE Performed at [facility], [address] [place],  [place] [id number]    Gram Stain   Final    FEW WBC PRESENT, PREDOMINANTLY PMN NO ORGANISMS SEEN    Culture   Final    NO GROWTH < 24 HOURS Performed at [facility], [address] [place], [place] [id number]    Report Status PENDING  Incomplete   Time coordinating discharge: 35 minutes  SIGNED:  Merlene Laughtermair Latif Eylin Pontarelli, DO [facility] 07/14/2020, 7:25 PM Pager is on AMION  If 7PM-7AM, please contact night-coverage [email or web address]

## 2020-07-16 ENCOUNTER — Telehealth: Payer: Self-pay

## 2020-07-16 NOTE — Telephone Encounter (Signed)
Transition Care Management Unsuccessful Follow-up Telephone Call  Date of discharge and from where:  07/14/2020, [facility] [place]   Attempts:  1st Attempt  Reason for unsuccessful TCM follow-up call:  Missing or invalid number - # [phone] not in service.  Need to inquire if patient would like to schedule a follow up appointment at one of the [facility].  Dr Jillyn Hidden is listed as his PCP but she is no longer at [facility]

## 2020-07-17 ENCOUNTER — Telehealth: Payer: Self-pay

## 2020-07-17 NOTE — Telephone Encounter (Signed)
Transition Care Management Unsuccessful Follow-up Telephone Call  Date of discharge and from where:  07/14/2020, [facility]   Attempts:  2nd Attempt  Reason for unsuccessful TCM follow-up call:  Missing or invalid number # [phone], not in service.   Call placed to Martin Crawford ( spouse) # [phone], message left for Martin Crawford with call back requested to this CM.   Need to inquire if patient would like to schedule a follow up appointment at one of the [facility].  Dr Jillyn Hidden is listed as his PCP but she is no longer at [facility]

## 2020-07-18 ENCOUNTER — Telehealth: Payer: Self-pay

## 2020-07-18 LAB — CULTURE, BLOOD (SINGLE)
Culture: NO GROWTH
Culture: NO GROWTH
Special Requests: ADEQUATE

## 2020-07-18 LAB — AEROBIC/ANAEROBIC CULTURE W GRAM STAIN (SURGICAL/DEEP WOUND)

## 2020-07-18 NOTE — Telephone Encounter (Signed)
Transition Care Management Unsuccessful Follow-up Telephone Call  Date of discharge and from where:  07/14/2020, [facility]  Attempts:  3rd Attempt  Reason for unsuccessful TCM follow-up call:  Unable to reach patient -  # [phone], not in service.   Call placed to Tevita Gomer ( spouse) # [phone].  She said that the patient was not home at this time.  She will have him call this CM back.    Need to inquire if patient would like to schedule a follow up appointment at one of the [facility]. Dr Jillyn Hidden is listed as his PCP but she is no longer at [facility]

## 2021-04-03 ENCOUNTER — Encounter (HOSPITAL_COMMUNITY): Payer: Self-pay

## 2021-04-03 ENCOUNTER — Other Ambulatory Visit: Payer: Self-pay

## 2021-04-03 ENCOUNTER — Ambulatory Visit (HOSPITAL_COMMUNITY)
Admission: EM | Admit: 2021-04-03 | Discharge: 2021-04-03 | Disposition: A | Discharge status: Home / Self Care | Payer: Self-pay

## 2021-04-03 DIAGNOSIS — L723 Sebaceous cyst: Secondary | ICD-10-CM

## 2021-04-03 NOTE — ED Triage Notes (Signed)
Pt presents for cyst on neck that he has had for 7 years with no issues.

## 2021-04-03 NOTE — ED Provider Notes (Signed)
[facility]    CSN: [id number] Arrival date & time: 04/03/21  8756      History   Chief Complaint Chief Complaint  Patient presents with   Cyst    HPI Martin Crawford is a 45 y.o. male.   Pt reports he has a cyst on his neck.  Pt reports he has had for 7 years.  Pt reports he donates plasma and was told he had to have area evaluated before he could give plasma.  Pt reports area does not cause him any problems.    The history is provided by the patient. No language interpreter was used.   Past Medical History:  Diagnosis Date   Asthma    Chronic back pain    Diabetes mellitus type II, non insulin dependent (HCC) 07/13/2020   History of frequent abscesses of skin and subcutaneous tissue 07/13/2020   Pilonidal abscess 09/22/2016    Patient Active Problem List   Diagnosis Date Noted   Perineal abscess 07/13/2020   Obesity (BMI 30-39.9) 07/13/2020   Diabetes mellitus type II, non insulin dependent (HCC) 07/13/2020   History of frequent abscesses of skin and subcutaneous tissue 07/13/2020   Spasm of muscle of lower back 02/13/2019   Abscess of scalp 02/08/2019   Abscess and cellulitis of gluteal region 09/17/2016   Sepsis (HCC) 09/17/2016   Tobacco abuse 09/17/2016   Chronic back pain    Asthma     Past Surgical History:  Procedure Laterality Date   INCISION AND DRAINAGE ABSCESS N/A 09/23/2016   Procedure: INCISION AND DRAINAGE ABSCESS PILONIDAL ABCESS;  Surgeon: Glenna Fellows, MD;  Location: [facility];  Service: General;  Laterality: N/A;   INCISION AND DRAINAGE ABSCESS  08/02/2013   buttock abscess - I&D in ED   INCISION AND DRAINAGE ABSCESS N/A 07/13/2020   Procedure: Francia Greaves UNDER ANESTHESIA/EXCISION AND DRAINAGE PERINEAL ABSCESS;  Surgeon: Fritzi Mandes, MD;  Location: [facility];  Service: General;  Laterality: N/A;   PILONIDAL CYST DRAINAGE  09/23/2016   pilonidal abscess       Home Medications    Prior to Admission medications   Medication Sig  Start Date End Date Taking? Authorizing Provider  acetaminophen (TYLENOL) 500 MG tablet Take 2 tablets (1,000 mg total) by mouth every 6 (six) hours. 07/14/20   Marguerita Merles Latif, DO  feeding supplement, GLUCERNA SHAKE, (GLUCERNA SHAKE) LIQD Take 237 mLs by mouth 2 (two) times daily between meals. 07/14/20   Marguerita Merles Latif, DO  hydrocortisone-pramoxine [facility]) 2.5-1 % rectal cream Place 1 application rectally every 6 (six) hours as needed for hemorrhoids. 07/14/20   Marguerita Merles Latif, DO  lip balm (CARMEX) ointment Apply 1 application topically 2 (two) times daily. 07/14/20   Marguerita Merles Latif, DO  metFORMIN (GLUCOPHAGE) 500 MG tablet Take 1 tablet (500 mg total) by mouth 2 (two) times daily with a meal. Patient not taking: Reported on 07/13/2020 03/10/19   Fulp, Hewitt Shorts, MD  methocarbamol (ROBAXIN) 500 MG tablet Take 1 tablet (500 mg total) by mouth every 6 (six) hours as needed for muscle spasms. 07/14/20   Sheikh, Kateri Mc Latif, DO  nicotine (NICODERM CQ - DOSED IN MG/24 HOURS) 21 mg/24hr patch Place 1 patch (21 mg total) onto the skin daily. 07/15/20   Marguerita Merles Latif, DO  ondansetron (ZOFRAN) 4 MG tablet Take 1 tablet (4 mg total) by mouth every 6 (six) hours as needed for nausea. 07/14/20   Marguerita Merles Latif, DO  oxyCODONE (OXY IR/ROXICODONE) 5  MG immediate release tablet Take 1 tablet (5 mg total) by mouth every 6 (six) hours as needed for moderate pain. 07/14/20   Marguerita Merles Latif, DO  psyllium (HYDROCIL/METAMUCIL) 95 % PACK Take 1 packet by mouth 2 (two) times daily. 07/14/20   Merlene Laughter, DO  witch hazel-glycerin (TUCKS) pad Apply 1 application topically as needed for irritation or hemorrhoids. 07/14/20   Merlene Laughter, DO    Family History Family History  Problem Relation Age of Onset   Stroke Mother    Hypertension Mother    Asthma Father    Hypertension Father     Social History Social History   Tobacco Use   Smoking status: Every Day    Packs/day:  1.00    Years: 28.00    Pack years: 28.00    Types: Cigarettes   Smokeless tobacco: Never  Vaping Use   Vaping Use: Former  Substance Use Topics   Alcohol use: No   Drug use: No     Allergies   Patient has no known allergies.   Review of Systems Review of Systems  All other systems reviewed and are negative.   Physical Exam Triage Vital Signs ED Triage Vitals  Enc Vitals Group     BP 04/03/21 1050 (!) 145/102     Pulse Rate 04/03/21 1050 71     Resp 04/03/21 1050 18     Temp 04/03/21 1050 98.7 F (37.1 C)     Temp Source 04/03/21 1050 Oral     SpO2 04/03/21 1050 94 %     Weight --      Height --      Head Circumference --      Peak Flow --      Pain Score 04/03/21 1052 0     Pain Loc --      Pain Edu? --      Excl. in GC? --    No data found.  Updated Vital Signs BP (!) 145/102 (BP Location: Left Arm)    Pulse 71    Temp 98.7 F (37.1 C) (Oral)    Resp 18    SpO2 94%   Visual Acuity Right Eye Distance:   Left Eye Distance:   Bilateral Distance:    Right Eye Near:   Left Eye Near:    Bilateral Near:     Physical Exam Vitals reviewed.  Constitutional:      Appearance: Normal appearance.  Musculoskeletal:        General: Normal range of motion.     Comments: 1cm cyst right neck,   92mm cyst chest,  no erythema   Skin:    General: Skin is warm.  Neurological:     General: No focal deficit present.     Mental Status: He is alert.  Psychiatric:        Mood and Affect: Mood normal.     UC Treatments / Results  Labs (all labs ordered are listed, but only abnormal results are displayed) Labs Reviewed - No data to display  EKG   Radiology No results found.  Procedures Procedures (including critical care time)  Medications Ordered in UC Medications - No data to display  Initial Impression / Assessment and Plan / UC Course  I have reviewed the triage vital signs and the nursing notes.  Pertinent labs & imaging results that were  available during my care of the patient were reviewed by me and considered in my medical decision making (  see chart for details).      Final Clinical Impressions(s) / UC Diagnoses   Final diagnoses:  Sebaceous cyst   Discharge Instructions   None    ED Prescriptions   None    PDMP not reviewed this encounter. An After Visit Summary was printed and given to the patient.    Elson Areas, [place] 04/03/21 1303

## 2021-07-16 ENCOUNTER — Emergency Department (HOSPITAL_COMMUNITY)
Admission: EM | Admit: 2021-07-16 | Discharge: 2021-07-16 | Disposition: A | Discharge status: Home / Self Care | Payer: Self-pay | Attending: Emergency Medicine | Admitting: Emergency Medicine

## 2021-07-16 ENCOUNTER — Encounter (HOSPITAL_COMMUNITY): Payer: Self-pay | Admitting: Pharmacy Technician

## 2021-07-16 ENCOUNTER — Other Ambulatory Visit: Payer: Self-pay

## 2021-07-16 DIAGNOSIS — K625 Hemorrhage of anus and rectum: Secondary | ICD-10-CM | POA: Insufficient documentation

## 2021-07-16 DIAGNOSIS — J45909 Unspecified asthma, uncomplicated: Secondary | ICD-10-CM | POA: Insufficient documentation

## 2021-07-16 DIAGNOSIS — M62838 Other muscle spasm: Secondary | ICD-10-CM | POA: Insufficient documentation

## 2021-07-16 DIAGNOSIS — G8929 Other chronic pain: Secondary | ICD-10-CM | POA: Insufficient documentation

## 2021-07-16 DIAGNOSIS — M79651 Pain in right thigh: Secondary | ICD-10-CM | POA: Insufficient documentation

## 2021-07-16 DIAGNOSIS — Z7984 Long term (current) use of oral hypoglycemic drugs: Secondary | ICD-10-CM | POA: Insufficient documentation

## 2021-07-16 DIAGNOSIS — E119 Type 2 diabetes mellitus without complications: Secondary | ICD-10-CM | POA: Insufficient documentation

## 2021-07-16 DIAGNOSIS — M545 Low back pain, unspecified: Secondary | ICD-10-CM | POA: Insufficient documentation

## 2021-07-16 LAB — URINALYSIS, ROUTINE W REFLEX MICROSCOPIC
Bilirubin Urine: NEGATIVE
Glucose, UA: NEGATIVE mg/dL
Ketones, ur: NEGATIVE mg/dL
Leukocytes,Ua: NEGATIVE
Nitrite: NEGATIVE
Protein, ur: NEGATIVE mg/dL
Specific Gravity, Urine: 1.017 (ref 1.005–1.030)
pH: 5 (ref 5.0–8.0)

## 2021-07-16 LAB — BASIC METABOLIC PANEL
Anion gap: 9 (ref 5–15)
BUN: 7 mg/dL (ref 6–20)
CO2: 23 mmol/L (ref 22–32)
Calcium: 9.3 mg/dL (ref 8.9–10.3)
Chloride: 107 mmol/L (ref 98–111)
Creatinine, Ser: 0.84 mg/dL (ref 0.61–1.24)
GFR, Estimated: >60 mL/min (ref >60)
Glucose, Bld: 114 mg/dL — ABNORMAL HIGH (ref 70–99)
Potassium: 4 mmol/L (ref 3.5–5.1)
Sodium: 139 mmol/L (ref 135–145)

## 2021-07-16 LAB — CBC WITH DIFFERENTIAL/PLATELET
Abs Immature Granulocytes: 0.04 10*3/uL (ref 0.00–0.07)
Basophils Absolute: 0.1 10*3/uL (ref 0.0–0.1)
Basophils Relative: 1 %
Eosinophils Absolute: 0.2 10*3/uL (ref 0.0–0.5)
Eosinophils Relative: 2 %
HCT: 47.9 % (ref 39.0–52.0)
Hemoglobin: 15.8 g/dL (ref 13.0–17.0)
Immature Granulocytes: 0 %
Lymphocytes Relative: 29 %
Lymphs Abs: 3.3 10*3/uL (ref 0.7–4.0)
MCH: 21 pg — ABNORMAL LOW (ref 26.0–34.0)
MCHC: 33 g/dL (ref 30.0–36.0)
MCV: 63.8 fL — ABNORMAL LOW (ref 80.0–100.0)
Monocytes Absolute: 0.8 10*3/uL (ref 0.1–1.0)
Monocytes Relative: 7 %
Neutro Abs: 7 10*3/uL (ref 1.7–7.7)
Neutrophils Relative %: 61 %
Platelets: 202 10*3/uL (ref 150–400)
RBC: 7.51 MIL/uL — ABNORMAL HIGH (ref 4.22–5.81)
RDW: 19.1 % — ABNORMAL HIGH (ref 11.5–15.5)
WBC: 11.3 10*3/uL — ABNORMAL HIGH (ref 4.0–10.5)
nRBC: 0 % (ref 0.0–0.2)

## 2021-07-16 MED ORDER — LIDOCAINE 5 % EX PTCH: 1.0000 | MEDICATED_PATCH | CUTANEOUS | 0 refills | Status: DC

## 2021-07-16 MED ORDER — CYCLOBENZAPRINE HCL 10 MG PO TABS: 10.0000 mg | ORAL_TABLET | Freq: Two times a day (BID) | ORAL | 0 refills | Status: DC | PRN

## 2021-07-16 NOTE — ED Notes (Signed)
EDP at [facility] 

## 2021-07-16 NOTE — ED Provider Notes (Signed)
?[facility] EMERGENCY DEPARTMENT ?Provider Note ? ? ?CSN: [id number] ?Arrival date & time: 07/16/21  1559 ? ?  ? ?History ? ?Chief Complaint  ?Patient presents with  ? Shoulder Pain  ? Back Pain  ? ? ?Martin Crawford is a 46 y.o. male. ? ?The history is provided by the patient and medical records. No language interpreter was used.  ?Shoulder Pain ?Location:  Shoulder ?Shoulder location:  R shoulder ?Injury: no   ?Pain details:  ?  Quality:  Cramping and aching ?  Radiates to:  Does not radiate ?  Severity:  Moderate ?  Onset quality:  Gradual ?  Duration:  2 weeks ?  Timing:  Constant ?  Progression:  Waxing and waning ?Handedness:  Right-handed ?Dislocation: no   ?Foreign body present:  No foreign bodies ?Tetanus status:  Unknown ?Prior injury to area:  No ?Relieved by:  Being still ?Worsened by:  Bearing weight ?Ineffective treatments:  None tried ?Associated symptoms: back pain   ?Associated symptoms: no decreased range of motion, no fatigue, no fever, no muscle weakness, no neck pain, no numbness and no stiffness   ?Risk factors: no frequent fractures and no recent illness   ?Back Pain ?Associated symptoms: no abdominal pain, no chest pain, no dysuria, no fever, no headaches, no numbness and no weakness   ? ?  ? ?Home Medications ?Prior to Admission medications   ?Medication Sig Start Date End Date Taking? Authorizing Provider  ?acetaminophen (TYLENOL) 500 MG tablet Take 2 tablets (1,000 mg total) by mouth every 6 (six) hours. 07/14/20   Marguerita Merles Latif, DO  ?feeding supplement, GLUCERNA SHAKE, (GLUCERNA SHAKE) LIQD Take 237 mLs by mouth 2 (two) times daily between meals. 07/14/20   Marguerita Merles Latif, DO  ?hydrocortisone-pramoxine [facility]) 2.5-1 % rectal cream Place 1 application rectally every 6 (six) hours as needed for hemorrhoids. 07/14/20   Marguerita Merles Latif, DO  ?lip balm (CARMEX) ointment Apply 1 application topically 2 (two) times daily. 07/14/20   Marguerita Merles Latif, DO   ?metFORMIN (GLUCOPHAGE) 500 MG tablet Take 1 tablet (500 mg total) by mouth 2 (two) times daily with a meal. ?Patient not taking: Reported on 07/13/2020 03/10/19   Fulp, Hewitt Shorts, MD  ?methocarbamol (ROBAXIN) 500 MG tablet Take 1 tablet (500 mg total) by mouth every 6 (six) hours as needed for muscle spasms. 07/14/20   Marguerita Merles Latif, DO  ?nicotine (NICODERM CQ - DOSED IN MG/24 HOURS) 21 mg/24hr patch Place 1 patch (21 mg total) onto the skin daily. 07/15/20   Marguerita Merles Latif, DO  ?ondansetron (ZOFRAN) 4 MG tablet Take 1 tablet (4 mg total) by mouth every 6 (six) hours as needed for nausea. 07/14/20   Marguerita Merles Latif, DO  ?oxyCODONE (OXY IR/ROXICODONE) 5 MG immediate release tablet Take 1 tablet (5 mg total) by mouth every 6 (six) hours as needed for moderate pain. 07/14/20   Marguerita Merles Latif, DO  ?psyllium (HYDROCIL/METAMUCIL) 95 % PACK Take 1 packet by mouth 2 (two) times daily. 07/14/20   Merlene Laughter, DO  ?witch hazel-glycerin (TUCKS) pad Apply 1 application topically as needed for irritation or hemorrhoids. 07/14/20   Merlene Laughter, DO  ?   ? ?Allergies    ?Patient has no known allergies.   ? ?Review of Systems   ?Review of Systems  ?Constitutional:  Negative for chills, diaphoresis, fatigue and fever.  ?Eyes:  Negative for visual disturbance.  ?Respiratory:  Negative for cough, chest tightness, shortness of  breath and wheezing.   ?Cardiovascular:  Negative for chest pain, palpitations and leg swelling.  ?Gastrointestinal:  Negative for abdominal pain, constipation, diarrhea, nausea and vomiting.  ?Genitourinary:  Negative for dysuria, flank pain and hematuria.  ?Musculoskeletal:  Positive for back pain. Negative for neck pain, neck stiffness and stiffness.  ?Skin:  Negative for wound.  ?Neurological:  Negative for dizziness, weakness, light-headedness, numbness and headaches.  ?Psychiatric/Behavioral:  Negative for agitation.   ?All other systems reviewed and are negative. ? ?Physical  Exam ?Updated Vital Signs ?BP (!) 150/100 (BP Location: Left Arm)   Pulse 93   Temp 98.6 ?F (37 ?C) (Oral)   Resp 18   SpO2 96%  ?Physical Exam ?Vitals and nursing note reviewed.  ?Constitutional:   ?   General: He is not in acute distress. ?   Appearance: He is well-developed. He is not ill-appearing, toxic-appearing or diaphoretic.  ?HENT:  ?   Head: Normocephalic and atraumatic.  ?   Mouth/Throat:  ?   Mouth: Mucous membranes are moist.  ?Eyes:  ?   Conjunctiva/sclera: Conjunctivae normal.  ?Cardiovascular:  ?   Rate and Rhythm: Normal rate and regular rhythm.  ?   Heart sounds: No murmur heard. ?Pulmonary:  ?   Effort: Pulmonary effort is normal. No respiratory distress.  ?   Breath sounds: Normal breath sounds. No wheezing, rhonchi or rales.  ?Chest:  ?   Chest wall: No tenderness.  ?Abdominal:  ?   Palpations: Abdomen is soft.  ?   Tenderness: There is no abdominal tenderness. There is no right CVA tenderness, left CVA tenderness, guarding or rebound.  ?Musculoskeletal:     ?   General: Tenderness present. No swelling or signs of injury.  ?   Cervical back: Neck supple. No tenderness.  ?   Lumbar back: Spasms and tenderness present. No bony tenderness.  ?     Back: ? ?   Right upper leg: Tenderness present. No swelling, edema, deformity, lacerations or bony tenderness.  ?   Right lower leg: No edema.  ?   Left lower leg: No edema.  ?     Legs: ? ?   Comments: Muscle spasms and tenderness on right upper shoulder area and right low back and right thigh. ? ?Intact sensation, strength, pulses both in upper and lower extremities.  Normal range of motion of extremities.  Was able to touch right hand to left shoulder.  ?Skin: ?   General: Skin is warm and dry.  ?   Capillary Refill: Capillary refill takes less than 2 seconds.  ?   Findings: No erythema or rash.  ?Neurological:  ?   General: No focal deficit present.  ?   Mental Status: He is alert.  ?   Sensory: No sensory deficit.  ?   Motor: No weakness.   ?Psychiatric:     ?   Mood and Affect: Mood normal.  ? ? ?ED Results / Procedures / Treatments   ?Labs ?(all labs ordered are listed, but only abnormal results are displayed) ?Labs Reviewed  ?BASIC METABOLIC PANEL - Abnormal; Notable for the following components:  ?    Result Value  ? Glucose, Bld 114 (*)   ? All other components within normal limits  ?CBC WITH DIFFERENTIAL/PLATELET - Abnormal; Notable for the following components:  ? WBC 11.3 (*)   ? RBC 7.51 (*)   ? MCV 63.8 (*)   ? MCH 21.0 (*)   ? RDW 19.1 (*)   ?  All other components within normal limits  ?URINALYSIS, ROUTINE W REFLEX MICROSCOPIC - Abnormal; Notable for the following components:  ? Hgb urine dipstick SMALL (*)   ? Bacteria, UA RARE (*)   ? All other components within normal limits  ? ? ?EKG ?None ? ?Radiology ?No results found. ? ?Procedures ?Procedures  ? ? ?Medications Ordered in ED ?Medications - No data to display ? ?ED Course/ Medical Decision Making/ A&P ?  ?                        ?Medical Decision Making ? ? ?BRAXLEY BALANDRAN is a 46 y.o. male with a past medical history significant for frequent abscesses, diabetes, asthma, chronic back pain, and muscle spasms present with pains and chronic rectal bleeding.  According to patient, for months he has been having rectal bleeding that comes and goes but denies any significant abdominal pain.  Denies any vomiting or chest pain.  Denies any fatigue, lightheadedness, or syncope.  Denies chest pain or shortness of breath.  He reports that for the last week or 2 he has had some spasms and pain in his right upper back going to his shoulder that is worse with movement.  He says that he lifts heavy things at work and although there was nonspecific episode lifting at his work has exacerbated his symptoms.  He denies numbness, tingling, or weakness in extremities.  He reports he is also having pain in his right low back with pain and spasm as well as his right thigh.  Both of those pains have been  more chronic in nature but have worsened.  He denies any leg pain or leg swelling more distally and denies any testicle or GU symptoms.  Denies any history of hemorrhoids or rectal pain and reports she has had some blood

## 2021-07-16 NOTE — Discharge Instructions (Addendum)
Your history, exam, work-up today are consistent with both painful muscle spasms in the shoulder, low back, and thigh as well as chronic rectal bleeding.  Your pain symptoms are correlating well with your exam with the muscle spasms and tenderness.  Given the lack of acute traumatic injury, we agreed together with shared decision-making conversation hold on imaging today however we do want to pivot to a different muscle relaxant and give you Lidoderm patches.  Please follow-up with orthopedics for further evaluation and management.  For the chronic rectal bleeding you have had, your hemoglobin was normal today but we do feel its important you to follow-up with outpatient GI team for further evaluation as you report you have never had colonoscopy before.  If any symptoms are to change or worsen acutely, please return to the nearest emergency department. ?

## 2021-07-16 NOTE — ED Provider Triage Note (Signed)
Emergency Medicine Provider Triage Evaluation Note ? ?Martin Crawford , a 46 y.o. male  was evaluated in triage.  Here for multiple complaints. ? ?Right shoulder pain and Right upper leg with associated right lower back pain. Ongoing for three days. Hurts worse when sitting down and bending over. Throbbing pain. Worse with movement. Denies trauma. No other joints hurt. Ambulating okay. Never had lower back pain before.  He works for [employer] and has to lift a lot of heavy items so he thinks that he may have lifted something heavy to cause his symptoms. No saddle anesthesia, numbness, gait disturbance, fever, chills, bowel or bladder dysfunction.  ?Right shoulder also hurts. He has difficulty with full ROM. Again, no notable trauma other than heavy lifting often.  ? ?Also complains of blood in stool. Started this morning. Dark red. Present in his stool and when he wipes. This has been going on for three months off and on. It was worse this morning. No pain with BM, no constipation, no diarrhea, abdominal, nausea, or vomiting, or fevers.  ? ?Review of Systems  ?Positive: Shoulder pain, lower back pain, right leg pain, blood in stool ?Negative:  ? ?Physical Exam  ?BP (!) 150/100 (BP Location: Left Arm)   Pulse 93   Temp 98.6 ?F (37 ?C) (Oral)   Resp 18   SpO2 96%  ?Gen:   Awake, no distress   ?Resp:  Normal effort  ?MSK:   Moves extremities without difficulty  ?Other:  Reproducible right lower back pain. No midline lumbar tenderness.  ?Reproducible right shoulder pain. Abdomen is soft, nontender.  ? ?Medical Decision Making  ?Medically screening exam initiated at 4:22 PM.  Appropriate orders placed.  Martin Crawford was informed that the remainder of the evaluation will be completed by another provider, this initial triage assessment does not replace that evaluation, and the importance of remaining in the ED until their evaluation is complete. ? ? ?  ?Claudie Leach, PA-C ?07/16/21 1632 ? ?

## 2021-07-16 NOTE — ED Triage Notes (Signed)
Pt with lower back pain. R shoulder pain and noticed some blood in his stool.  ?

## 2021-11-16 ENCOUNTER — Other Ambulatory Visit: Payer: Self-pay

## 2021-11-16 ENCOUNTER — Emergency Department (HOSPITAL_COMMUNITY)
Admission: EM | Admit: 2021-11-16 | Discharge: 2021-11-16 | Discharge status: Against Medical Advice | Payer: No Typology Code available for payment source | Attending: Emergency Medicine | Admitting: Emergency Medicine

## 2021-11-16 ENCOUNTER — Encounter (HOSPITAL_COMMUNITY): Payer: Self-pay

## 2021-11-16 DIAGNOSIS — Z5321 Procedure and treatment not carried out due to patient leaving prior to being seen by health care provider: Secondary | ICD-10-CM | POA: Diagnosis not present

## 2021-11-16 DIAGNOSIS — R1032 Left lower quadrant pain: Secondary | ICD-10-CM | POA: Insufficient documentation
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
## 2021-11-16 NOTE — ED Notes (Signed)
Returned identification stickers and left from waiting area.

## 2021-11-17 ENCOUNTER — Encounter (HOSPITAL_COMMUNITY): Payer: Self-pay | Admitting: Emergency Medicine

## 2021-11-17 ENCOUNTER — Other Ambulatory Visit: Payer: Self-pay

## 2021-11-17 ENCOUNTER — Ambulatory Visit (HOSPITAL_COMMUNITY)
Admission: EM | Admit: 2021-11-17 | Discharge: 2021-11-17 | Disposition: A | Discharge status: Home / Self Care | Payer: No Typology Code available for payment source | Attending: Physician Assistant | Admitting: Physician Assistant

## 2021-11-17 DIAGNOSIS — L0501 Pilonidal cyst with abscess: Secondary | ICD-10-CM | POA: Diagnosis not present

## 2021-11-17 MED ORDER — HYDROCODONE-ACETAMINOPHEN 5-325 MG PO TABS: 1.0000 | ORAL_TABLET | Freq: Two times a day (BID) | ORAL | 0 refills | Status: DC | PRN

## 2021-11-17 MED ORDER — SULFAMETHOXAZOLE-TRIMETHOPRIM 800-160 MG PO TABS: 1.0000 | ORAL_TABLET | Freq: Two times a day (BID) | ORAL | 0 refills | Status: AC

## 2021-11-17 MED ORDER — LIDOCAINE-EPINEPHRINE 1 %-1:100000 IJ SOLN
INTRAMUSCULAR | Status: AC
  Filled 2021-11-17: qty 1

## 2021-11-17 MED ORDER — MUPIROCIN 2 % EX OINT: 1.0000 | TOPICAL_OINTMENT | Freq: Two times a day (BID) | CUTANEOUS | 0 refills | Status: DC

## 2021-11-17 NOTE — ED Triage Notes (Signed)
History of abscess.  Patient reportedly has an abscess to top of gluteal fold.

## 2021-11-17 NOTE — ED Provider Notes (Signed)
[facility]    CSN: [id number] Arrival date & time: 11/17/21  1356      History   Chief Complaint Chief Complaint  Patient presents with   Abscess    HPI Martin Crawford is a 46 y.o. male.   Patient in today with a 3-day history of enlarging pilonidal abscess.  Reports pain is rated 10 on a 0-10 pain scale, described as throbbing, no aggravating relieving factors identified.  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  He reports last episode was approximately 3 years ago.  He denies any recent antibiotics.  He does have diabetes but denies additional immunosuppression.  He has been using over-the-counter medication including Tylenol and ibuprofen without improvement of symptoms.  He reports pain is severe and is requesting pain medication if appropriate after drainage today.    Past Medical History:  Diagnosis Date   Asthma    Chronic back pain    Diabetes mellitus type II, non insulin dependent (HCC) 07/13/2020   History of frequent abscesses of skin and subcutaneous tissue 07/13/2020   Pilonidal abscess 09/22/2016    Patient Active Problem List   Diagnosis Date Noted   Perineal abscess 07/13/2020   Obesity (BMI 30-39.9) 07/13/2020   Diabetes mellitus type II, non insulin dependent (HCC) 07/13/2020   History of frequent abscesses of skin and subcutaneous tissue 07/13/2020   Spasm of muscle of lower back 02/13/2019   Abscess of scalp 02/08/2019   Abscess and cellulitis of gluteal region 09/17/2016   Sepsis (HCC) 09/17/2016   Tobacco abuse 09/17/2016   Chronic back pain    Asthma     Past Surgical History:  Procedure Laterality Date   INCISION AND DRAINAGE ABSCESS N/A 09/23/2016   Procedure: INCISION AND DRAINAGE ABSCESS PILONIDAL ABCESS;  Surgeon: Glenna Fellows, MD;  Location: [facility];  Service: General;  Laterality: N/A;   INCISION AND DRAINAGE ABSCESS  08/02/2013   buttock abscess - I&D in ED   INCISION AND  DRAINAGE ABSCESS N/A 07/13/2020   Procedure: Francia Greaves UNDER ANESTHESIA/EXCISION AND DRAINAGE PERINEAL ABSCESS;  Surgeon: Fritzi Mandes, MD;  Location: [facility];  Service: General;  Laterality: N/A;   PILONIDAL CYST DRAINAGE  09/23/2016   pilonidal abscess       Home Medications    Prior to Admission medications   Medication Sig Start Date End Date Taking? Authorizing Provider  HYDROcodone-acetaminophen (NORCO/VICODIN) 5-325 MG tablet Take 1 tablet by mouth 2 (two) times daily as needed. 11/17/21  Yes Song Garris, Denny Peon K, PA-C  mupirocin ointment (BACTROBAN) 2 % Apply 1 Application topically 2 (two) times daily. 11/17/21  Yes Keahi Mccarney K, PA-C  sulfamethoxazole-trimethoprim (BACTRIM DS) 800-160 MG tablet Take 1 tablet by mouth 2 (two) times daily for 7 days. 11/17/21 11/24/21 Yes Gwendolin Briel, Noberto Retort, PA-C  acetaminophen (TYLENOL) 500 MG tablet Take 2 tablets (1,000 mg total) by mouth every 6 (six) hours. 07/14/20   Marguerita Merles Latif, DO  cyclobenzaprine (FLEXERIL) 10 MG tablet Take 1 tablet (10 mg total) by mouth 2 (two) times daily as needed for muscle spasms. 07/16/21   Tegeler, Canary Brim, MD  feeding supplement, GLUCERNA SHAKE, (GLUCERNA SHAKE) LIQD Take 237 mLs by mouth 2 (two) times daily between meals. 07/14/20   Marguerita Merles Latif, DO  hydrocortisone-pramoxine [facility]) 2.5-1 % rectal cream Place 1 application rectally every 6 (six) hours as needed for hemorrhoids. 07/14/20   Marguerita Merles Latif, DO  lidocaine (LIDODERM) 5 %  Place 1 patch onto the skin daily. Remove & Discard patch within 12 hours or as directed by MD 07/16/21   Tegeler, Canary Brim, MD  lip balm (CARMEX) ointment Apply 1 application topically 2 (two) times daily. 07/14/20   Marguerita Merles Latif, DO  metFORMIN (GLUCOPHAGE) 500 MG tablet Take 1 tablet (500 mg total) by mouth 2 (two) times daily with a meal. Patient not taking: Reported on 07/13/2020 03/10/19   Fulp, Hewitt Shorts, MD  methocarbamol (ROBAXIN) 500 MG tablet Take 1 tablet  (500 mg total) by mouth every 6 (six) hours as needed for muscle spasms. 07/14/20   Sheikh, Kateri Mc Latif, DO  nicotine (NICODERM CQ - DOSED IN MG/24 HOURS) 21 mg/24hr patch Place 1 patch (21 mg total) onto the skin daily. 07/15/20   Marguerita Merles Latif, DO  ondansetron (ZOFRAN) 4 MG tablet Take 1 tablet (4 mg total) by mouth every 6 (six) hours as needed for nausea. 07/14/20   Marguerita Merles Latif, DO  psyllium (HYDROCIL/METAMUCIL) 95 % PACK Take 1 packet by mouth 2 (two) times daily. 07/14/20   Merlene Laughter, DO  witch hazel-glycerin (TUCKS) pad Apply 1 application topically as needed for irritation or hemorrhoids. 07/14/20   Merlene Laughter, DO    Family History Family History  Problem Relation Age of Onset   Stroke Mother    Hypertension Mother    Asthma Father    Hypertension Father     Social History Social History   Tobacco Use   Smoking status: Every Day    Packs/day: 1.00    Years: 28.00    Total pack years: 28.00    Types: Cigarettes   Smokeless tobacco: Never  Vaping Use   Vaping Use: Former  Substance Use Topics   Alcohol use: No   Drug use: No     Allergies   Patient has no known allergies.   Review of Systems Review of Systems  Constitutional:  Positive for activity change. Negative for appetite change, fatigue and fever.  Respiratory:  Negative for cough and shortness of breath.   Cardiovascular:  Negative for chest pain.  Gastrointestinal:  Negative for abdominal pain, diarrhea, nausea and vomiting.  Musculoskeletal:  Negative for arthralgias and myalgias.  Skin:  Positive for wound.     Physical Exam Triage Vital Signs ED Triage Vitals  Enc Vitals Group     BP 11/17/21 1526 (!) 137/98     Pulse Rate 11/17/21 1526 88     Resp 11/17/21 1526 20     Temp 11/17/21 1526 98.1 F (36.7 C)     Temp Source 11/17/21 1526 Oral     SpO2 11/17/21 1526 97 %     Weight --      Height --      Head Circumference --      Peak Flow --      Pain Score  11/17/21 1524 10     Pain Loc --      Pain Edu? --      Excl. in GC? --    No data found.  Updated Vital Signs BP (!) 137/98 (BP Location: Right Arm) Comment (BP Location): large cuff  Pulse 88   Temp 98.1 F (36.7 C) (Oral)   Resp 20   SpO2 97%   Visual Acuity Right Eye Distance:   Left Eye Distance:   Bilateral Distance:    Right Eye Near:   Left Eye Near:    Bilateral Near:  Physical Exam Vitals reviewed.  Constitutional:      General: He is awake.     Appearance: Normal appearance. He is well-developed. He is not ill-appearing.     Comments: Very pleasant male appears stated age in no acute distress sitting comfortably in exam room  HENT:     Head: Normocephalic and atraumatic.     Mouth/Throat:     Pharynx: No oropharyngeal exudate, posterior oropharyngeal erythema or uvula swelling.  Cardiovascular:     Rate and Rhythm: Normal rate and regular rhythm.     Heart sounds: Normal heart sounds, S1 normal and S2 normal. No murmur heard. Pulmonary:     Effort: Pulmonary effort is normal.     Breath sounds: Normal breath sounds. No stridor. No wheezing, rhonchi or rales.     Comments: Clear to auscultation bilaterally Skin:    Findings: Abscess present.          Comments: 3 cm x 1 cm nodule with central fluctuance noted superior gluteal cleft without active bleeding or drainage.  Neurological:     Mental Status: He is alert.  Psychiatric:        Behavior: Behavior is cooperative.      UC Treatments / Results  Labs (all labs ordered are listed, but only abnormal results are displayed) Labs Reviewed - No data to display  EKG   Radiology No results found.  Procedures Incision and Drainage  Date/Time: 11/17/2021 3:57 PM  Performed by: Jeani Hawking, PA-C Authorized by: Jeani Hawking, PA-C   Consent:    Consent obtained:  Verbal   Consent given by:  Patient   Risks discussed:  Bleeding, damage to other organs and incomplete drainage    Alternatives discussed:  Alternative treatment, observation and referral Universal protocol:    Procedure explained and questions answered to patient or proxy's satisfaction: yes     Patient identity confirmed:  Verbally with patient Location:    Type:  Pilonidal cyst   Size:  3cm by 1 cm   Location:  Anogenital   Anogenital location:  Pilonidal Pre-procedure details:    Skin preparation:  Chlorhexidine with alcohol Sedation:    Sedation type:  None Anesthesia:    Anesthesia method:  Local infiltration   Local anesthetic:  Lidocaine 1% WITH epi Procedure type:    Complexity:  Complex Procedure details:    Ultrasound guidance: no     Needle aspiration: no     Incision types:  Single straight   Incision depth:  Dermal   Wound management:  Probed and deloculated and irrigated with saline   Drainage:  Bloody and purulent   Drainage amount:  Moderate   Wound treatment:  Wound left open   Packing materials:  None Post-procedure details:    Procedure completion:  Tolerated  (including critical care time)  Medications Ordered in UC Medications - No data to display  Initial Impression / Assessment and Plan / UC Course  I have reviewed the triage vital signs and the nursing notes.  Pertinent labs & imaging results that were available during my care of the patient were reviewed by me and considered in my medical decision making (see chart for details).     Abscess was drained in clinic today.  See procedure note above.  Patient was started on Bactrim DS with instruction to stop the medication to be seen immediately with any oral lesions or rash.  He was encouraged to keep area clean and apply Bactroban twice daily.  He can use over-the-counter medications for symptom relief.  Given severity of pain he was provided 4 doses of hydrocodone.  Review of [place] controlled substance database shows no inappropriate refills.  Discussed this can be sedating and he should not drive or  drink alcohol with taking it.  Discussed that if he has any recurrent or worsening symptoms needs to be seen immediately.  If he develops any fever, swelling, pain, nausea, vomiting he needs to be seen immediately.  Should return precautions given.  Final Clinical Impressions(s) / UC Diagnoses   Final diagnoses:  Pilonidal abscess     Discharge Instructions      We drained your abscess.  Keep this area clean with soap and water and apply Bactroban ointment with dressing changes.  Start Bactrim DS twice daily.  If develop any rash or oral lesions stop the medication to be seen immediately.  I have called in a few doses of hydrocodone.  This can make you sleepy do not drive drink alcohol with taking it.  If anything worsens and you have recurrent pain, swelling, fever, nausea, vomiting you need to be seen immediately.     ED Prescriptions     Medication Sig Dispense Auth. Provider   sulfamethoxazole-trimethoprim (BACTRIM DS) 800-160 MG tablet Take 1 tablet by mouth 2 (two) times daily for 7 days. 14 tablet Leandre Wien K, PA-C   mupirocin ointment (BACTROBAN) 2 % Apply 1 Application topically 2 (two) times daily. 22 g Cassell Voorhies K, PA-C   HYDROcodone-acetaminophen (NORCO/VICODIN) 5-325 MG tablet Take 1 tablet by mouth 2 (two) times daily as needed. 4 tablet Belmont Valli K, PA-C      I have reviewed the PDMP during this encounter.   Jeani Hawking, PA-C 11/17/21 1558

## 2021-11-17 NOTE — Discharge Instructions (Addendum)
We drained your abscess.  Keep this area clean with soap and water and apply Bactroban ointment with dressing changes.  Start Bactrim DS twice daily.  If develop any rash or oral lesions stop the medication to be seen immediately.  I have called in a few doses of hydrocodone.  This can make you sleepy do not drive drink alcohol with taking it.  If anything worsens and you have recurrent pain, swelling, fever, nausea, vomiting you need to be seen immediately.

## 2022-02-02 ENCOUNTER — Encounter (HOSPITAL_COMMUNITY): Payer: Self-pay | Admitting: Emergency Medicine

## 2022-02-02 ENCOUNTER — Emergency Department (HOSPITAL_COMMUNITY)
Admission: EM | Admit: 2022-02-02 | Discharge: 2022-02-02 | Discharge status: Against Medical Advice | Payer: No Typology Code available for payment source | Attending: Student | Admitting: Student

## 2022-02-02 ENCOUNTER — Other Ambulatory Visit: Payer: Self-pay

## 2022-02-02 DIAGNOSIS — E119 Type 2 diabetes mellitus without complications: Secondary | ICD-10-CM | POA: Diagnosis not present

## 2022-02-02 DIAGNOSIS — R509 Fever, unspecified: Secondary | ICD-10-CM | POA: Insufficient documentation

## 2022-02-02 DIAGNOSIS — Z5321 Procedure and treatment not carried out due to patient leaving prior to being seen by health care provider: Secondary | ICD-10-CM | POA: Insufficient documentation

## 2022-02-02 DIAGNOSIS — M79652 Pain in left thigh: Secondary | ICD-10-CM | POA: Diagnosis not present

## 2022-02-02 LAB — CBC WITH DIFFERENTIAL/PLATELET
Abs Immature Granulocytes: 0.22 10*3/uL — ABNORMAL HIGH (ref 0.00–0.07)
Basophils Absolute: 0.1 10*3/uL (ref 0.0–0.1)
Basophils Relative: 0 %
Eosinophils Absolute: 0.1 10*3/uL (ref 0.0–0.5)
Eosinophils Relative: 0 %
HCT: 48.1 % (ref 39.0–52.0)
Hemoglobin: 15.9 g/dL (ref 13.0–17.0)
Immature Granulocytes: 1 %
Lymphocytes Relative: 8 %
Lymphs Abs: 2.4 10*3/uL (ref 0.7–4.0)
MCH: 21.3 pg — ABNORMAL LOW (ref 26.0–34.0)
MCHC: 33.1 g/dL (ref 30.0–36.0)
MCV: 64.6 fL — ABNORMAL LOW (ref 80.0–100.0)
Monocytes Absolute: 2.2 10*3/uL — ABNORMAL HIGH (ref 0.1–1.0)
Monocytes Relative: 7 %
Neutro Abs: 27.3 10*3/uL — ABNORMAL HIGH (ref 1.7–7.7)
Neutrophils Relative %: 84 %
Platelets: 201 10*3/uL (ref 150–400)
RBC: 7.45 MIL/uL — ABNORMAL HIGH (ref 4.22–5.81)
RDW: 18.2 % — ABNORMAL HIGH (ref 11.5–15.5)
WBC: 32.4 10*3/uL — ABNORMAL HIGH (ref 4.0–10.5)
nRBC: 0 % (ref 0.0–0.2)

## 2022-02-02 LAB — BASIC METABOLIC PANEL
Anion gap: 9 (ref 5–15)
BUN: 8 mg/dL (ref 6–20)
CO2: 23 mmol/L (ref 22–32)
Calcium: 9.3 mg/dL (ref 8.9–10.3)
Chloride: 104 mmol/L (ref 98–111)
Creatinine, Ser: 0.94 mg/dL (ref 0.61–1.24)
GFR, Estimated: >60 mL/min (ref >60)
Glucose, Bld: 215 mg/dL — ABNORMAL HIGH (ref 70–99)
Potassium: 3.6 mmol/L (ref 3.5–5.1)
Sodium: 136 mmol/L (ref 135–145)

## 2022-02-02 LAB — LACTIC ACID, PLASMA: Lactic Acid, Venous: 1.9 mmol/L (ref 0.5–1.9)

## 2022-02-02 NOTE — ED Provider Triage Note (Cosign Needed)
Emergency Medicine Provider Triage Evaluation Note  Martin Crawford , a 46 y.o. male  was evaluated in triage.  Pt complains of fever and boil.  Reports that he has been having pain to his status in the left medial thigh for about a week.  The last 3 days he has been having temperature, highest being 99.4.  I asked patient if he has a history of diabetes and he said no but he has it listed in his chart as well as frequent abscesses  Review of Systems  Positive:  Negative:   Physical Exam  BP (!) 170/91 (BP Location: Left Arm)   Pulse (!) 111   Temp 99.3 F (37.4 C) (Oral)   Resp 20   Ht 5\' 9"  (1.753 m)   Wt 104.3 kg   SpO2 100%   BMI 33.97 kg/m  Gen:   Awake, no distress   Resp:  Normal effort  MSK:   Moves extremities without difficulty  Other:  Abscess to the left medial thigh.  Does not appear to track deeper on limited triage exam.  No testicular involvement.  Scarring around the area indicative of previous abscesses  Medical Decision Making  Medically screening exam initiated at 8:28 PM.  Appropriate orders placed.  Nelda Severe Millman was informed that the remainder of the evaluation will be completed by another provider, this initial triage assessment does not replace that evaluation, and the importance of remaining in the ED until their evaluation is complete.     Rhae Hammock, PA-C 02/02/22 2029

## 2022-02-02 NOTE — ED Triage Notes (Signed)
Pt c/o fever and multiple abscesses to left buttocks/groin area x 3 days with fever up to 101.2

## 2022-06-21 ENCOUNTER — Emergency Department (HOSPITAL_COMMUNITY)
Admission: EM | Admit: 2022-06-21 | Discharge: 2022-06-22 | Disposition: A | Discharge status: Home / Self Care | Payer: Commercial Managed Care - HMO | Attending: Emergency Medicine | Admitting: Emergency Medicine

## 2022-06-21 ENCOUNTER — Other Ambulatory Visit: Payer: Self-pay

## 2022-06-21 ENCOUNTER — Encounter (HOSPITAL_COMMUNITY): Payer: Self-pay

## 2022-06-21 DIAGNOSIS — M545 Low back pain, unspecified: Secondary | ICD-10-CM | POA: Diagnosis not present

## 2022-06-21 NOTE — ED Triage Notes (Signed)
Pt. Arrives POV for back pain x2 days. Pt. States that it is a constant pain. Pt. Has tried topical pain relief, but has not had any relief. He states that any kind of movement makes it worse. Denies radiation of pain. Denies having any falls.

## 2022-06-22 MED ORDER — KETOROLAC TROMETHAMINE 15 MG/ML IJ SOLN
15.0000 mg | Freq: Once | INTRAMUSCULAR | Status: AC
  Administered 2022-06-22: 15 mg via INTRAMUSCULAR
  Filled 2022-06-22: qty 1

## 2022-06-22 MED ORDER — LIDOCAINE 5 % EX PTCH
1.0000 | MEDICATED_PATCH | CUTANEOUS | Status: DC
  Administered 2022-06-22: 1 via TRANSDERMAL
  Filled 2022-06-22: qty 1

## 2022-06-22 MED ORDER — CYCLOBENZAPRINE HCL 10 MG PO TABS
10.0000 mg | ORAL_TABLET | Freq: Once | ORAL | Status: AC
  Administered 2022-06-22: 10 mg via ORAL
  Filled 2022-06-22: qty 1

## 2022-06-22 MED ORDER — DICLOFENAC SODIUM 1 % EX GEL: 2.0000 g | Freq: Four times a day (QID) | CUTANEOUS | 0 refills | Status: DC

## 2022-06-22 MED ORDER — METHOCARBAMOL 500 MG PO TABS: 500.0000 mg | ORAL_TABLET | Freq: Two times a day (BID) | ORAL | 0 refills | Status: DC

## 2022-06-22 NOTE — Discharge Instructions (Addendum)
Voltaren topically as prescribed. Robaxin as directed, do not drive or operate machinery while taking this medication.  Warm compresses for 20 minutes at a time, followed by gentle stretching.  Follow up with your PCP as planned. Return to ER for worsening or concerning symptoms.

## 2022-06-22 NOTE — ED Provider Notes (Signed)
[facility] Provider Note   CSN: [id number] Arrival date & time: 06/21/22  2030     History  Chief Complaint  Patient presents with   Back Pain    Martin Crawford is a 47 y.o. male.  47 year old male with complaint of low back pain x 2 days without fall, injury, fever, abdominal pain, saddle paresthesia, leg weakness, history of IVDA. No improvement with lidoderm and bengay. Reports stiffness in the morning when getting out of bed. Is scheduled to follow up with PCP.        Home Medications Prior to Admission medications   Medication Sig Start Date End Date Taking? Authorizing Provider  diclofenac Sodium (VOLTAREN) 1 % GEL Apply 2 g topically 4 (four) times daily. 06/22/22  Yes Tacy Learn, PA-C  methocarbamol (ROBAXIN) 500 MG tablet Take 1 tablet (500 mg total) by mouth 2 (two) times daily. 06/22/22  Yes Tacy Learn, PA-C  acetaminophen (TYLENOL) 500 MG tablet Take 2 tablets (1,000 mg total) by mouth every 6 (six) hours. 07/14/20   Raiford Noble Latif, DO  feeding supplement, GLUCERNA SHAKE, (GLUCERNA SHAKE) LIQD Take 237 mLs by mouth 2 (two) times daily between meals. 07/14/20   Raiford Noble Latif, DO  HYDROcodone-acetaminophen (NORCO/VICODIN) 5-325 MG tablet Take 1 tablet by mouth 2 (two) times daily as needed. 11/17/21   Raspet, Derry Skill, PA-C  hydrocortisone-pramoxine (ANALPRAM-HC) 2.5-1 % rectal cream Place 1 application rectally every 6 (six) hours as needed for hemorrhoids. 07/14/20   Raiford Noble Latif, DO  lidocaine (LIDODERM) 5 % Place 1 patch onto the skin daily. Remove & Discard patch within 12 hours or as directed by MD 07/16/21   Tegeler, Gwenyth Allegra, MD  lip balm (CARMEX) ointment Apply 1 application topically 2 (two) times daily. 07/14/20   Raiford Noble Latif, DO  metFORMIN (GLUCOPHAGE) 500 MG tablet Take 1 tablet (500 mg total) by mouth 2 (two) times daily with a meal. Patient not taking: Reported on 07/13/2020  03/10/19   Fulp, Ander Gaster, MD  mupirocin ointment (BACTROBAN) 2 % Apply 1 Application topically 2 (two) times daily. 11/17/21   Raspet, Derry Skill, PA-C  nicotine (NICODERM CQ - DOSED IN MG/24 HOURS) 21 mg/24hr patch Place 1 patch (21 mg total) onto the skin daily. 07/15/20   Raiford Noble Latif, DO  ondansetron (ZOFRAN) 4 MG tablet Take 1 tablet (4 mg total) by mouth every 6 (six) hours as needed for nausea. 07/14/20   Raiford Noble Latif, DO  psyllium (HYDROCIL/METAMUCIL) 95 % PACK Take 1 packet by mouth 2 (two) times daily. 07/14/20   Kerney Elbe, DO  witch hazel-glycerin (TUCKS) pad Apply 1 application topically as needed for irritation or hemorrhoids. 07/14/20   Kerney Elbe, DO      Allergies    Patient has no known allergies.    Review of Systems   Review of Systems Negative except as per HPI Physical Exam Updated Vital Signs BP (!) 126/94 (BP Location: Left Arm)   Pulse 62   Temp 97.6 F (36.4 C)   Resp 16   Ht '5\' 9"'$  (1.753 m)   Wt 104.3 kg   SpO2 98%   BMI 33.97 kg/m  Physical Exam Vitals and nursing note reviewed.  Constitutional:      General: He is not in acute distress.    Appearance: He is well-developed. He is not diaphoretic.  HENT:     Head: Normocephalic and atraumatic.  Cardiovascular:     Pulses: Normal pulses.  Pulmonary:     Effort: Pulmonary effort is normal.  Abdominal:     Palpations: Abdomen is soft.     Tenderness: There is no abdominal tenderness.  Musculoskeletal:        General: Tenderness present.     Thoracic back: No tenderness or bony tenderness.     Lumbar back: Tenderness present. No bony tenderness. Negative right straight leg raise test and negative left straight leg raise test.       Back:     Right lower leg: No edema.     Left lower leg: No edema.     Comments: Tenderness with palpation of lipoma to lower back area, no overlying erythema   Skin:    General: Skin is warm and dry.     Findings: No erythema or rash.   Neurological:     Mental Status: He is alert and oriented to person, place, and time.     Sensory: No sensory deficit.     Motor: No weakness.     Gait: Gait normal.     Deep Tendon Reflexes: Reflexes normal.  Psychiatric:        Behavior: Behavior normal.     ED Results / Procedures / Treatments   Labs (all labs ordered are listed, but only abnormal results are displayed) Labs Reviewed - No data to display  EKG None  Radiology No results found.  Procedures Procedures    Medications Ordered in ED Medications  lidocaine (LIDODERM) 5 % 1 patch (1 patch Transdermal Patch Applied 06/22/22 0059)  ketorolac (TORADOL) 15 MG/ML injection 15 mg (15 mg Intramuscular Given 06/22/22 0056)  cyclobenzaprine (FLEXERIL) tablet 10 mg (10 mg Oral Given 06/22/22 0102)    ED Course/ Medical Decision Making/ A&P                             Medical Decision Making Risk Prescription drug management.   47 year old male presents with complaint of low back pain x 2 days as above. No red flag symptoms. Found to have mild lumbar paraspinal tenderness, no bony tenderness, reflexes symmetric, leg strength symmetric, gait intact. Plan is to treat with Toradol, Flexeril, Lidoderm, recommend warm compresses and gentle stretching at home for strain. Rx for Robaxin, Voltaren gel. Follow up with PCP as scheduled, return for worsening or concerning symptoms.         Final Clinical Impression(s) / ED Diagnoses Final diagnoses:  Acute bilateral low back pain without sciatica    Rx / DC Orders ED Discharge Orders          Ordered    diclofenac Sodium (VOLTAREN) 1 % GEL  4 times daily        06/22/22 0128    methocarbamol (ROBAXIN) 500 MG tablet  2 times daily        06/22/22 0128              Tacy Learn, PA-C 06/22/22 0147    Fatima Blank, MD 06/22/22 [phone]

## 2022-07-03 ENCOUNTER — Encounter: Payer: Self-pay | Admitting: Family Medicine

## 2022-07-03 ENCOUNTER — Ambulatory Visit: Payer: Commercial Managed Care - HMO | Admitting: Family Medicine

## 2022-07-03 VITALS — BP 151/88 | HR 80 | Temp 97.9°F | Resp 18 | Ht 69.0 in | Wt 233.0 lb

## 2022-07-03 DIAGNOSIS — M5442 Lumbago with sciatica, left side: Secondary | ICD-10-CM

## 2022-07-03 DIAGNOSIS — J4520 Mild intermittent asthma, uncomplicated: Secondary | ICD-10-CM

## 2022-07-03 DIAGNOSIS — E119 Type 2 diabetes mellitus without complications: Secondary | ICD-10-CM

## 2022-07-03 DIAGNOSIS — Z114 Encounter for screening for human immunodeficiency virus [HIV]: Secondary | ICD-10-CM

## 2022-07-03 DIAGNOSIS — R221 Localized swelling, mass and lump, neck: Secondary | ICD-10-CM

## 2022-07-03 DIAGNOSIS — Z1159 Encounter for screening for other viral diseases: Secondary | ICD-10-CM

## 2022-07-03 DIAGNOSIS — G8929 Other chronic pain: Secondary | ICD-10-CM

## 2022-07-03 DIAGNOSIS — R030 Elevated blood-pressure reading, without diagnosis of hypertension: Secondary | ICD-10-CM

## 2022-07-03 DIAGNOSIS — E782 Mixed hyperlipidemia: Secondary | ICD-10-CM

## 2022-07-03 DIAGNOSIS — Z23 Encounter for immunization: Secondary | ICD-10-CM

## 2022-07-03 DIAGNOSIS — Z720 Tobacco use: Secondary | ICD-10-CM

## 2022-07-03 DIAGNOSIS — Z1211 Encounter for screening for malignant neoplasm of colon: Secondary | ICD-10-CM

## 2022-07-03 DIAGNOSIS — R7989 Other specified abnormal findings of blood chemistry: Secondary | ICD-10-CM

## 2022-07-03 LAB — LIPID PANEL
Cholesterol: 206 mg/dL — ABNORMAL HIGH (ref 0–200)
HDL: 37.8 mg/dL — ABNORMAL LOW (ref >39.00)
LDL Cholesterol: 140 mg/dL — ABNORMAL HIGH (ref 0–99)
NonHDL: 167.8
Total CHOL/HDL Ratio: 5
Triglycerides: 139 mg/dL (ref 0.0–149.0)
VLDL: 27.8 mg/dL (ref 0.0–40.0)

## 2022-07-03 LAB — CBC WITH DIFFERENTIAL/PLATELET
Basophils Absolute: 0.1 10*3/uL (ref 0.0–0.1)
Basophils Relative: 0.5 % (ref 0.0–3.0)
Eosinophils Absolute: 0.4 10*3/uL (ref 0.0–0.7)
Eosinophils Relative: 3.1 % (ref 0.0–5.0)
HCT: 50.1 % (ref 39.0–52.0)
Hemoglobin: 16.6 g/dL (ref 13.0–17.0)
Lymphocytes Relative: 17.3 % (ref 12.0–46.0)
Lymphs Abs: 2.5 10*3/uL (ref 0.7–4.0)
MCHC: 33.1 g/dL (ref 30.0–36.0)
MCV: 64.9 fl — ABNORMAL LOW (ref 78.0–100.0)
Monocytes Absolute: 1 10*3/uL (ref 0.1–1.0)
Monocytes Relative: 6.7 % (ref 3.0–12.0)
Neutro Abs: 10.4 10*3/uL — ABNORMAL HIGH (ref 1.4–7.7)
Neutrophils Relative %: 72.4 % (ref 43.0–77.0)
Platelets: 220 10*3/uL (ref 150.0–400.0)
RBC: 7.72 Mil/uL — ABNORMAL HIGH (ref 4.22–5.81)
RDW: 16.5 % — ABNORMAL HIGH (ref 11.5–15.5)
WBC: 14.4 10*3/uL — ABNORMAL HIGH (ref 4.0–10.5)

## 2022-07-03 LAB — COMPREHENSIVE METABOLIC PANEL
ALT: 18 U/L (ref 0–53)
AST: 16 U/L (ref 0–37)
Albumin: 4.2 g/dL (ref 3.5–5.2)
Alkaline Phosphatase: 96 U/L (ref 39–117)
BUN: 16 mg/dL (ref 6–23)
CO2: 27 mEq/L (ref 19–32)
Calcium: 9.9 mg/dL (ref 8.4–10.5)
Chloride: 102 mEq/L (ref 96–112)
Creatinine, Ser: 0.87 mg/dL (ref 0.40–1.50)
GFR: 104.08 mL/min (ref >60.00)
Glucose, Bld: 128 mg/dL — ABNORMAL HIGH (ref 70–99)
Potassium: 4.3 mEq/L (ref 3.5–5.1)
Sodium: 138 mEq/L (ref 135–145)
Total Bilirubin: 0.5 mg/dL (ref 0.2–1.2)
Total Protein: 7.8 g/dL (ref 6.0–8.3)

## 2022-07-03 LAB — HEMOGLOBIN A1C: Hgb A1c MFr Bld: 7.4 % — ABNORMAL HIGH (ref 4.6–6.5)

## 2022-07-03 LAB — MICROALBUMIN / CREATININE URINE RATIO
Creatinine,U: 86.6 mg/dL
Microalb Creat Ratio: 2.1 mg/g (ref 0.0–30.0)
Microalb, Ur: 1.8 mg/dL (ref 0.0–1.9)

## 2022-07-03 LAB — TSH: TSH: 0.56 u[IU]/mL (ref 0.35–5.50)

## 2022-07-03 NOTE — Assessment & Plan Note (Signed)
Hesitant to start NSAIDs given high blood pressure.  Recommend Tylenol, ice, heat, massage, home exercises.  He is willing to see physical therapy.

## 2022-07-03 NOTE — Assessment & Plan Note (Signed)
Stable ?No recent exacerbation ?
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
## 2022-07-03 NOTE — Patient Instructions (Addendum)
Blood pressure is not at goal for age and co-morbidities.   Recommendations: monitor at home and follow-up in 2-3 weeks to recheck  - BP goal <130/80 - monitor and log blood pressures at home - check around the same time each day in a relaxed setting - Limit salt to <2000 mg/day - Follow DASH eating plan (heart healthy diet) - limit alcohol to 2 standard drinks per day for men and 1 per day for women - avoid tobacco products - get at least 2 hours of regular aerobic exercise weekly Patient aware of signs/symptoms requiring further/urgent evaluation. Labs updated today.   Schedule Ultrasound for your neck. Labs today Referral to PT for your back pain.  Continue muscle relaxer, home exercises, heat, lidocaine patches, topical creams. BP is too high to add an antiinflammatory right now.   -----------------------------------------------   Thank you for choosing [facility] at [facility] for your Primary Care needs. I am excited for the opportunity to partner with you to meet your health care goals. It was a pleasure meeting you today!  Information on diet, exercise, and health maintenance recommendations are listed below. This is information to help you be sure you are on track for optimal health and monitoring.   Please look over this and let us know if you have any questions or if you have completed any of the health maintenance outside of [place] so that we can be sure your records are up to date.  ___________________________________________________________  MyChart:  For all urgent or time sensitive needs we ask that you please call the office to avoid delays. Our number is [phone]. MyChart is not constantly monitored and due to the large volume of messages a day, replies may take up to 72 business hours.  MyChart Policy: MyChart allows for you to see your visit notes, after visit summary, provider recommendations, lab and tests results, make an  appointment, request refills, and contact your provider or the office for non-urgent questions or concerns. Providers are seeing patients during normal business hours and do not have built in time to review MyChart messages.  We ask that you allow a minimum of 3 business days for responses to [employer]. For this reason, please do not send urgent requests through [place]. Please call the office at [phone]. New and ongoing conditions may require a visit. We have virtual and in-person visits available for your convenience.  Complex MyChart concerns may require a visit. Your provider may request you schedule a virtual or in-person visit to ensure we are providing the best care possible. MyChart messages sent after 11:00 AM on Friday will not be received by the provider until Monday morning.    Lab and Test Results: You will receive your lab and test results on MyChart as soon as they are completed and results have been sent by the lab or testing facility. Due to this service, you will receive your results BEFORE your provider.  I review lab and test results each morning prior to seeing patients. Some results require collaboration with other providers to ensure you are receiving the most appropriate care. For this reason, we ask that you please allow a minimum of 3-5 business days from the time that ALL results have been received for your provider to receive and review lab and test results and contact you about these.  Most lab and test result comments from the provider will be sent through [place]. Your provider may recommend changes to the plan  of care, follow-up visits, repeat testing, ask questions, or request an office visit to discuss these results. You may reply directly to this message or call the office to provide information for the provider or set up an appointment. In some instances, you will be called with test results and recommendations. Please let us know if this is preferred and we  will make note of this in your chart to provide this for you.    If you have not heard a response to your lab or test results in 5 business days from all results returning to [place], please call the office to let us know. We ask that you please avoid calling prior to this time unless there is an emergent concern. Due to high call volumes, this can delay the resulting process.  After Hours: For all non-emergency after hours needs, please call the office at [phone] and select the option to reach the on-call  service. On-call services are shared between multiple [place] offices and therefore it will not be possible to speak directly with your provider. On-call providers may provide medical advice and recommendations, but are unable to provide refills for maintenance medications.  For all emergency or urgent medical needs after normal business hours, we recommend that you seek care at the closest Urgent Care or Emergency Department to ensure appropriate treatment in a timely manner.  [facility] has a 24 hour emergency room located on the ground floor for your convenience.   Urgent Concerns During the Business Day Providers are seeing patients from 8AM to [place] with a busy schedule and are most often not able to respond to non-urgent calls until the end of the day or the next business day. If you should have URGENT concerns during the day, please call and speak to the nurse or schedule a same day appointment so that we can address your concern without delay.   Thank you, again, for choosing me as your health care partner. I appreciate your trust and look forward to learning more about you!   Purcell Nails Olevia Bowens, DNP, FNP-C  ___________________________________________________________  Health Maintenance Recommendations Screening Testing Mammogram Every 1-2 years based on history and risk factors Starting at age 75 Pap Smear Ages 21-39 every 3 years Ages 28-65 every 5 years with HPV  testing More frequent testing may be required based on results and history Colon Cancer Screening Every 1-10 years based on test performed, risk factors, and history Starting at age 26 Bone Density Screening Every 2-10 years based on history Starting at age 62 for women Recommendations for men differ based on medication usage, history, and risk factors AAA Screening One time ultrasound Men 18-56 years old who have ever smoked Lung Cancer Screening Low Dose Lung CT every 12 months Age 40-80 years with a 20 pack-year smoking history who still smoke or who have quit within the last 15 years  Screening Labs Routine  Labs: Complete Blood Count (CBC), Complete Metabolic Panel (CMP), Cholesterol (Lipid Panel) Every 6-12 months based on history and medications May be recommended more frequently based on current conditions or previous results Hemoglobin A1c Lab Every 3-12 months based on history and previous results Starting at age 93 or earlier with diagnosis of diabetes, high cholesterol, BMI >26, and/or risk factors Frequent monitoring for patients with diabetes to ensure blood sugar control Thyroid Panel  Every 6 months based on history, symptoms, and risk factors May be repeated more often if on medication HIV One time testing for all  patients 7 and older May be repeated more frequently for patients with increased risk factors or exposure Hepatitis C One time testing for all patients 45 and older May be repeated more frequently for patients with increased risk factors or exposure Gonorrhea, Chlamydia Every 12 months for all sexually active persons 13-24 years Additional monitoring may be recommended for those who are considered high risk or who have symptoms PSA Men 89-63 years old with risk factors Additional screening may be recommended from age 56-69 based on risk factors, symptoms, and history  Vaccine Recommendations Tetanus Booster All adults every 10 years Flu  Vaccine All patients 6 months and older every year COVID Vaccine All patients 12 years and older Initial dosing with booster May recommend additional booster based on age and health history HPV Vaccine 2 doses all patients age 51-26 Dosing may be considered for patients over 26 Shingles Vaccine (Shingrix) 2 doses all adults 66 years and older Pneumonia (Pneumovax 14) All adults 29 years and older May recommend earlier dosing based on health history Pneumonia (Prevnar 72) All adults 37 years and older Dosed 1 year after Pneumovax 23 Pneumonia (Prevnar 53) All adults 43 years and older (adults [id number] with certain conditions or risk factors) 1 dose  For those who have not received Prevnar 13 vaccine previously   Additional Screening, Testing, and Vaccinations may be recommended on an individualized basis based on family history, health history, risk factors, and/or exposure.  __________________________________________________________  Diet Recommendations for All Patients  I recommend that all patients maintain a diet low in saturated fats, carbohydrates, and cholesterol. While this can be challenging at first, it is not impossible and small changes can make big differences.  Things to try: Decreasing the amount of soda, sweet tea, and/or juice to one or less per day and replace with water While water is always the first choice, if you do not like water you may consider adding a water additive without sugar to improve the taste other sugar free drinks Replace potatoes with a brightly colored vegetable  Use healthy oils, such as canola oil or olive oil, instead of butter or hard margarine Limit your bread intake to two pieces or less a day Replace regular pasta with low carb pasta options Bake, broil, or grill foods instead of frying Monitor portion sizes  Eat smaller, more frequent meals throughout the day instead of large meals  An important thing to remember is, if you love  foods that are not great for your health, you don't have to give them up completely. Instead, allow these foods to be a reward when you have done well. Allowing yourself to still have special treats every once in a while is a nice way to tell yourself thank you for working hard to keep yourself healthy.   Also remember that every day is a new day. If you have a bad day and "fall off the wagon", you can still climb right back up and keep moving along on your journey!  We have resources available to help you!  Some websites that may be helpful include: www.http://carter.biz/  Www.VeryWellFit.com _____________________________________________________________  Activity Recommendations for All Patients  I recommend that all adults get at least 20 minutes of moderate physical activity that elevates your heart rate at least 5 days out of the week.  Some examples include: Walking or jogging at a pace that allows you to carry on a conversation Cycling (stationary bike or outdoors) Water aerobics Yoga Weight lifting Dancing If physical limitations  prevent you from putting stress on your joints, exercise in a pool or seated in a chair are excellent options.  Do determine your MAXIMUM heart rate for activity: 220 - YOUR AGE = MAX Heart Rate   Remember! Do not push yourself too hard.  Start slowly and build up your pace, speed, weight, time in exercise, etc.  Allow your body to rest between exercise and get good sleep. You will need more water than normal when you are exerting yourself. Do not wait until you are thirsty to drink. Drink with a purpose of getting in at least 8, 8 ounce glasses of water a day plus more depending on how much you exercise and sweat.    If you begin to develop dizziness, chest pain, abdominal pain, jaw pain, shortness of breath, headache, vision changes, lightheadedness, or other concerning symptoms, stop the activity and allow your body to rest. If your symptoms are severe,  seek emergency evaluation immediately. If your symptoms are concerning, but not severe, please let us know so that we can recommend further evaluation.

## 2022-07-03 NOTE — Assessment & Plan Note (Signed)
Updating labs today.  Currently only doing lifestyle measures. Referral for eye exam placed Discussed lifestyle measures.

## 2022-07-03 NOTE — Assessment & Plan Note (Signed)
Encouraged smoking cessation 

## 2022-07-03 NOTE — Assessment & Plan Note (Signed)
Present for 10+ years with no changes.  He has never had workup and would like to have ultrasound today.  No other symptoms.

## 2022-07-04 ENCOUNTER — Telehealth (HOSPITAL_BASED_OUTPATIENT_CLINIC_OR_DEPARTMENT_OTHER): Payer: Self-pay

## 2022-07-04 LAB — HIV ANTIBODY (ROUTINE TESTING W REFLEX): HIV 1&2 Ab, 4th Generation: NONREACTIVE

## 2022-07-04 LAB — HEPATITIS C ANTIBODY: Hepatitis C Ab: NONREACTIVE

## 2022-07-04 MED ORDER — METFORMIN HCL 500 MG PO TABS: 500.0000 mg | ORAL_TABLET | Freq: Two times a day (BID) | ORAL | 3 refills | Status: DC

## 2022-07-04 MED ORDER — ATORVASTATIN CALCIUM 10 MG PO TABS: 10.0000 mg | ORAL_TABLET | Freq: Every day | ORAL | 1 refills | Status: DC

## 2022-07-04 NOTE — Addendum Note (Signed)
Addended by: Caleen Jobs B on: 07/04/2022 10:25 AM   Modules accepted: Orders

## 2022-07-11 ENCOUNTER — Ambulatory Visit (HOSPITAL_BASED_OUTPATIENT_CLINIC_OR_DEPARTMENT_OTHER)
Admission: RE | Admit: 2022-07-11 | Discharge: 2022-07-11 | Disposition: A | Discharge status: Home / Self Care | Payer: Commercial Managed Care - HMO | Source: Ambulatory Visit | Attending: Family Medicine | Admitting: Family Medicine

## 2022-07-11 ENCOUNTER — Encounter: Payer: Self-pay | Admitting: Family

## 2022-07-11 ENCOUNTER — Inpatient Hospital Stay (HOSPITAL_BASED_OUTPATIENT_CLINIC_OR_DEPARTMENT_OTHER): Payer: Commercial Managed Care - HMO | Admitting: Family

## 2022-07-11 ENCOUNTER — Other Ambulatory Visit: Payer: Self-pay | Admitting: Family

## 2022-07-11 ENCOUNTER — Inpatient Hospital Stay: Payer: Commercial Managed Care - HMO | Attending: Hematology & Oncology

## 2022-07-11 VITALS — BP 140/95 | HR 90 | Temp 98.1°F | Resp 17 | Ht 69.0 in | Wt 232.0 lb

## 2022-07-11 DIAGNOSIS — R221 Localized swelling, mass and lump, neck: Secondary | ICD-10-CM | POA: Insufficient documentation

## 2022-07-11 DIAGNOSIS — D72829 Elevated white blood cell count, unspecified: Secondary | ICD-10-CM

## 2022-07-11 DIAGNOSIS — F17210 Nicotine dependence, cigarettes, uncomplicated: Secondary | ICD-10-CM | POA: Insufficient documentation

## 2022-07-11 DIAGNOSIS — E119 Type 2 diabetes mellitus without complications: Secondary | ICD-10-CM

## 2022-07-11 DIAGNOSIS — L0501 Pilonidal cyst with abscess: Secondary | ICD-10-CM | POA: Insufficient documentation

## 2022-07-11 DIAGNOSIS — D649 Anemia, unspecified: Secondary | ICD-10-CM

## 2022-07-11 DIAGNOSIS — D751 Secondary polycythemia: Secondary | ICD-10-CM | POA: Diagnosis not present

## 2022-07-11 LAB — CBC WITH DIFFERENTIAL (CANCER CENTER ONLY)
Abs Immature Granulocytes: 0.08 10*3/uL — ABNORMAL HIGH (ref 0.00–0.07)
Basophils Absolute: 0.1 10*3/uL (ref 0.0–0.1)
Basophils Relative: 1 %
Eosinophils Absolute: 0.3 10*3/uL (ref 0.0–0.5)
Eosinophils Relative: 2 %
HCT: 49.5 % (ref 39.0–52.0)
Hemoglobin: 16.6 g/dL (ref 13.0–17.0)
Immature Granulocytes: 1 %
Lymphocytes Relative: 24 %
Lymphs Abs: 3.5 10*3/uL (ref 0.7–4.0)
MCH: 21.4 pg — ABNORMAL LOW (ref 26.0–34.0)
MCHC: 33.5 g/dL (ref 30.0–36.0)
MCV: 63.7 fL — ABNORMAL LOW (ref 80.0–100.0)
Monocytes Absolute: 1 10*3/uL (ref 0.1–1.0)
Monocytes Relative: 7 %
Neutro Abs: 9.8 10*3/uL — ABNORMAL HIGH (ref 1.7–7.7)
Neutrophils Relative %: 65 %
Platelet Count: 221 10*3/uL (ref 150–400)
RBC: 7.77 MIL/uL — ABNORMAL HIGH (ref 4.22–5.81)
RDW: 18.4 % — ABNORMAL HIGH (ref 11.5–15.5)
WBC Count: 14.8 10*3/uL — ABNORMAL HIGH (ref 4.0–10.5)
nRBC: 0 % (ref 0.0–0.2)

## 2022-07-11 LAB — CMP (CANCER CENTER ONLY)
ALT: 14 U/L (ref 0–44)
AST: 14 U/L — ABNORMAL LOW (ref 15–41)
Albumin: 4.3 g/dL (ref 3.5–5.0)
Alkaline Phosphatase: 99 U/L (ref 38–126)
Anion gap: 7 (ref 5–15)
BUN: 15 mg/dL (ref 6–20)
CO2: 29 mmol/L (ref 22–32)
Calcium: 10.1 mg/dL (ref 8.9–10.3)
Chloride: 103 mmol/L (ref 98–111)
Creatinine: 0.92 mg/dL (ref 0.61–1.24)
GFR, Estimated: >60 mL/min (ref >60)
Glucose, Bld: 179 mg/dL — ABNORMAL HIGH (ref 70–99)
Potassium: 4.2 mmol/L (ref 3.5–5.1)
Sodium: 139 mmol/L (ref 135–145)
Total Bilirubin: 0.4 mg/dL (ref 0.3–1.2)
Total Protein: 7.8 g/dL (ref 6.5–8.1)

## 2022-07-11 LAB — RETICULOCYTES
Immature Retic Fract: 12.9 % (ref 2.3–15.9)
RBC.: 7.69 MIL/uL — ABNORMAL HIGH (ref 4.22–5.81)
Retic Count, Absolute: 86.9 10*3/uL (ref 19.0–186.0)
Retic Ct Pct: 1.1 % (ref 0.4–3.1)

## 2022-07-11 LAB — LACTATE DEHYDROGENASE: LDH: 161 U/L (ref 98–192)

## 2022-07-11 LAB — SAVE SMEAR(SSMR), FOR PROVIDER SLIDE REVIEW

## 2022-07-11 NOTE — Progress Notes (Signed)
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  He has a pilonidal abscess that has been drained multiple times throughout the past 8 years. Most recently drained in July 2023.  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 He is scheduled for [place] to better assess this later today at 4 pm.  He is a smoker, 1 ppd.  No known history of sickle cell disease or trait.  No history of thyroid disease.  He is prediabetic and is taking Metformin.  No personal or known familial history of cancer.  He states that he has occasional dark tarry stools most recently noted this week on Monday. He states he is scheduled for the Cologuard. No fever, chills, n/v, cough, rash, dizziness, SOB, chest pain, palpitations, abdominal pain or changes in bowel or bladder habits.  No swelling, tenderness, numbness or tingling in her extremities.  No falls or syncope.  Appetite and hydration are good. Weight is stable at 232 lbs.    ROS: All other 10 point review of systems is negative.   PAST MEDICAL HISTORY:   Past Medical History:  Diagnosis Date   Asthma    Chronic back pain    Diabetes mellitus type II, non insulin dependent ([place]) 07/13/2020   History of frequent abscesses of skin and subcutaneous tissue 07/13/2020   Pilonidal abscess 09/22/2016    ALLERGIES: No Known Allergies    MEDICATIONS:  Current Outpatient Medications on File Prior to Visit  Medication Sig Dispense Refill   atorvastatin (LIPITOR) 10 MG tablet Take 1 tablet (10 mg total) by mouth daily. 90  tablet 1   metFORMIN (GLUCOPHAGE) 500 MG tablet Take 1 tablet (500 mg total) by mouth 2 (two) times daily with a meal. 180 tablet 3   No current facility-administered medications on file prior to visit.     PAST SURGICAL HISTORY Past Surgical History:  Procedure Laterality Date   INCISION AND DRAINAGE ABSCESS N/A 09/23/2016   Procedure: INCISION AND DRAINAGE ABSCESS PILONIDAL ABCESS;  Surgeon: Excell Seltzer, MD;  Location: [facility];  Service: General;  Laterality: N/A;   INCISION AND DRAINAGE ABSCESS  08/02/2013   buttock abscess - I&D in ED   INCISION AND DRAINAGE ABSCESS N/A 07/13/2020   Procedure: EXAM UNDER ANESTHESIA/EXCISION AND DRAINAGE PERINEAL ABSCESS;  Surgeon: Dwan Bolt, MD;  Location: [facility];  Service: General;  Laterality: N/A;   PILONIDAL CYST DRAINAGE  09/23/2016   pilonidal abscess    FAMILY HISTORY: Family History  Problem Relation Age of Onset   Stroke Mother    Hypertension Mother    Asthma Father    Hypertension Father     SOCIAL HISTORY:  reports that he has been smoking cigarettes. He has a 28.00 pack-year smoking history. He has never used smokeless tobacco. He reports that he does not drink alcohol and does not use drugs.  PERFORMANCE STATUS: The patient's performance status is 0 - Asymptomatic  PHYSICAL EXAM: Most Recent Vital Signs: Blood pressure (!) 140/95, pulse 90, temperature 98.1 F (36.7 C), temperature source  Oral, resp. rate 17, height 5\' 9"  (1.753 m), weight 232 lb (105.2 kg), SpO2 93 %. BP (!) 140/95 (BP Location: Left Arm, Patient Position: Sitting) Comment: states followed by PCP  Pulse 90   Temp 98.1 F (36.7 C) (Oral)   Resp 17   Ht 5\' 9"  (1.753 m)   Wt 232 lb (105.2 kg)   SpO2 93%   BMI 34.26 kg/m   General Appearance:    Alert, cooperative, no distress, appears stated age  Head:    Normocephalic, without obvious abnormality, atraumatic  Eyes:    PERRL, conjunctiva/corneas clear, EOM's intact, fundi    benign, both  eyes             Throat:   Lips, mucosa, and tongue normal; teeth and gums normal  Neck:   Supple, symmetrical, trachea midline, no adenopathy;       thyroid:  No enlargement/tenderness/nodules; no carotid   bruit or JVD  Back:     Symmetric, no curvature, ROM normal, no CVA tenderness  Lungs:     Clear to auscultation bilaterally, respirations unlabored  Chest wall:    No tenderness or deformity  Heart:    Regular rate and rhythm, S1 and S2 normal, no murmur, rub   or gallop  Abdomen:     Soft, non-tender, bowel sounds active all four quadrants,    no masses, no organomegaly        Extremities:   Extremities normal, atraumatic, no cyanosis or edema  Pulses:   2+ and symmetric all extremities  Skin:   Skin color, texture, turgor normal, no rashes or lesions  Lymph nodes:   Cervical, supraclavicular, and axillary nodes normal  Neurologic:   CNII-XII intact. Normal strength, sensation and reflexes      throughout    LABORATORY DATA:  Results for orders placed or performed in visit on 07/11/22 (from the past 48 hour(s))  Reticulocytes     Status: Abnormal   Collection Time: 07/11/22 11:08 AM  Result Value Ref Range   Retic Ct Pct 1.1 0.4 - 3.1 %   RBC. 7.69 (H) 4.22 - 5.81 MIL/uL   Retic Count, Absolute 86.9 19.0 - 186.0 K/uL   Immature Retic Fract 12.9 2.3 - 15.9 %    Comment: Performed at [facility] at [facility], [address], [place], [place] [address]  CBC with Differential ([place] Only)     Status: Abnormal (Preliminary result)   Collection Time: 07/11/22 11:08 AM  Result Value Ref Range   WBC Count 14.8 (H) 4.0 - 10.5 K/uL   RBC 7.77 (H) 4.22 - 5.81 MIL/uL   Hemoglobin 16.6 13.0 - 17.0 g/dL   HCT 49.5 39.0 - 52.0 %   MCV 63.7 (L) 80.0 - 100.0 fL   MCH 21.4 (L) 26.0 - 34.0 pg   MCHC 33.5 30.0 - 36.0 g/dL   RDW 18.4 (H) 11.5 - 15.5 %   Platelet Count 221 150 - 400 K/uL   nRBC 0.0 0.0 - 0.2 %    Comment: Performed at [facility] at [facility], [address], [place], [place] [address]   Neutrophils Relative % PENDING %   Neutro Abs PENDING 1.7 - 7.7 K/uL   Band Neutrophils PENDING %   Lymphocytes Relative PENDING %   Lymphs Abs PENDING 0.7 - 4.0 K/uL   Monocytes Relative PENDING %   Monocytes Absolute PENDING 0.1 - 1.0 K/uL  Eosinophils Relative PENDING %   Eosinophils Absolute PENDING 0.0 - 0.5 K/uL   Basophils Relative PENDING %   Basophils Absolute PENDING 0.0 - 0.1 K/uL   WBC Morphology PENDING    RBC Morphology PENDING    Smear Review PENDING    Other PENDING %   nRBC PENDING 0 /100 WBC   Metamyelocytes Relative PENDING %   Myelocytes PENDING %   Promyelocytes Relative PENDING %   Blasts PENDING %   Immature Granulocytes PENDING %   Abs Immature Granulocytes PENDING 0.00 - 0.07 K/uL      RADIOGRAPHY: No results found.     PATHOLOGY: None   ASSESSMENT/PLAN: Mr. 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  Assessment, cbc and blood smear reviewed with Dr. Marin Olp. Target cells and some large platelets were noted on blood smear.  We have added an epo level and JAK 2 testing.  Hgb fractionation cascade and alpha thalassemia genotype pending.  Follow-up pending results.   All questions were answered. The patient knows to call the clinic with any problems, questions or concerns. We can certainly see the patient much sooner if necessary.  The patient was discussed with Dr. Marin Olp and he is in agreement with the aforementioned.   Lottie Dawson, NP

## 2022-07-12 LAB — ERYTHROPOIETIN: Erythropoietin: 13.4 m[IU]/mL (ref 2.6–18.5)

## 2022-07-17 ENCOUNTER — Ambulatory Visit: Payer: Commercial Managed Care - HMO | Admitting: Family Medicine

## 2022-07-18 LAB — HGB FRACTIONATION BY HPLC
Hgb A2: 3.5 % — ABNORMAL HIGH (ref 1.8–3.2)
Hgb A: 72.6 % — ABNORMAL LOW (ref 96.4–98.8)
Hgb C: 23.9 % — ABNORMAL HIGH
Hgb E: 0 %
Hgb F: 0 % (ref 0.0–2.0)
Hgb S: 0 %
Hgb Variant: 0 %

## 2022-07-18 LAB — HGB FRACTIONATION CASCADE

## 2022-07-22 LAB — JAK 2 V617F (GENPATH)

## 2022-07-23 ENCOUNTER — Telehealth: Payer: Self-pay | Admitting: *Deleted

## 2022-07-23 ENCOUNTER — Telehealth: Payer: Self-pay | Admitting: Family

## 2022-07-23 ENCOUNTER — Ambulatory Visit: Payer: Commercial Managed Care - HMO | Admitting: Family Medicine

## 2022-07-23 NOTE — Telephone Encounter (Signed)
I was able to speak with the patient and go over his recent JAK 2 which did not show any variant of significance. Alpha thalassemia pending. No questions or concerns at this time. Patient appreciative of call.

## 2022-07-23 NOTE — Telephone Encounter (Signed)
Per 07/11/22 LOS - pending lab results

## 2022-07-24 ENCOUNTER — Telehealth: Payer: Self-pay | Admitting: *Deleted

## 2022-07-24 NOTE — Telephone Encounter (Signed)
Per scheduling message Judson Roch - called patient and lvm of upcoming appointments - requested call back to confirm

## 2022-07-30 ENCOUNTER — Emergency Department (HOSPITAL_COMMUNITY)
Admission: EM | Admit: 2022-07-30 | Discharge: 2022-07-31 | Disposition: A | Discharge status: Home / Self Care | Payer: Commercial Managed Care - HMO | Attending: Emergency Medicine | Admitting: Emergency Medicine

## 2022-07-30 ENCOUNTER — Emergency Department (HOSPITAL_COMMUNITY): Payer: Commercial Managed Care - HMO

## 2022-07-30 ENCOUNTER — Encounter (HOSPITAL_COMMUNITY): Payer: Self-pay

## 2022-07-30 DIAGNOSIS — E119 Type 2 diabetes mellitus without complications: Secondary | ICD-10-CM | POA: Insufficient documentation

## 2022-07-30 DIAGNOSIS — W502XXA Accidental twist by another person, initial encounter: Secondary | ICD-10-CM | POA: Diagnosis not present

## 2022-07-30 DIAGNOSIS — S93402A Sprain of unspecified ligament of left ankle, initial encounter: Secondary | ICD-10-CM

## 2022-07-30 DIAGNOSIS — S99912A Unspecified injury of left ankle, initial encounter: Secondary | ICD-10-CM | POA: Diagnosis present

## 2022-07-30 DIAGNOSIS — Z7984 Long term (current) use of oral hypoglycemic drugs: Secondary | ICD-10-CM | POA: Insufficient documentation

## 2022-07-30 NOTE — ED Provider Triage Note (Signed)
Emergency Medicine Provider Triage Evaluation Note  Martin Crawford , a 47 y.o. male  was evaluated in triage.  Pt complains of left ankle injury.   Review of Systems  Positive: Ankle pain Negative: fever  Physical Exam  BP (!) 160/107 (BP Location: Right Arm)   Pulse 81   Temp 98.1 F (36.7 C) (Oral)   Resp 18   Ht 5\' 9"  (1.753 m)   Wt 104.3 kg   SpO2 96%   BMI 33.97 kg/m  Gen:   Awake, no distress   Resp:  Normal effort  MSK:   Moves extremities without difficulty  Other:    Medical Decision Making  Medically screening exam initiated at 11:42 PM.  Appropriate orders placed.  Martin Crawford was informed that the remainder of the evaluation will be completed by another provider, this initial triage assessment does not replace that evaluation, and the importance of remaining in the ED until their evaluation is complete.  Tamala Bari, PA-C 07/30/22 [phone]

## 2022-07-30 NOTE — ED Triage Notes (Signed)
Twisted left ankle on porch PTA. Pt has been weight bearing since injury. C/o pain. Pt states he heard a pop when injury occurred.

## 2022-07-31 NOTE — ED Provider Notes (Signed)
[place] EM[facility] DEPARTMENT AT [facility] Provider Note   CSN: [id number] Arrival date & time: 07/30/22  2024     History  Chief Complaint  Patient presents with   Ankle Pain    Martin Crawford is a 47 y.o. male.  HPI   Patient without significant medical history presenting with complaints of left ankle pain.  Patient states earlier yesterday he was try to get off his porch and twisted, states he caused him to fall onto his butt, denies hitting her head or losing conscious, he is not on anticoag's.  He states he has pain on his lateral malleolus, pain is constant, does not radiate, no associate paresthesia or weakness in his toes, still able to ambulate, he denies any pain in his calf or his Achilles tendon.  He has no other complaints.  Home Medications Prior to Admission medications   Medication Sig Start Date End Date Taking? Authorizing Provider  atorvastatin (LIPITOR) 10 MG tablet Take 1 tablet (10 mg total) by mouth daily. Patient not taking: Reported on 07/31/2022 07/04/22   Clayborne Dana, NP  metFORMIN (GLUCOPHAGE) 500 MG tablet Take 1 tablet (500 mg total) by mouth 2 (two) times daily with a meal. Patient not taking: Reported on 07/31/2022 07/04/22   Clayborne Dana, NP      Allergies    Patient has no known allergies.    Review of Systems   Review of Systems  Constitutional:  Negative for chills and fever.  Respiratory:  Negative for shortness of breath.   Cardiovascular:  Negative for chest pain.  Gastrointestinal:  Negative for abdominal pain.  Musculoskeletal:        Left ankle pain  Neurological:  Negative for headaches.    Physical Exam Updated Vital Signs BP 131/85   Pulse 63   Temp 98.1 F (36.7 C) (Oral)   Resp 16   Ht 5\' 9"  (1.753 m)   Wt 104.3 kg   SpO2 96%   BMI 33.97 kg/m  Physical Exam Vitals and nursing note reviewed.  Constitutional:      General: He is not in acute distress.    Appearance: He is not ill-appearing.   HENT:     Head: Normocephalic and atraumatic.     Nose: No congestion.  Eyes:     Conjunctiva/sclera: Conjunctivae normal.  Cardiovascular:     Rate and Rhythm: Normal rate and regular rhythm.     Pulses: Normal pulses.  Pulmonary:     Effort: Pulmonary effort is normal.  Musculoskeletal:     Right lower leg: No edema.     Left lower leg: No edema.     Comments: Focused exam of the left ankle was unremarkable, there were no gross deformities, no edema or erythema, full range of motion at his hip flexors knee, as well as his ankle and toes, he has point tenderness on his lateral malleolus, without crepitus or deformities noted, there is no calf tenderness no tenderness along the Achilles tendon, sensation intact to light touch, 2+ dorsal pedal pulses, 2-second capillary refill.  Skin:    General: Skin is warm and dry.  Neurological:     Mental Status: He is alert.  Psychiatric:        Mood and Affect: Mood normal.     ED Results / Procedures / Treatments   Labs (all labs ordered are listed, but only abnormal results are displayed) Labs Reviewed - No data to display  EKG None  Radiology DG Ankle Complete Left  Result Date: 07/30/2022 CLINICAL DATA:  Ankle injury EXAM: LEFT ANKLE COMPLETE - 3+ VIEW COMPARISON:  06/27/2014 FINDINGS: No fracture or malalignment. Ankle mortise is symmetric. Mild degenerative changes. Remote medial malleolar fracture. IMPRESSION: No acute osseous abnormality Electronically Signed   By: Jasmine Pang M.D.   On: 07/30/2022 21:43    Procedures Procedures    Medications Ordered in ED Medications - No data to display  ED Course/ Medical Decision Making/ A&P                             Medical Decision Making Amount and/or Complexity of Data Reviewed Radiology: ordered.   This patient presents to the ED for concern of left ankle pain, this involves an extensive number of treatment options, and is a complaint that carries with it a high risk  of complications and morbidity.  The differential diagnosis includes fracture, dislocation, compartment syndrome    Additional history obtained:  Additional history obtained from N/A External records from outside source obtained and reviewed including family care notes   Co morbidities that complicate the patient evaluation  Diabetes  Social Determinants of Health:  N/A    Lab Tests:  I Ordered, and personally interpreted labs.  The pertinent results include: N/A   Imaging Studies ordered:  I ordered imaging studies including DG left ankle I independently visualized and interpreted imaging which showed negative for acute findings I agree with the radiologist interpretation   Cardiac Monitoring:  The patient was maintained on a cardiac monitor.  I personally viewed and interpreted the cardiac monitored which showed an underlying rhythm of: N/A   Medicines ordered and prescription drug management:  I ordered medication including n/a I have reviewed the patients home medicines and have made adjustments as needed  Critical Interventions:  N/a   Reevaluation:  Presents with left ankle pain, triage obtain imaging which I personally viewed unremarkable, benign physical exam, agreement with discharge at this time.  Consultations Obtained:  N/a    Test Considered:  N/a    Rule out I have low suspicion for septic arthritis as patient denies IV drug use, skin exam was performed no erythematous, edematous, warm joints noted on exam, no new heart murmur heard on exam.  Low suspicion for fracture or dislocation as x-ray does not feel any significant findings.  Suspicion for Achilles tendon rupture or partial tear is low at this time no calf tenderness or tenderness along the Achilles tendon, plantarflex without difficulty..  Low suspicion for compartment syndrome as area was palpated it was soft to the touch, neurovascular fully intact.     Dispostion and  problem list  After consideration of the diagnostic results and the patients response to treatment, I feel that the patent would benefit from discharge.  Left ankle pain-likely ankle sprain, will place in a brace, follow-up with orthopedics for further evaluation strict return precautions.            Final Clinical Impression(s) / ED Diagnoses Final diagnoses:  Sprain of left ankle, unspecified ligament, initial encounter    Rx / DC Orders ED Discharge Orders     None         Carroll Sage, PA-C 07/31/22 0034    Gilda Crease, MD 07/31/22 [phone]

## 2022-07-31 NOTE — Discharge Instructions (Signed)
Imaging was reassuring, I suspect you might of sprained ypur left ankle, placed in a brace wear during the day may take off at nighttime.  Recommend keeping elevated, apply ice to the area, use over-the-counter pain medication as needed  Symptoms not improving in a week's time please follow-up with orthopedics for further evaluation.  Come back to the emergency department if you develop chest pain, shortness of breath, severe abdominal pain, uncontrolled nausea, vomiting, diarrhea.

## 2022-07-31 NOTE — ED Notes (Signed)
Pt A&OX4 ambulatory with independent steady gait. Pt verbalized understanding of d/c instructions and follow up care. 

## 2022-08-05 ENCOUNTER — Other Ambulatory Visit: Payer: Self-pay | Admitting: Family

## 2022-08-05 DIAGNOSIS — D563 Thalassemia minor: Secondary | ICD-10-CM

## 2022-08-05 LAB — ALPHA-THALASSEMIA GENOTYPR

## 2022-08-05 MED ORDER — FOLIC ACID 1 MG PO TABS: 1.0000 mg | ORAL_TABLET | Freq: Every day | ORAL | 11 refills | Status: DC

## 2022-08-06 ENCOUNTER — Telehealth: Payer: Self-pay | Admitting: *Deleted

## 2022-08-06 NOTE — Telephone Encounter (Signed)
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 This is why his red cell size is small. Treatment is folic acid. I sent him in a prescription. Thank you!  Sarah  ----- Message ----- From: Leory Plowman, [facility] [place] Sent: 07/11/2022  11:18 AM EDT To: Erenest Blank, NP

## 2022-10-27 ENCOUNTER — Other Ambulatory Visit: Payer: Self-pay | Admitting: Family

## 2022-10-27 DIAGNOSIS — D751 Secondary polycythemia: Secondary | ICD-10-CM

## 2022-10-27 DIAGNOSIS — D563 Thalassemia minor: Secondary | ICD-10-CM

## 2022-10-27 DIAGNOSIS — D72829 Elevated white blood cell count, unspecified: Secondary | ICD-10-CM

## 2022-10-28 ENCOUNTER — Inpatient Hospital Stay: Payer: 59 | Admitting: Family

## 2022-10-28 ENCOUNTER — Inpatient Hospital Stay: Payer: 59 | Attending: Hematology & Oncology

## 2022-11-06 ENCOUNTER — Ambulatory Visit: Payer: 59 | Admitting: Family Medicine

## 2023-02-11 LAB — COLOGUARD

## 2024-05-12 ENCOUNTER — Emergency Department (HOSPITAL_COMMUNITY): Payer: Self-pay

## 2024-05-12 ENCOUNTER — Other Ambulatory Visit: Payer: Self-pay

## 2024-05-12 ENCOUNTER — Observation Stay (HOSPITAL_COMMUNITY)
Admission: EM | Admit: 2024-05-12 | Discharge: 2024-05-13 | Disposition: A | Discharge status: Still In Hospital | Payer: Self-pay | Attending: Family Medicine | Admitting: Family Medicine

## 2024-05-12 DIAGNOSIS — J9601 Acute respiratory failure with hypoxia: Secondary | ICD-10-CM | POA: Insufficient documentation

## 2024-05-12 DIAGNOSIS — E875 Hyperkalemia: Secondary | ICD-10-CM | POA: Diagnosis not present

## 2024-05-12 DIAGNOSIS — N179 Acute kidney failure, unspecified: Secondary | ICD-10-CM | POA: Insufficient documentation

## 2024-05-12 DIAGNOSIS — J189 Pneumonia, unspecified organism: Secondary | ICD-10-CM | POA: Diagnosis not present

## 2024-05-12 DIAGNOSIS — E119 Type 2 diabetes mellitus without complications: Secondary | ICD-10-CM | POA: Diagnosis not present

## 2024-05-12 DIAGNOSIS — J4520 Mild intermittent asthma, uncomplicated: Secondary | ICD-10-CM | POA: Diagnosis not present

## 2024-05-12 DIAGNOSIS — J45909 Unspecified asthma, uncomplicated: Secondary | ICD-10-CM | POA: Insufficient documentation

## 2024-05-12 DIAGNOSIS — Z7984 Long term (current) use of oral hypoglycemic drugs: Secondary | ICD-10-CM | POA: Insufficient documentation

## 2024-05-12 LAB — CBC WITH DIFFERENTIAL/PLATELET
Abs Immature Granulocytes: 0.28 K/uL — ABNORMAL HIGH (ref 0.00–0.07)
Basophils Absolute: 0.1 K/uL (ref 0.0–0.1)
Basophils Relative: 0 %
Eosinophils Absolute: 0 K/uL (ref 0.0–0.5)
Eosinophils Relative: 0 %
HCT: 51.1 % (ref 39.0–52.0)
Hemoglobin: 17.2 g/dL — ABNORMAL HIGH (ref 13.0–17.0)
Immature Granulocytes: 1 %
Lymphocytes Relative: 5 %
Lymphs Abs: 1.6 K/uL (ref 0.7–4.0)
MCH: 22 pg — ABNORMAL LOW (ref 26.0–34.0)
MCHC: 33.7 g/dL (ref 30.0–36.0)
MCV: 65.4 fL — ABNORMAL LOW (ref 80.0–100.0)
Monocytes Absolute: 2 K/uL — ABNORMAL HIGH (ref 0.1–1.0)
Monocytes Relative: 6 %
Neutro Abs: 28.1 K/uL — ABNORMAL HIGH (ref 1.7–7.7)
Neutrophils Relative %: 88 %
Platelets: 200 K/uL (ref 150–400)
RBC: 7.81 MIL/uL — ABNORMAL HIGH (ref 4.22–5.81)
RDW: 19.5 % — ABNORMAL HIGH (ref 11.5–15.5)
WBC: 32.2 K/uL — ABNORMAL HIGH (ref 4.0–10.5)
nRBC: 0 % (ref 0.0–0.2)

## 2024-05-12 LAB — I-STAT CHEM 8, ED
BUN: 13 mg/dL (ref 6–20)
Calcium, Ion: 1.08 mmol/L — ABNORMAL LOW (ref 1.15–1.40)
Chloride: 106 mmol/L (ref 98–111)
Creatinine, Ser: 1.8 mg/dL — ABNORMAL HIGH (ref 0.61–1.24)
Glucose, Bld: 128 mg/dL — ABNORMAL HIGH (ref 70–99)
HCT: 53 % — ABNORMAL HIGH (ref 39.0–52.0)
Hemoglobin: 18 g/dL — ABNORMAL HIGH (ref 13.0–17.0)
Potassium: 6.2 mmol/L — ABNORMAL HIGH (ref 3.5–5.1)
Sodium: 139 mmol/L (ref 135–145)
TCO2: 26 mmol/L (ref 22–32)

## 2024-05-12 LAB — I-STAT CG4 LACTIC ACID, ED
Lactic Acid, Venous: 3.7 mmol/L (ref 0.5–1.9)
Lactic Acid, Venous: 3 mmol/L (ref 0.5–1.9)

## 2024-05-12 LAB — PROTIME-INR
INR: 1.1 (ref 0.8–1.2)
Prothrombin Time: 14.8 s (ref 11.4–15.2)

## 2024-05-12 LAB — RESP PANEL BY RT-PCR (RSV, FLU A&B, COVID)  RVPGX2
Influenza A by PCR: NEGATIVE
Influenza B by PCR: NEGATIVE
Resp Syncytial Virus by PCR: NEGATIVE
SARS Coronavirus 2 by RT PCR: NEGATIVE

## 2024-05-12 LAB — TROPONIN T, HIGH SENSITIVITY
Troponin T High Sensitivity: 21 ng/L — ABNORMAL HIGH (ref 0–19)
Troponin T High Sensitivity: 16 ng/L (ref 0–19)

## 2024-05-12 LAB — CK: Total CK: 143 U/L (ref 49–397)

## 2024-05-12 LAB — PRO BRAIN NATRIURETIC PEPTIDE: Pro Brain Natriuretic Peptide: <50 pg/mL

## 2024-05-12 MED ORDER — IOHEXOL 350 MG/ML SOLN
60.0000 mL | Freq: Once | INTRAVENOUS | Status: AC | PRN
  Administered 2024-05-12: 60 mL via INTRAVENOUS

## 2024-05-12 MED ORDER — AZITHROMYCIN 250 MG PO TABS
500.0000 mg | ORAL_TABLET | Freq: Once | ORAL | Status: AC
  Administered 2024-05-12: 500 mg via ORAL
  Filled 2024-05-12: qty 2

## 2024-05-12 MED ORDER — SODIUM CHLORIDE 0.9 % IV SOLN
2.0000 g | Freq: Once | INTRAVENOUS | Status: AC
  Administered 2024-05-12: 2 g via INTRAVENOUS
  Filled 2024-05-12: qty 20

## 2024-05-12 MED ORDER — LACTATED RINGERS IV BOLUS (SEPSIS)
1000.0000 mL | Freq: Once | INTRAVENOUS | Status: AC
  Administered 2024-05-12: 1000 mL via INTRAVENOUS

## 2024-05-12 MED ORDER — LACTATED RINGERS IV SOLN: INTRAVENOUS | Status: DC

## 2024-05-12 NOTE — ED Notes (Signed)
 Notified RN over the phone of critical istat result

## 2024-05-12 NOTE — ED Notes (Signed)
 Called CT --pt is next to get his CT.

## 2024-05-12 NOTE — ED Triage Notes (Signed)
 Pt. BIB [facility] from home with c/o chest pain that has been going on since last night; Per [facility], the pt. Donated plasma today around 0715 and it made the chest pain worse; The pt. Also had an initial BP of 74/54 with [facility], after some fluids it came up to 80/50; Pt. Is diaphoretic upon assessment and is lethargic. Pt. Has around of NS, 324 ASA, and 4mg s of Zofran  for nausea. Pt. Has a 20G in L AC.

## 2024-05-12 NOTE — ED Provider Notes (Signed)
 " [facility] Provider Note   CSN: [id number] Arrival date & time: 05/12/24  1454     Patient presents with: Chest Pain and Hypotension   Martin Crawford is a 49 y.o. male.   Patient is a 49 year old male with a history of diabetes, hyperlipidemia, chronic back pain, asthma who presents with chest pain.  History obtained from patient and patient's wife at bedside.  He reports chest pain that he describes as left-sided pain is been going on about 2 days.  Has been constant.  However today it got worse.  He went to donate plasma today and it was worse after donating plasma.  He denies any associated shortness of breath.  He does have a little bit of a cough.  No fevers.  It is not worse with exertion although he says it is worse with movements.  It is nonpleuritic.  No leg swelling.  He does have some discomfort in his right calf.  He denies any nausea or vomiting.  The pain does not radiate per his report although his wife said he was complaining of it going to his right shoulder.  He was noted to be hypotensive and altered with EMS.  He had blood pressures in the 70s.  He was given 1200 cc of IV fluids.  His blood pressures improved into the 90s.       Prior to Admission medications  Medication Sig Start Date End Date Taking? Authorizing Provider  folic acid  (FOLVITE ) 1 MG tablet Take 1 tablet (1 mg total) by mouth daily. 08/05/22   Franchot Lauraine HERO, NP  atorvastatin  (LIPITOR) 10 MG tablet Take 1 tablet (10 mg total) by mouth daily. Patient not taking: Reported on 07/31/2022 07/04/22   Almarie Waddell NOVAK, NP  metFORMIN  (GLUCOPHAGE ) 500 MG tablet Take 1 tablet (500 mg total) by mouth 2 (two) times daily with a meal. Patient not taking: Reported on 07/31/2022 07/04/22   Almarie Waddell NOVAK, NP    Allergies: Patient has no known allergies.    Review of Systems  Constitutional:  Positive for fatigue. Negative for chills, diaphoresis and fever.  HENT:   Negative for congestion, rhinorrhea and sneezing.   Eyes: Negative.   Respiratory:  Negative for cough, chest tightness and shortness of breath.   Cardiovascular:  Positive for chest pain. Negative for leg swelling.  Gastrointestinal:  Positive for nausea. Negative for abdominal pain, diarrhea and vomiting.  Genitourinary:  Negative for difficulty urinating, flank pain, frequency and hematuria.  Musculoskeletal:  Negative for arthralgias and back pain.  Skin:  Negative for rash.  Neurological:  Positive for light-headedness. Negative for dizziness, speech difficulty, weakness, numbness and headaches.    Updated Vital Signs BP 112/88   Pulse 71   Temp 97.8 F (36.6 C) (Oral)   Resp (!) 27   SpO2 91%   Physical Exam Constitutional:      Appearance: He is well-developed.  HENT:     Head: Normocephalic and atraumatic.  Eyes:     Pupils: Pupils are equal, round, and reactive to light.  Cardiovascular:     Rate and Rhythm: Normal rate and regular rhythm.     Heart sounds: Normal heart sounds.  Pulmonary:     Effort: Pulmonary effort is normal. No respiratory distress.     Breath sounds: Normal breath sounds. No wheezing or rales.  Chest:     Chest wall: Tenderness (Tenderness to the left dost wall, no crepitus or deformity)  present.  Abdominal:     General: Bowel sounds are normal.     Palpations: Abdomen is soft.     Tenderness: There is no abdominal tenderness. There is no guarding or rebound.  Musculoskeletal:        General: Normal range of motion.     Cervical back: Normal range of motion and neck supple.     Comments: No swelling of his lower extremities, positive tenderness in the right calf  Lymphadenopathy:     Cervical: No cervical adenopathy.  Skin:    General: Skin is warm and dry.     Findings: No rash.  Neurological:     Mental Status: He is alert and oriented to person, place, and time.     (all labs ordered are listed, but only abnormal results are  displayed) Labs Reviewed  CBC WITH DIFFERENTIAL/PLATELET - Abnormal; Notable for the following components:      Result Value   WBC 32.2 (*)    RBC 7.81 (*)    Hemoglobin 17.2 (*)    MCV 65.4 (*)    MCH 22.0 (*)    RDW 19.5 (*)    Neutro Abs 28.1 (*)    Monocytes Absolute 2.0 (*)    Abs Immature Granulocytes 0.28 (*)    All other components within normal limits  I-STAT CHEM 8, ED - Abnormal; Notable for the following components:   Potassium 6.2 (*)    Creatinine, Ser 1.80 (*)    Glucose, Bld 128 (*)    Calcium , Ion 1.08 (*)    Hemoglobin 18.0 (*)    HCT 53.0 (*)    All other components within normal limits  I-STAT CG4 LACTIC ACID, ED - Abnormal; Notable for the following components:   Lactic Acid, Venous 3.7 (*)    All other components within normal limits  I-STAT CG4 LACTIC ACID, ED - Abnormal; Notable for the following components:   Lactic Acid, Venous 3.0 (*)    All other components within normal limits  TROPONIN T, HIGH SENSITIVITY - Abnormal; Notable for the following components:   Troponin T High Sensitivity 21 (*)    All other components within normal limits  RESP PANEL BY RT-PCR (RSV, FLU A&B, COVID)  RVPGX2  CULTURE, BLOOD (ROUTINE X 2)  CULTURE, BLOOD (ROUTINE X 2)  PROTIME-INR  PRO BRAIN NATRIURETIC PEPTIDE  CK  CBC WITH DIFFERENTIAL/PLATELET  URINALYSIS, W/ REFLEX TO CULTURE (INFECTION SUSPECTED)  I-STAT CHEM 8, ED  I-STAT CG4 LACTIC ACID, ED  TROPONIN T, HIGH SENSITIVITY    EKG: EKG Interpretation Date/Time:  Thursday May 12 2024 15:13:56 EST Ventricular Rate:  76 PR Interval:  149 QRS Duration:  91 QT Interval:  436 QTC Calculation: 491 R Axis:   105  Text Interpretation: Sinus rhythm LAE, consider biatrial enlargement Right axis deviation RSR' in V1 or V2, probably normal variant Abnrm T, consider ischemia, anterolateral lds ST elev, probable normal early repol pattern Confirmed by Lenor Hollering [phone]) on 05/12/2024 3:47:41 PM  Radiology: CT  Angio Chest PE W/Cm &/Or Wo Cm Result Date: 05/12/2024 EXAM: CTA of the Chest with contrast for PE 05/12/2024 09:00:00 PM TECHNIQUE: CTA of the chest was performed with the administration of 60 mL of iohexol  (OMNIPAQUE ) 350 MG/ML injection. Multiplanar reformatted images are provided for review. MIP images are provided for review. Automated exposure control, iterative reconstruction, and/or weight based adjustment of the mA/kV was utilized to reduce the radiation dose to as low as reasonably achievable. COMPARISON: None available.  CLINICAL HISTORY: Pulmonary embolism suspected, high probability. Chest pain, hypotension. FINDINGS: PULMONARY ARTERIES: Pulmonary arteries are adequately opacified for evaluation. No pulmonary embolism. Mildly enlarged main pulmonary artery, correlate for pulmonary hypertension. MEDIASTINUM: The heart and pericardium demonstrate no acute abnormality. There is no acute abnormality of the thoracic aorta. LYMPH NODES: No mediastinal, hilar or axillary lymphadenopathy. LUNGS AND PLEURA: Multifocal patchy opacities in the lungs bilaterally, predominantly subpleural, favoring postinfectious or inflammatory scarring. However, there is superimposed patchy opacity in the right lower lobe, suggesting mild infection/pneumonia. No pleural effusion or pneumothorax. UPPER ABDOMEN: Limited images of the upper abdomen are unremarkable. SOFT TISSUES AND BONES: No acute bone or soft tissue abnormality. IMPRESSION: 1. No evidence of pulmonary embolism. 2. Patchy right lower lobe airspace opacity, suspicious for mild infection/pneumonia. Suspected underlying postinfectious/inflammatory scarring. 3. Possible pulmonary hypertension. Electronically signed by: Pinkie Pebbles MD 05/12/2024 09:15 PM EST RP Workstation: [id number]   DG Chest Port 1 View Result Date: 05/12/2024 CLINICAL DATA:  Chest pain EXAM: PORTABLE CHEST 1 VIEW COMPARISON:  01/14/2017 FINDINGS: Mild cardiomegaly. No pleural effusion.  Central vascular congestion and mild diffuse interstitial opacity, greatest in the lower lung zones. IMPRESSION: Cardiomegaly with central vascular congestion and mild diffuse interstitial opacity, this could be due to mild interstitial edema versus interstitial inflammatory process/infection. Electronically Signed   By: Luke Bun M.D.   On: 05/12/2024 15:44     Procedures   Medications Ordered in the ED  lactated ringers  infusion ( Intravenous New Bag/Given 05/12/24 2131)  cefTRIAXone  (ROCEPHIN ) 2 g in sodium chloride  0.9 % 100 mL IVPB (0 g Intravenous Stopped 05/12/24 1808)  azithromycin  (ZITHROMAX ) tablet 500 mg (500 mg Oral Given 05/12/24 1740)  lactated ringers  bolus 1,000 mL (0 mLs Intravenous Stopped 05/12/24 1918)  iohexol  (OMNIPAQUE ) 350 MG/ML injection 60 mL (60 mLs Intravenous Contrast Given 05/12/24 2110)                                    Medical Decision Making Amount and/or Complexity of Data Reviewed Labs: ordered. Radiology: ordered.  Risk Prescription drug management. Decision regarding hospitalization.   This patient presents to the ED for concern of chest pain, this involves an extensive number of treatment options, and is a complaint that carries with it a high risk of complications and morbidity.  I considered the following differential and admission for this acute, potentially life threatening condition.  The differential diagnosis includes ACS, PE, pneumonia, musculoskeletal pain, pneumothorax, pleurisy, myocarditis  MDM:    Patient is a 49 year old who presents with left-sided chest pain.  Been going on for the last couple days but got worse today after he donated plasma.  Had a little bit of coughing.  He was noted to be hypotensive here in the ED with blood pressures in the 80s systolic.  This was after he had gotten 1200 cc of fluids by EMS.  He was given an additional liter of IV fluids and started on an infusion at 125 an hour.  His blood pressure did  slowly improve and currently is 115/78.  His labs show an elevated WBC count.  His lactic acid was elevated as well.  He was started on septic protocol with IV antibiotics.  His chest x-ray shows some concern for pneumonia versus fluid overload.  His BNP was normal and he does not have other symptoms that would be more concerning for CHF.  CTA of his chest was performed  which shows no PE but it does show positive pneumonia.  His oxygen saturation is 90% on room air.  Will place him on 2 L of O2 and plan admission.  His COVID/flu is negative.  Discussed with Dr. Charlton he will admit the patient.  Of note, patient did have some tenderness in his right calf.  I did order a vascular ultrasound but patient refused this.  CRITICAL CARE Performed by: Andrea Ness Total critical care time: 80 minutes Critical care time was exclusive of separately billable procedures and treating other patients. Critical care was necessary to treat or prevent imminent or life-threatening deterioration. Critical care was time spent personally by me on the following activities: development of treatment plan with patient and/or surrogate as well as nursing, discussions with consultants, evaluation of patient's response to treatment, examination of patient, obtaining history from patient or surrogate, ordering and performing treatments and interventions, ordering and review of laboratory studies, ordering and review of radiographic studies, pulse oximetry and re-evaluation of patient's condition.   (Labs, imaging, consults)  Labs: I Ordered, and personally interpreted labs.  The pertinent results include: Elevated WBC count, anemia but hemoglobin is similar to prior values.  He has some chronic leukocytosis but it is more elevated today than his baseline numbers.  Elevated lactic acid, normal BNP, first troponin was minimally elevated, second was normal  Imaging Studies ordered: I ordered imaging studies including chest x-ray,  CTA chest I independently visualized and interpreted imaging. I agree with the radiologist interpretation  Additional history obtained from wife.  External records from outside source obtained and reviewed including prior notes  Cardiac Monitoring: The patient was maintained on a cardiac monitor.  If on the cardiac monitor, I personally viewed and interpreted the cardiac monitored which showed an underlying rhythm of: Sinus rhythm  Reevaluation: After the interventions noted above, I reevaluated the patient and found that they have :improved  Social Determinants of Health:    Disposition: Admit to hospital  Co morbidities that complicate the patient evaluation  Past Medical History:  Diagnosis Date   Asthma    Chronic back pain    Diabetes mellitus type II, non insulin  dependent (HCC) 07/13/2020   History of frequent abscesses of skin and subcutaneous tissue 07/13/2020   Pilonidal abscess 09/22/2016     Medicines Meds ordered this encounter  Medications   lactated ringers  infusion   cefTRIAXone  (ROCEPHIN ) 2 g in sodium chloride  0.9 % 100 mL IVPB    Antibiotic Indication::   CAP   azithromycin  (ZITHROMAX ) tablet 500 mg   lactated ringers  bolus 1,000 mL    Reason 30 mL/kg dose is not being ordered:   Non-Septic Shock Clinical Presentation   iohexol  (OMNIPAQUE ) 350 MG/ML injection 60 mL    I have reviewed the patients home medicines and have made adjustments as needed  Problem List / ED Course: Problem List Items Addressed This Visit   None            Final diagnoses:  None    ED Discharge Orders     None          Ness Andrea, MD 05/12/24 2336  "

## 2024-05-12 NOTE — ED Notes (Signed)
 Pt satting 90% on RA. Placed back on [place] 4L

## 2024-05-13 ENCOUNTER — Other Ambulatory Visit (HOSPITAL_COMMUNITY): Payer: Self-pay

## 2024-05-13 ENCOUNTER — Encounter (HOSPITAL_COMMUNITY): Payer: Self-pay | Admitting: Family Medicine

## 2024-05-13 DIAGNOSIS — E875 Hyperkalemia: Secondary | ICD-10-CM | POA: Diagnosis present

## 2024-05-13 DIAGNOSIS — J189 Pneumonia, unspecified organism: Secondary | ICD-10-CM | POA: Diagnosis not present

## 2024-05-13 DIAGNOSIS — J9601 Acute respiratory failure with hypoxia: Secondary | ICD-10-CM | POA: Diagnosis present

## 2024-05-13 DIAGNOSIS — N179 Acute kidney failure, unspecified: Secondary | ICD-10-CM | POA: Diagnosis present

## 2024-05-13 LAB — URINALYSIS, W/ REFLEX TO CULTURE (INFECTION SUSPECTED)
Bacteria, UA: NONE SEEN
Bilirubin Urine: NEGATIVE
Glucose, UA: NEGATIVE mg/dL
Hgb urine dipstick: NEGATIVE
Ketones, ur: NEGATIVE mg/dL
Leukocytes,Ua: NEGATIVE
Nitrite: NEGATIVE
Protein, ur: NEGATIVE mg/dL
Specific Gravity, Urine: 1.018 (ref 1.005–1.030)
pH: 6 (ref 5.0–8.0)

## 2024-05-13 LAB — BASIC METABOLIC PANEL WITH GFR
Anion gap: 7 (ref 5–15)
BUN: 12 mg/dL (ref 6–20)
CO2: 26 mmol/L (ref 22–32)
Calcium: 8.5 mg/dL — ABNORMAL LOW (ref 8.9–10.3)
Chloride: 107 mmol/L (ref 98–111)
Creatinine, Ser: 1.11 mg/dL (ref 0.61–1.24)
GFR, Estimated: >60 mL/min
Glucose, Bld: 108 mg/dL — ABNORMAL HIGH (ref 70–99)
Potassium: 4.6 mmol/L (ref 3.5–5.1)
Sodium: 140 mmol/L (ref 135–145)

## 2024-05-13 LAB — CBC
HCT: 44.8 % (ref 39.0–52.0)
Hemoglobin: 15.3 g/dL (ref 13.0–17.0)
MCH: 21.9 pg — ABNORMAL LOW (ref 26.0–34.0)
MCHC: 34.2 g/dL (ref 30.0–36.0)
MCV: 64.1 fL — ABNORMAL LOW (ref 80.0–100.0)
Platelets: 191 K/uL (ref 150–400)
RBC: 6.99 MIL/uL — ABNORMAL HIGH (ref 4.22–5.81)
RDW: 18.7 % — ABNORMAL HIGH (ref 11.5–15.5)
WBC: 18.8 K/uL — ABNORMAL HIGH (ref 4.0–10.5)
nRBC: 0 % (ref 0.0–0.2)

## 2024-05-13 LAB — HEMOGLOBIN A1C
Hgb A1c MFr Bld: 6.7 % — ABNORMAL HIGH (ref 4.8–5.6)
Mean Plasma Glucose: 145.59 mg/dL

## 2024-05-13 LAB — MAGNESIUM: Magnesium: 1.8 mg/dL (ref 1.7–2.4)

## 2024-05-13 LAB — HIV ANTIBODY (ROUTINE TESTING W REFLEX): HIV Screen 4th Generation wRfx: NONREACTIVE

## 2024-05-13 LAB — STREP PNEUMONIAE URINARY ANTIGEN: Strep Pneumo Urinary Antigen: NEGATIVE

## 2024-05-13 MED ORDER — ACETAMINOPHEN 325 MG PO TABS: 650.0000 mg | ORAL_TABLET | Freq: Four times a day (QID) | ORAL | Status: DC | PRN

## 2024-05-13 MED ORDER — SODIUM CHLORIDE 0.9 % IV SOLN: 2.0000 g | INTRAVENOUS | Status: DC

## 2024-05-13 MED ORDER — HYDROMORPHONE HCL 1 MG/ML IJ SOLN: 0.5000 mg | INTRAMUSCULAR | Status: DC | PRN

## 2024-05-13 MED ORDER — ENOXAPARIN SODIUM 40 MG/0.4ML IJ SOSY: 40.0000 mg | PREFILLED_SYRINGE | Freq: Every day | INTRAMUSCULAR | Status: DC

## 2024-05-13 MED ORDER — PROCHLORPERAZINE EDISYLATE 10 MG/2ML IJ SOLN: 5.0000 mg | Freq: Four times a day (QID) | INTRAMUSCULAR | Status: DC | PRN

## 2024-05-13 MED ORDER — GUAIFENESIN 100 MG/5ML PO LIQD: 5.0000 mL | ORAL | Status: DC | PRN

## 2024-05-13 MED ORDER — AMOXICILLIN-POT CLAVULANATE 875-125 MG PO TABS
1.0000 | ORAL_TABLET | Freq: Two times a day (BID) | ORAL | 0 refills | Status: AC
  Filled 2024-05-13: qty 14, 7d supply, fill #0

## 2024-05-13 MED ORDER — INSULIN ASPART 100 UNIT/ML IJ SOLN: 0.0000 [IU] | Freq: Three times a day (TID) | INTRAMUSCULAR | Status: DC

## 2024-05-13 MED ORDER — INSULIN ASPART 100 UNIT/ML IJ SOLN: 0.0000 [IU] | Freq: Every day | INTRAMUSCULAR | Status: DC

## 2024-05-13 MED ORDER — SODIUM CHLORIDE 0.9% FLUSH
3.0000 mL | Freq: Two times a day (BID) | INTRAVENOUS | Status: DC
  Administered 2024-05-13: 3 mL via INTRAVENOUS

## 2024-05-13 MED ORDER — OXYCODONE HCL 5 MG PO TABS
5.0000 mg | ORAL_TABLET | ORAL | Status: DC | PRN
  Administered 2024-05-13: 5 mg via ORAL
  Filled 2024-05-13: qty 1

## 2024-05-13 MED ORDER — LACTATED RINGERS IV SOLN: INTRAVENOUS | Status: DC

## 2024-05-13 MED ORDER — AZITHROMYCIN 250 MG PO TABS: 500.0000 mg | ORAL_TABLET | Freq: Every day | ORAL | Status: DC

## 2024-05-13 NOTE — Discharge Summary (Signed)
 Physician Discharge Summary  Martin Crawford FMW:[id number] DOB: 17-Jun-1975 DOA: 05/12/2024  PCP: Pcp, No  Admit date: 05/12/2024 Discharge date: 05/13/2024 30 Day Unplanned Readmission Risk Score    Flowsheet Row ED to Hosp-Admission (Current) from 05/12/2024 in [facility] Emergency Department at [facility]  30 Day Unplanned Readmission Risk Score (%) 12.13 Filed at 05/13/2024 0801    This score is the patient's risk of an unplanned readmission within 30 days of being discharged (0 -100%). The score is based on dignosis, age, lab data, medications, orders, and past utilization.   Low:  0-14.9   Medium: 15-21.9   High: 22-29.9   Extreme: 30 and above          Admitted From: Home Disposition: Home  Recommendations for Outpatient Follow-up:  Follow up with PCP in 1-2 weeks Please obtain BMP/CBC in one week Please follow up with your PCP on the following pending results: Unresulted Labs (From admission, onward)     Start     Ordered   05/20/24 0500  Creatinine, serum  (enoxaparin  (LOVENOX )    CrCl >/= 30 ml/min)  Weekly,   R     Comments: while on enoxaparin  therapy    05/13/24 0311   05/13/24 0500  HIV Antibody (routine testing w rflx)  (HIV Antibody (Routine testing w reflex) panel)  Tomorrow morning,   R        05/13/24 0311   05/13/24 0500  Basic metabolic panel  Daily,   R      05/13/24 0311   05/13/24 0500  CBC  Daily,   R      05/13/24 0311   05/13/24 0500  Lactic acid, plasma  (Lactic Acid)  Tomorrow morning,   R        05/13/24 0311   05/13/24 0311  Legionella Pneumophila Serogp 1 Ur Ag  (COPD / Pneumonia / Cellulitis / Lower Extremity Wound (Diabetic Foot Infection))  Once,   R        05/13/24 0311   05/13/24 0311  Expectorated Sputum Assessment w Gram Stain, Rflx to Resp Cult  (COPD / Pneumonia / Cellulitis / Lower Extremity Wound (Diabetic Foot Infection))  Once,   R        05/13/24 0311   05/12/24 1522  Blood Culture (routine x 2)  (Undifferentiated  presentation (screening labs and basic nursing orders))  BLOOD CULTURE X 2,   STAT      05/12/24 1522              Home Health: None Equipment/Devices: None  Discharge Condition: Stable CODE STATUS: Full code Diet recommendation:  Diet Order             Diet regular Room service appropriate? Yes; Fluid consistency: Thin  Diet effective now                   Subjective: Patient seen and examined, wife at the bedside.  When I entered the room, I asked him how he was feeling.  He says  I feel great and I am ready to go home.  I had a lengthy discussion after that asking his questions and he denied any lack of appetite, generalized weakness, shortness of breath or any other complaint.  He says that he is back to his normal self he does not see any reason to stay in the hospital.  He says he prefers to go home.  I asked him couple of times if he  is being honest with me about his symptoms and he replied that he has he is honest and he feels great.  Due to brief hospitalization, have copied admitting hospitalist HPI and ED course as below.  HPI: Martin Crawford is a 49 y.o. male with medical history significant for type 2 diabetes mellitus, asthma, and pilonidal cyst drainage who presents with cough, loss of appetite, and pleuritic chest pain.   Patient reports that he has been experiencing a loss of appetite and worsening cough.  Symptoms became worse after he donated plasma today and he has developed pleuritic chest pain.  He denies abdominal pain, vomiting, or diarrhea.   EMS found the patient to have a blood pressure of 74/54 and administered 1200 mL of IVF, Zofran , and 324 mg aspirin prior to arrival in the ED.   ED Course: Upon arrival to the ED, patient is found to be afebrile and saturating low 90s on supplemental oxygen with tachypnea, normal HR, and SBP 80 and greater.  Labs are most notable for creatinine 1.8, potassium 6.2, WBC 32.2, hemoglobin 17.2, and lactate 3.7.   CTA chest is negative for PE but concerning for right lower lobe pneumonia.   Blood cultures were collected in the ED and the patient was given a liter of LR, Rocephin , and azithromycin .  Brief/Interim Summary: Patient was briefly hospitalized overnight, was diagnosed with community-acquired pneumonia but he also met criteria for severe sepsis based on leukocytosis, tachypnea, AKI and acute hypoxic respiratory failure.  He also had hyperkalemia 6.2.  Patient received IV Rocephin , Zithromax , IV fluids.  Potassium is normalized, creatinine back to normal, patient has chronic leukocytosis for which she has seen oncologist in the past but no cause has been identified and his baseline white cells remained around 14-18, he came in with 32 white cells and now he is back down to 18.8.  Patient is afebrile.  He has been weaned to room air.  He has no complaints at all.  Urine antigen for streptococci is negative but Legionella antigen is pending.  Electrolytes normal.  Per patient's insistence, discharging him home on 7 days of oral Augmentin .  Discharge plan was discussed with patient and/or family member and they verbalized understanding and agreed with it.  Discharge Diagnoses:  Principal Problem:   Pneumonia Active Problems:   Asthma   Diabetes mellitus type II, non insulin  dependent (HCC)   AKI (acute kidney injury)   Hyperkalemia   Acute respiratory failure with hypoxia [facility])    Discharge Instructions   Allergies as of 05/13/2024   No Known Allergies      Medication List     TAKE these medications    amoxicillin -clavulanate 875-125 MG tablet Commonly known as: AUGMENTIN  Take 1 tablet by mouth 2 (two) times daily for 7 days.        Follow-up Information     PCP Follow up in 1 week(s).                 Allergies[1]  Consultations: None   Procedures/Studies: CT Angio Chest PE W/Cm &/Or Wo Cm Result Date: 05/12/2024 EXAM: CTA of the Chest with contrast for PE  05/12/2024 09:00:00 PM TECHNIQUE: CTA of the chest was performed with the administration of 60 mL of iohexol  (OMNIPAQUE ) 350 MG/ML injection. Multiplanar reformatted images are provided for review. MIP images are provided for review. Automated exposure control, iterative reconstruction, and/or weight based adjustment of the mA/kV was utilized to reduce the radiation dose to as low as  reasonably achievable. COMPARISON: None available. CLINICAL HISTORY: Pulmonary embolism suspected, high probability. Chest pain, hypotension. FINDINGS: PULMONARY ARTERIES: Pulmonary arteries are adequately opacified for evaluation. No pulmonary embolism. Mildly enlarged main pulmonary artery, correlate for pulmonary hypertension. MEDIASTINUM: The heart and pericardium demonstrate no acute abnormality. There is no acute abnormality of the thoracic aorta. LYMPH NODES: No mediastinal, hilar or axillary lymphadenopathy. LUNGS AND PLEURA: Multifocal patchy opacities in the lungs bilaterally, predominantly subpleural, favoring postinfectious or inflammatory scarring. However, there is superimposed patchy opacity in the right lower lobe, suggesting mild infection/pneumonia. No pleural effusion or pneumothorax. UPPER ABDOMEN: Limited images of the upper abdomen are unremarkable. SOFT TISSUES AND BONES: No acute bone or soft tissue abnormality. IMPRESSION: 1. No evidence of pulmonary embolism. 2. Patchy right lower lobe airspace opacity, suspicious for mild infection/pneumonia. Suspected underlying postinfectious/inflammatory scarring. 3. Possible pulmonary hypertension. Electronically signed by: Pinkie Pebbles MD 05/12/2024 09:15 PM EST RP Workstation: [id number]   DG Chest Port 1 View Result Date: 05/12/2024 CLINICAL DATA:  Chest pain EXAM: PORTABLE CHEST 1 VIEW COMPARISON:  01/14/2017 FINDINGS: Mild cardiomegaly. No pleural effusion. Central vascular congestion and mild diffuse interstitial opacity, greatest in the lower lung zones.  IMPRESSION: Cardiomegaly with central vascular congestion and mild diffuse interstitial opacity, this could be due to mild interstitial edema versus interstitial inflammatory process/infection. Electronically Signed   By: Luke Bun M.D.   On: 05/12/2024 15:44     Discharge Exam: Vitals:   05/13/24 0645 05/13/24 0703  BP:  125/87  Pulse:  72  Resp:  (!) 26  Temp: 97.8 F (36.6 C)   SpO2:  97%   Vitals:   05/13/24 0615 05/13/24 0645 05/13/24 0645 05/13/24 0703  BP:  112/80  125/87  Pulse: 68   72  Resp: (!) 24   (!) 26  Temp:   97.8 F (36.6 C)   TempSrc:   Oral   SpO2: 96%   97%    General: Pt is alert, awake, not in acute distress Cardiovascular: RRR, S1/S2 +, no rubs, no gallops Respiratory: Bibasilar rhonchi. Abdominal: Soft, NT, ND, bowel sounds + Extremities: no edema, no cyanosis    The results of significant diagnostics from this hospitalization (including imaging, microbiology, ancillary and laboratory) are listed below for reference.     Microbiology: Recent Results (from the past 240 hours)  Resp panel by RT-PCR (RSV, Flu A&B, Covid) Anterior Nasal Swab     Status: None   Collection Time: 05/12/24  6:34 PM   Specimen: Anterior Nasal Swab  Result Value Ref Range Status   SARS Coronavirus 2 by RT PCR NEGATIVE NEGATIVE Final   Influenza A by PCR NEGATIVE NEGATIVE Final   Influenza B by PCR NEGATIVE NEGATIVE Final    Comment: (NOTE) The Xpert Xpress SARS-CoV-2/FLU/RSV plus assay is intended as an aid in the diagnosis of influenza from Nasopharyngeal swab specimens and should not be used as a sole basis for treatment. Nasal washings and aspirates are unacceptable for Xpert Xpress SARS-CoV-2/FLU/RSV testing.  Fact Sheet for Patients: bloggercourse.com  Fact Sheet for Healthcare Providers: seriousbroker.it  This test is not yet approved or cleared by the [place]  FDA and has been authorized for  detection and/or diagnosis of SARS-CoV-2 by FDA under an Emergency Use Authorization (EUA). This EUA will remain in effect (meaning this test can be used) for the duration of the COVID-19 declaration under Section 564(b)(1) of the Act, 21 U.S.C. section 360bbb-3(b)(1), unless the authorization is terminated or revoked.  Resp Syncytial Virus by PCR NEGATIVE NEGATIVE Final    Comment: (NOTE) Fact Sheet for Patients: bloggercourse.com  Fact Sheet for Healthcare Providers: seriousbroker.it  This test is not yet approved or cleared by the [place]  FDA and has been authorized for detection and/or diagnosis of SARS-CoV-2 by FDA under an Emergency Use Authorization (EUA). This EUA will remain in effect (meaning this test can be used) for the duration of the COVID-19 declaration under Section 564(b)(1) of the Act, 21 U.S.C. section 360bbb-3(b)(1), unless the authorization is terminated or revoked.  Performed at [facility], [address] [place], [place] [address]      Labs: BNP (last 3 results) No results for input(s): BNP in the last 8760 hours. Basic Metabolic Panel: Recent Labs  Lab 05/12/24 1620 05/13/24 0459  NA 139 140  K 6.2* 4.6  CL 106 107  CO2  --  26  GLUCOSE 128* 108*  BUN 13 12  CREATININE 1.80* 1.11  CALCIUM   --  8.5*  MG  --  1.8   Liver Function Tests: No results for input(s): AST, ALT, ALKPHOS, BILITOT, PROT, ALBUMIN in the last 168 hours. No results for input(s): LIPASE, AMYLASE in the last 168 hours. No results for input(s): AMMONIA in the last 168 hours. CBC: Recent Labs  Lab 05/12/24 1552 05/12/24 1620 05/13/24 0459  WBC 32.2*  --  18.8*  NEUTROABS 28.1*  --   --   HGB 17.2* 18.0* 15.3  HCT 51.1 53.0* 44.8  MCV 65.4*  --  64.1*  PLT 200  --  191   Cardiac Enzymes: Recent Labs  Lab 05/12/24 1702  CKTOTAL 143   BNP: Invalid input(s):  POCBNP CBG: No results for input(s): GLUCAP in the last 168 hours. D-Dimer No results for input(s): DDIMER in the last 72 hours. Hgb A1c Recent Labs    05/13/24 0429  HGBA1C 6.7*   Lipid Profile No results for input(s): CHOL, HDL, LDLCALC, TRIG, CHOLHDL, LDLDIRECT in the last 72 hours. Thyroid  function studies No results for input(s): TSH, T4TOTAL, T3FREE, THYROIDAB in the last 72 hours.  Invalid input(s): FREET3 Anemia work up No results for input(s): VITAMINB12, FOLATE, FERRITIN, TIBC, IRON, RETICCTPCT in the last 72 hours. Urinalysis    Component Value Date/Time   COLORURINE YELLOW 05/13/2024 0311   APPEARANCEUR CLEAR 05/13/2024 0311   LABSPEC 1.018 05/13/2024 0311   PHURINE 6.0 05/13/2024 0311   GLUCOSEU NEGATIVE 05/13/2024 0311   HGBUR NEGATIVE 05/13/2024 0311   BILIRUBINUR NEGATIVE 05/13/2024 0311   KETONESUR NEGATIVE 05/13/2024 0311   PROTEINUR NEGATIVE 05/13/2024 0311   UROBILINOGEN 2.0 (H) 08/02/2013 0045   NITRITE NEGATIVE 05/13/2024 0311   LEUKOCYTESUR NEGATIVE 05/13/2024 0311   Sepsis Labs Recent Labs  Lab 05/12/24 1552 05/13/24 0459  WBC 32.2* 18.8*   Microbiology Recent Results (from the past 240 hours)  Resp panel by RT-PCR (RSV, Flu A&B, Covid) Anterior Nasal Swab     Status: None   Collection Time: 05/12/24  6:34 PM   Specimen: Anterior Nasal Swab  Result Value Ref Range Status   SARS Coronavirus 2 by RT PCR NEGATIVE NEGATIVE Final   Influenza A by PCR NEGATIVE NEGATIVE Final   Influenza B by PCR NEGATIVE NEGATIVE Final    Comment: (NOTE) The Xpert Xpress SARS-CoV-2/FLU/RSV plus assay is intended as an aid in the diagnosis of influenza from Nasopharyngeal swab specimens and should not be used as a sole basis for treatment. Nasal washings and aspirates are unacceptable for Xpert  Xpress SARS-CoV-2/FLU/RSV testing.  Fact Sheet for Patients: bloggercourse.com  Fact Sheet for  Healthcare Providers: seriousbroker.it  This test is not yet approved or cleared by the [place]  FDA and has been authorized for detection and/or diagnosis of SARS-CoV-2 by FDA under an Emergency Use Authorization (EUA). This EUA will remain in effect (meaning this test can be used) for the duration of the COVID-19 declaration under Section 564(b)(1) of the Act, 21 U.S.C. section 360bbb-3(b)(1), unless the authorization is terminated or revoked.     Resp Syncytial Virus by PCR NEGATIVE NEGATIVE Final    Comment: (NOTE) Fact Sheet for Patients: bloggercourse.com  Fact Sheet for Healthcare Providers: seriousbroker.it  This test is not yet approved or cleared by the [place]  FDA and has been authorized for detection and/or diagnosis of SARS-CoV-2 by FDA under an Emergency Use Authorization (EUA). This EUA will remain in effect (meaning this test can be used) for the duration of the COVID-19 declaration under Section 564(b)(1) of the Act, 21 U.S.C. section 360bbb-3(b)(1), unless the authorization is terminated or revoked.  Performed at [facility], [address], [place], [place] [address]     FURTHER DISCHARGE INSTRUCTIONS:   Get Medicines reviewed and adjusted: Please take all your medications with you for your next visit with your Primary MD   Laboratory/radiological data: Please request your Primary MD to go over all hospital tests and procedure/radiological results at the follow up, please ask your Primary MD to get all Hospital records sent to his/her office.   In some cases, they will be blood work, cultures and biopsy results pending at the time of your discharge. Please request that your primary care M.D. goes through all the records of your hospital data and follows up on these results.   Also Note the following: If you experience worsening of your admission symptoms, develop  shortness of breath, life threatening emergency, suicidal or homicidal thoughts you must seek medical attention immediately by calling 911 or calling your MD immediately  if symptoms less severe.   You must read complete instructions/literature along with all the possible adverse reactions/side effects for all the Medicines you take and that have been prescribed to you. Take any new Medicines after you have completely understood and accpet all the possible adverse reactions/side effects.    patient was instructed, not to drive, operate heavy machinery, perform activities at heights, swimming or participation in water activities or provide baby-sitting services while on Pain, Sleep and Anxiety Medications; until their outpatient Physician has advised to do so again. Also recommended to not to take more than prescribed Pain, Sleep and Anxiety Medications.  It is not advisable to combine anxiety, sleep and pain medications without talking with your primary care provider.     Wear Seat belts while driving.   Please note: You were cared for by a hospitalist during your hospital stay. Once you are discharged, your primary care physician will handle any further medical issues. Please note that NO REFILLS for any discharge medications will be authorized once you are discharged, as it is imperative that you return to your primary care physician (or establish a relationship with a primary care physician if you do not have one) for your post hospital discharge needs so that they can reassess your need for medications and monitor your lab values  Time coordinating discharge: Over 30 minutes  SIGNED:   Fredia Skeeter, MD  [facility] 05/13/2024, 8:26 AM *Please note that this is a verbal dictation therefore  any spelling or grammatical errors are due to the [employer] system interpretation. If 7PM-7AM, please contact night-coverage [email or web address]     [1] No Known Allergies

## 2024-05-13 NOTE — H&P (Signed)
 " History and Physical    Martin Crawford FMW:[id number] DOB: 22-Dec-1975 DOA: 05/12/2024  PCP: Pcp, No   Patient coming from: Home   Chief Complaint: Pleuritic chest pain, cough, loss of appetite   HPI: Martin Crawford is a 49 y.o. male with medical history significant for type 2 diabetes mellitus, asthma, and pilonidal cyst drainage who presents with cough, loss of appetite, and pleuritic chest pain.  Patient reports that he has been experiencing a loss of appetite and worsening cough.  Symptoms became worse after he donated plasma today and he has developed pleuritic chest pain.  He denies abdominal pain, vomiting, or diarrhea.  EMS found the patient to have a blood pressure of 74/54 and administered 1200 mL of IVF, Zofran , and 324 mg aspirin prior to arrival in the ED.  ED Course: Upon arrival to the ED, patient is found to be afebrile and saturating low 90s on supplemental oxygen with tachypnea, normal HR, and SBP 80 and greater.  Labs are most notable for creatinine 1.8, potassium 6.2, WBC 32.2, hemoglobin 17.2, and lactate 3.7.  CTA chest is negative for PE but concerning for right lower lobe pneumonia.  Blood cultures were collected in the ED and the patient was given a liter of LR, Rocephin , and azithromycin .  Review of Systems:  All other systems reviewed and apart from HPI, are negative.  Past Medical History:  Diagnosis Date   Asthma    Chronic back pain    Diabetes mellitus type II, non insulin  dependent (HCC) 07/13/2020   History of frequent abscesses of skin and subcutaneous tissue 07/13/2020   Pilonidal abscess 09/22/2016    Past Surgical History:  Procedure Laterality Date   INCISION AND DRAINAGE ABSCESS N/A 09/23/2016   Procedure: INCISION AND DRAINAGE ABSCESS PILONIDAL ABCESS;  Surgeon: Mikell Katz, MD;  Location: [facility];  Service: General;  Laterality: N/A;   INCISION AND DRAINAGE ABSCESS  08/02/2013   buttock abscess - I&D in ED   INCISION AND DRAINAGE  ABSCESS N/A 07/13/2020   Procedure: ERASMO UNDER ANESTHESIA/EXCISION AND DRAINAGE PERINEAL ABSCESS;  Surgeon: Dasie Leonor CROME, MD;  Location: [facility];  Service: General;  Laterality: N/A;   PILONIDAL CYST DRAINAGE  09/23/2016   pilonidal abscess    Social History:   reports that he has been smoking cigarettes. He has a 28 pack-year smoking history. He has never used smokeless tobacco. He reports that he does not drink alcohol and does not use drugs.  Allergies[1]  Family History  Problem Relation Age of Onset   Stroke Mother    Hypertension Mother    Asthma Father    Hypertension Father      Prior to Admission medications  Not on File    Physical Exam: Vitals:   05/12/24 2030 05/12/24 2245 05/12/24 2350 05/13/24 0112  BP: 115/78 112/88 124/86 (!) 149/95  Pulse:  71 71   Resp: (!) 22 (!) 27 18 (!) 27  Temp:  97.8 F (36.6 C) 97.8 F (36.6 C)   TempSrc:  Oral Oral   SpO2:  91% 96%     Constitutional: NAD, no pallor   Eyes: PERTLA, lids and conjunctivae normal ENMT: Mucous membranes are moist. Posterior pharynx clear of any exudate or lesions.   Neck: supple, no masses  Respiratory: Coarse rales on right. No wheezing.  Cardiovascular: S1 & S2 heard, regular rate and rhythm. No extremity edema.  Abdomen: No tenderness, soft. Bowel sounds active.  Musculoskeletal: no clubbing / cyanosis. No  joint deformity upper and lower extremities.   Skin: no significant rashes, lesions, ulcers. Warm, dry, well-perfused. Neurologic: CN 2-12 grossly intact. Moving all extremities. Alert and oriented.  Psychiatric: Calm. Cooperative.    Labs and Imaging on Admission: I have personally reviewed following labs and imaging studies  CBC: Recent Labs  Lab 05/12/24 1552 05/12/24 1620  WBC 32.2*  --   NEUTROABS 28.1*  --   HGB 17.2* 18.0*  HCT 51.1 53.0*  MCV 65.4*  --   PLT 200  --    Basic Metabolic Panel: Recent Labs  Lab 05/12/24 1620  NA 139  K 6.2*  CL 106  GLUCOSE 128*   BUN 13  CREATININE 1.80*   GFR: CrCl cannot be calculated (Unknown ideal weight.). Liver Function Tests: No results for input(s): AST, ALT, ALKPHOS, BILITOT, PROT, ALBUMIN in the last 168 hours. No results for input(s): LIPASE, AMYLASE in the last 168 hours. No results for input(s): AMMONIA in the last 168 hours. Coagulation Profile: Recent Labs  Lab 05/12/24 1552  INR 1.1   Cardiac Enzymes: Recent Labs  Lab 05/12/24 1702  CKTOTAL 143   BNP (last 3 results) Recent Labs    05/12/24 1552  PROBNP <50.0   HbA1C: No results for input(s): HGBA1C in the last 72 hours. CBG: No results for input(s): GLUCAP in the last 168 hours. Lipid Profile: No results for input(s): CHOL, HDL, LDLCALC, TRIG, CHOLHDL, LDLDIRECT in the last 72 hours. Thyroid  Function Tests: No results for input(s): TSH, T4TOTAL, FREET4, T3FREE, THYROIDAB in the last 72 hours. Anemia Panel: No results for input(s): VITAMINB12, FOLATE, FERRITIN, TIBC, IRON, RETICCTPCT in the last 72 hours. Urine analysis:    Component Value Date/Time   COLORURINE YELLOW 07/16/2021 1628   APPEARANCEUR CLEAR 07/16/2021 1628   LABSPEC 1.017 07/16/2021 1628   PHURINE 5.0 07/16/2021 1628   GLUCOSEU NEGATIVE 07/16/2021 1628   HGBUR SMALL (A) 07/16/2021 1628   BILIRUBINUR NEGATIVE 07/16/2021 1628   KETONESUR NEGATIVE 07/16/2021 1628   PROTEINUR NEGATIVE 07/16/2021 1628   UROBILINOGEN 2.0 (H) 08/02/2013 0045   NITRITE NEGATIVE 07/16/2021 1628   LEUKOCYTESUR NEGATIVE 07/16/2021 1628   Sepsis Labs: @LABRCNTIP (procalcitonin:4,lacticidven:4) ) Recent Results (from the past 240 hours)  Resp panel by RT-PCR (RSV, Flu A&B, Covid) Anterior Nasal Swab     Status: None   Collection Time: 05/12/24  6:34 PM   Specimen: Anterior Nasal Swab  Result Value Ref Range Status   SARS Coronavirus 2 by RT PCR NEGATIVE NEGATIVE Final   Influenza A by PCR NEGATIVE NEGATIVE Final    Influenza B by PCR NEGATIVE NEGATIVE Final    Comment: (NOTE) The Xpert Xpress SARS-CoV-2/FLU/RSV plus assay is intended as an aid in the diagnosis of influenza from Nasopharyngeal swab specimens and should not be used as a sole basis for treatment. Nasal washings and aspirates are unacceptable for Xpert Xpress SARS-CoV-2/FLU/RSV testing.  Fact Sheet for Patients: bloggercourse.com  Fact Sheet for Healthcare Providers: seriousbroker.it  This test is not yet approved or cleared by the [place]  FDA and has been authorized for detection and/or diagnosis of SARS-CoV-2 by FDA under an Emergency Use Authorization (EUA). This EUA will remain in effect (meaning this test can be used) for the duration of the COVID-19 declaration under Section 564(b)(1) of the Act, 21 U.S.C. section 360bbb-3(b)(1), unless the authorization is terminated or revoked.     Resp Syncytial Virus by PCR NEGATIVE NEGATIVE Final    Comment: (NOTE) Fact Sheet for Patients: bloggercourse.com  Fact  Sheet for Healthcare Providers: seriousbroker.it  This test is not yet approved or cleared by the [place]  FDA and has been authorized for detection and/or diagnosis of SARS-CoV-2 by FDA under an Emergency Use Authorization (EUA). This EUA will remain in effect (meaning this test can be used) for the duration of the COVID-19 declaration under Section 564(b)(1) of the Act, 21 U.S.C. section 360bbb-3(b)(1), unless the authorization is terminated or revoked.  Performed at [facility], [address], [place], [place] [address]      Radiological Exams on Admission: CT Angio Chest PE W/Cm &/Or Wo Cm Result Date: 05/12/2024 EXAM: CTA of the Chest with contrast for PE 05/12/2024 09:00:00 PM TECHNIQUE: CTA of the chest was performed with the administration of 60 mL of iohexol  (OMNIPAQUE ) 350 MG/ML injection.  Multiplanar reformatted images are provided for review. MIP images are provided for review. Automated exposure control, iterative reconstruction, and/or weight based adjustment of the mA/kV was utilized to reduce the radiation dose to as low as reasonably achievable. COMPARISON: None available. CLINICAL HISTORY: Pulmonary embolism suspected, high probability. Chest pain, hypotension. FINDINGS: PULMONARY ARTERIES: Pulmonary arteries are adequately opacified for evaluation. No pulmonary embolism. Mildly enlarged main pulmonary artery, correlate for pulmonary hypertension. MEDIASTINUM: The heart and pericardium demonstrate no acute abnormality. There is no acute abnormality of the thoracic aorta. LYMPH NODES: No mediastinal, hilar or axillary lymphadenopathy. LUNGS AND PLEURA: Multifocal patchy opacities in the lungs bilaterally, predominantly subpleural, favoring postinfectious or inflammatory scarring. However, there is superimposed patchy opacity in the right lower lobe, suggesting mild infection/pneumonia. No pleural effusion or pneumothorax. UPPER ABDOMEN: Limited images of the upper abdomen are unremarkable. SOFT TISSUES AND BONES: No acute bone or soft tissue abnormality. IMPRESSION: 1. No evidence of pulmonary embolism. 2. Patchy right lower lobe airspace opacity, suspicious for mild infection/pneumonia. Suspected underlying postinfectious/inflammatory scarring. 3. Possible pulmonary hypertension. Electronically signed by: Pinkie Pebbles MD 05/12/2024 09:15 PM EST RP Workstation: [id number]   DG Chest Port 1 View Result Date: 05/12/2024 CLINICAL DATA:  Chest pain EXAM: PORTABLE CHEST 1 VIEW COMPARISON:  01/14/2017 FINDINGS: Mild cardiomegaly. No pleural effusion. Central vascular congestion and mild diffuse interstitial opacity, greatest in the lower lung zones. IMPRESSION: Cardiomegaly with central vascular congestion and mild diffuse interstitial opacity, this could be due to mild interstitial edema  versus interstitial inflammatory process/infection. Electronically Signed   By: Luke Bun M.D.   On: 05/12/2024 15:44    EKG: Independently reviewed. Sinus rhythm, RAD.   Assessment/Plan   1. Pneumonia; acute hypoxic respiratory failure  - Blood cultures collected in ED and IVF and empiric antibiotics started  - Continue Rocephin  and azithromycin , culture sputum, check strep pneumo and legionella antigens, trend lactate    2. AKI; hyperkalemia  - SCr is 1.80 on admission, up from apparent baseline of <1; potassium is 6.2  - Likely prerenal in setting of loss of appetite and hypotension; he has been fluid resuscitated in ED  - Continue IVF hydration, continue cardiac monitoring, renally-dose medications, repeat chem panel   3. Asthma  - Not in exacerbation  - Albuterol  as-needed    4. Type II DM  - A1c was 7.4% in March 2024; he is diet-controlled at home - Check CBGs and use low-intensity SSI if needed     DVT prophylaxis: Lovenox   Code Status: Full  Level of Care: Level of care: Progressive Family Communication: Wife at bedside   Disposition Plan:  Patient is from: Home   Anticipated d/c is to: Home  Anticipated d/c date is: 05/14/24 Patient currently: Pending clinical improvement  Consults called: None  Admission status: Inpatient     Evalene GORMAN Sprinkles, MD [facility]  05/13/2024, 3:11 AM       [1] No Known Allergies  "

## 2024-05-14 LAB — LEGIONELLA PNEUMOPHILA SEROGP 1 UR AG: L. pneumophila Serogp 1 Ur Ag: NEGATIVE

## 2024-05-17 LAB — CULTURE, BLOOD (ROUTINE X 2)
Culture: NO GROWTH
Culture: NO GROWTH
Special Requests: ADEQUATE
Special Requests: ADEQUATE
